# Patient Record
Sex: Male | Born: 1955
Health system: Southern US, Community
[De-identification: ages and names within clinical notes are randomized; demographics above are authoritative.]

## PROBLEM LIST (undated history)

## (undated) DIAGNOSIS — G4730 Sleep apnea, unspecified: Secondary | ICD-10-CM

## (undated) DIAGNOSIS — Z9989 Dependence on other enabling machines and devices: Secondary | ICD-10-CM

## (undated) DIAGNOSIS — I700 Atherosclerosis of aorta: Secondary | ICD-10-CM

## (undated) DIAGNOSIS — I4891 Unspecified atrial fibrillation: Secondary | ICD-10-CM

## (undated) DIAGNOSIS — I499 Cardiac arrhythmia, unspecified: Secondary | ICD-10-CM

## (undated) DIAGNOSIS — K219 Gastro-esophageal reflux disease without esophagitis: Secondary | ICD-10-CM

## (undated) DIAGNOSIS — N529 Male erectile dysfunction, unspecified: Secondary | ICD-10-CM

## (undated) DIAGNOSIS — I10 Essential (primary) hypertension: Secondary | ICD-10-CM

## (undated) DIAGNOSIS — M1990 Unspecified osteoarthritis, unspecified site: Secondary | ICD-10-CM

## (undated) DIAGNOSIS — E785 Hyperlipidemia, unspecified: Secondary | ICD-10-CM

## (undated) DIAGNOSIS — T7840XA Allergy, unspecified, initial encounter: Secondary | ICD-10-CM

## (undated) DIAGNOSIS — R011 Cardiac murmur, unspecified: Secondary | ICD-10-CM

## (undated) HISTORY — DX: Gastro-esophageal reflux disease without esophagitis: K21.9

## (undated) HISTORY — DX: Dependence on other enabling machines and devices: Z99.89

## (undated) HISTORY — DX: Unspecified atrial fibrillation: I48.91

## (undated) HISTORY — DX: Sleep apnea, unspecified: G47.30

## (undated) HISTORY — PX: VARICOCELE EXCISION: SUR582

## (undated) HISTORY — DX: Cardiac murmur, unspecified: R01.1

## (undated) HISTORY — DX: Hyperlipidemia, unspecified: E78.5

## (undated) HISTORY — DX: Atherosclerosis of aorta: I70.0

## (undated) HISTORY — DX: Male erectile dysfunction, unspecified: N52.9

## (undated) HISTORY — DX: Unspecified osteoarthritis, unspecified site: M19.90

## (undated) HISTORY — DX: Allergy, unspecified, initial encounter: T78.40XA

## (undated) HISTORY — DX: Essential (primary) hypertension: I10

---

## 2005-07-14 ENCOUNTER — Ambulatory Visit: Payer: Self-pay | Admitting: Gastroenterology

## 2005-07-14 LAB — HM COLONOSCOPY

## 2009-06-16 ENCOUNTER — Ambulatory Visit: Payer: Self-pay | Admitting: Family Medicine

## 2009-06-16 ENCOUNTER — Emergency Department: Payer: Self-pay | Admitting: Emergency Medicine

## 2009-06-23 ENCOUNTER — Ambulatory Visit: Payer: Self-pay | Admitting: Internal Medicine

## 2010-12-02 ENCOUNTER — Ambulatory Visit: Payer: Self-pay | Admitting: Gastroenterology

## 2012-04-20 ENCOUNTER — Ambulatory Visit: Payer: Self-pay | Admitting: Orthopedic Surgery

## 2012-08-31 ENCOUNTER — Inpatient Hospital Stay: Payer: Self-pay | Admitting: Internal Medicine

## 2012-08-31 LAB — MAGNESIUM: Magnesium: 1.9 mg/dL

## 2012-08-31 LAB — CBC
HCT: 45.5 % (ref 40.0–52.0)
HGB: 15.4 g/dL (ref 13.0–18.0)
MCH: 30.2 pg (ref 26.0–34.0)
MCHC: 33.8 g/dL (ref 32.0–36.0)
MCV: 89 fL (ref 80–100)
Platelet: 156 10*3/uL (ref 150–440)
RBC: 5.11 10*6/uL (ref 4.40–5.90)
RDW: 13.2 % (ref 11.5–14.5)
WBC: 5.5 10*3/uL (ref 3.8–10.6)

## 2012-08-31 LAB — URINALYSIS, COMPLETE
Bacteria: NONE SEEN
Bilirubin,UR: NEGATIVE
Glucose,UR: NEGATIVE mg/dL (ref 0–75)
Ketone: NEGATIVE
Nitrite: NEGATIVE
Ph: 5 (ref 4.5–8.0)
Protein: NEGATIVE
RBC,UR: 2 /HPF (ref 0–5)
Specific Gravity: 1.009 (ref 1.003–1.030)
Squamous Epithelial: <1
WBC UR: 6 /HPF (ref 0–5)

## 2012-08-31 LAB — COMPREHENSIVE METABOLIC PANEL
Albumin: 3.9 g/dL (ref 3.4–5.0)
Alkaline Phosphatase: 103 U/L (ref 50–136)
Anion Gap: 5 — ABNORMAL LOW (ref 7–16)
BUN: 16 mg/dL (ref 7–18)
Bilirubin,Total: 0.8 mg/dL (ref 0.2–1.0)
Calcium, Total: 8.7 mg/dL (ref 8.5–10.1)
Chloride: 107 mmol/L (ref 98–107)
Co2: 27 mmol/L (ref 21–32)
Creatinine: 1.25 mg/dL (ref 0.60–1.30)
EGFR (African American): >60
EGFR (Non-African Amer.): >60
Glucose: 128 mg/dL — ABNORMAL HIGH (ref 65–99)
Osmolality: 280 (ref 275–301)
Potassium: 3.6 mmol/L (ref 3.5–5.1)
SGOT(AST): 29 U/L (ref 15–37)
SGPT (ALT): 48 U/L (ref 12–78)
Sodium: 139 mmol/L (ref 136–145)
Total Protein: 7.6 g/dL (ref 6.4–8.2)

## 2012-08-31 LAB — PHOSPHORUS: Phosphorus: 2.8 mg/dL (ref 2.5–4.9)

## 2012-08-31 LAB — TROPONIN I
Troponin-I: <0.02 ng/mL
Troponin-I: <0.02 ng/mL

## 2012-08-31 LAB — TSH: Thyroid Stimulating Horm: 1.57 u[IU]/mL

## 2012-08-31 LAB — CK TOTAL AND CKMB (NOT AT ARMC)
CK, Total: 77 U/L (ref 35–232)
CK-MB: <0.5 ng/mL — ABNORMAL LOW (ref 0.5–3.6)

## 2012-08-31 LAB — PROTIME-INR
INR: 1.1
Prothrombin Time: 13.9 secs (ref 11.5–14.7)

## 2012-09-01 LAB — LIPID PANEL
Cholesterol: 142 mg/dL (ref 0–200)
HDL Cholesterol: 29 mg/dL — ABNORMAL LOW (ref 40–60)
Ldl Cholesterol, Calc: 90 mg/dL (ref 0–100)
Triglycerides: 116 mg/dL (ref 0–200)
VLDL Cholesterol, Calc: 23 mg/dL (ref 5–40)

## 2012-09-01 LAB — CBC WITH DIFFERENTIAL/PLATELET
Basophil #: 0.1 10*3/uL (ref 0.0–0.1)
Basophil %: 0.9 %
Eosinophil #: 0.2 10*3/uL (ref 0.0–0.7)
Eosinophil %: 2.9 %
HCT: 44.4 % (ref 40.0–52.0)
HGB: 15 g/dL (ref 13.0–18.0)
Lymphocyte #: 2.1 10*3/uL (ref 1.0–3.6)
Lymphocyte %: 33.1 %
MCH: 30.3 pg (ref 26.0–34.0)
MCHC: 33.8 g/dL (ref 32.0–36.0)
MCV: 90 fL (ref 80–100)
Monocyte #: 0.8 x10 3/mm (ref 0.2–1.0)
Monocyte %: 13.3 %
Neutrophil #: 3.1 10*3/uL (ref 1.4–6.5)
Neutrophil %: 49.8 %
Platelet: 165 10*3/uL (ref 150–440)
RBC: 4.96 10*6/uL (ref 4.40–5.90)
RDW: 13 % (ref 11.5–14.5)
WBC: 6.3 10*3/uL (ref 3.8–10.6)

## 2012-09-01 LAB — BASIC METABOLIC PANEL
Anion Gap: 6 — ABNORMAL LOW (ref 7–16)
BUN: 17 mg/dL (ref 7–18)
Calcium, Total: 8.7 mg/dL (ref 8.5–10.1)
Chloride: 106 mmol/L (ref 98–107)
Co2: 27 mmol/L (ref 21–32)
Creatinine: 1.19 mg/dL (ref 0.60–1.30)
EGFR (African American): >60
EGFR (Non-African Amer.): >60
Glucose: 108 mg/dL — ABNORMAL HIGH (ref 65–99)
Osmolality: 280 (ref 275–301)
Potassium: 3.9 mmol/L (ref 3.5–5.1)
Sodium: 139 mmol/L (ref 136–145)

## 2012-09-01 LAB — MAGNESIUM: Magnesium: 2 mg/dL

## 2012-09-01 LAB — TROPONIN I: Troponin-I: <0.02 ng/mL

## 2013-02-07 DIAGNOSIS — E785 Hyperlipidemia, unspecified: Secondary | ICD-10-CM | POA: Insufficient documentation

## 2013-02-07 DIAGNOSIS — R030 Elevated blood-pressure reading, without diagnosis of hypertension: Secondary | ICD-10-CM | POA: Insufficient documentation

## 2013-02-07 DIAGNOSIS — K219 Gastro-esophageal reflux disease without esophagitis: Secondary | ICD-10-CM | POA: Insufficient documentation

## 2013-02-18 HISTORY — PX: ABLATION: SHX5711

## 2013-03-12 DIAGNOSIS — G4733 Obstructive sleep apnea (adult) (pediatric): Secondary | ICD-10-CM | POA: Insufficient documentation

## 2014-06-14 ENCOUNTER — Ambulatory Visit: Payer: Self-pay | Admitting: Emergency Medicine

## 2014-07-16 LAB — PSA

## 2014-10-10 NOTE — Discharge Summary (Signed)
PATIENT NAME:  Thomas Mercado, Thomas Mercado MR#:  [id number] DATE OF BIRTH:  May 10, 1956  DATE OF ADMISSION:  08/31/2012 DATE OF DISCHARGE:  09/02/2012  PRIMARY CARE PHYSICIAN:  Dr. Golden Pop.   PRIMARY CARDIOLOGIST: Dr. Nehemiah Massed.   DISCHARGE DIAGNOSES: 1.  Atrial fibrillation/flutter with rapid ventricular rate.  2.  Hypertension.   CONSULTANTS: Dr. Ubaldo Glassing of cardiology.   IMAGING STUDIES: Include a chest x-ray showed no acute cardiopulmonary disease.   ADMITTING HISTORY AND PHYSICAL: Please see detailed H and P dictated on 08/31/2012. In brief, this is a 59 year old male patient with past history of atrial fibrillation, bradycardia, who presented to the hospital complaining of lightheadedness and dizziness. He was found to have rapid ventricular rate at 80 with A. fib/A. flutter. He was admitted to the hospitalist service on a Cardizem drip to CCU.    HOSPITAL COURSE:  Atrial fibrillation/flutter. Patient was continued on a Cardizem drip, later transitioned to Cardizem 120 mg oral once a day. Reviewing his outpatient records with Dr. Ubaldo Glassing, it was found that he had episodes of bradycardia on Cardizem of 240, but presently the patient has been started on Cardizem 120 and he has no episodes of bradycardia. On further discussion with Dr. Ubaldo Glassing, the patient is being discharged home on a Holter for 48 hours on flecainide  and Cardizem to follow up with Dr. Nehemiah Massed. On the day of discharge, the patient was ambulated on telemetry, with his heart rate being less than 110 and is being discharged home in fair condition. His CHADS score is zero and he is on aspirin for his anticoagulation.   On the day of discharge, patient's cardiac examination showed irregular heart rate, with blood pressure 132/89, pulse 74 at rest and saturating 98% on room air.   DISCHARGE MEDICATIONS: 1.  Aspirin 81 mg oral once a day.  2.  Flonase 50 mcg two sprays nasal once a day.  3.  Nexium 40 mg oral once a day.  4.  Flecainide 100 mg  oral 2 times a day.  5.  Pravastatin 20 mg oral once a day.  6.  Diltiazem 120 mg oral once a day long-acting.   DISCHARGE INSTRUCTIONS:  Cardiac diet. Activity as tolerated. Follow up with Dr. Nehemiah Massed in 1 to 2 days as outpatient.   TIME SPENT: On day of discharge in discharge activity was 42 minutes.    ____________________________ Leia Alf Karessa Onorato, MD srs:cc D: 09/02/2012 13:59:59 ET T: 09/02/2012 22:33:40 ET JOB#: [id number]  cc: Alveta Heimlich R. Darvin Neighbours, MD, <Dictator> Corey Skains, MD Guadalupe Maple, MD Neita Carp MD ELECTRONICALLY SIGNED 09/06/2012 13:20

## 2014-10-10 NOTE — H&P (Signed)
PATIENT NAME:  Thomas Mercado, Thomas Mercado MR#:  [id number] DATE OF BIRTH:  01-06-1956  DATE OF ADMISSION:  08/31/2012  PRIMARY CARE PHYSICIAN: Dr. Golden Pop.   PRIMARY CARDIOLOGIST: Dr. Serafina Royals.   PRESENTING COMPLAINT: Lightheaded and dizziness   HISTORY OF PRESENTING ILLNESS: This is a 59 year old male with past medical history of atrial fibrillation, sinus allergies, reflux disease, hypercholesterolemia. Has been diagnosed with atrial fibrillation almost 2 years ago when he was feeling weak and dizzy and followed with cardiologist, Dr. Nehemiah Massed, who initially put him on some kind of rate control medication, but then he still had complaint of feeling dizzy a few weeks ago, so he did a Holter monitor and he found his heart is going sometimes brady and sometimes tachycardic and so started on flecainide 3 weeks ago. Yesterday in the night while the patient was walking from one room to the other, he felt extremely lightheaded and fell down. He never lost consciousness. He just tried to get up and started walking again. There was not any complaint of palpitations, chest pain or loss of consciousness, feeling focal weakness, tingling, numbness or tremors with this episode. He decided to go to his primary care physician today because he was feeling lightheaded. He went to the physician's office and they were unable to check his blood pressure, as it was running very low and his heart was fast, so they sent him to Emergency Room. On arrival to ER, he was found having atrial flutter with ventricular response of up to 130 to 140. Gave him IV diltiazem, 2 doses, but still were unable to control his heart rate and so finally started him on diltiazem IV drip, and he is being admitted for further management.   REVIEW OF SYSTEMS:  CONSTITUTIONAL: Negative for fever, fatigue, weakness, pain or weight loss. Positive for lightheadedness. EYES: No blurring, double vision, pain or redness.  EARS, NOSE, THROAT: No tinnitus,  ear pain, hearing loss or discharge.  RESPIRATORY: No cough, wheezing, hemoptysis.  CARDIOVASCULAR: No chest pain, orthopnea, edema or palpitations, but had episode of  presyncopal event and lightheadedness.  GASTROINTESTINAL: No nausea, vomiting, diarrhea, abdominal pain or constipation.  GENITOURINARY: No dysuria, hematuria, or increased frequency of the urination.  ENDOCRINE: No heat or cold intolerance.  SKIN: No rashes.  MUSCULOSKELETAL: No pain or swelling in the joints.  NEUROLOGICAL: No numbness, weakness, tremors, but had lightheadedness and episode of fall once.  PSYCHIATRIC: No anxiety, insomnia or depression.   PAST MEDICAL HISTORY: Atrial fibrillation, hyperlipidemia, reflux disease and allergic sinusitis.   PAST SURGICAL HISTORY: None.   SOCIAL HISTORY: Works as a Freight forwarder in [employer]. He denies smoking. Drinking alcohol occasionally. No illegal drug use.   FAMILY HISTORY: Positive for breast cancer in mother. High blood pressure, rheumatoid arthritis and Alzheimer's in father. Brother has emphysema.   HOME MEDICATIONS: Aspirin 81 mg once a day, Flonase 50 mcg 2 sprays once a day, Nexium 40 mg once a day, flecainide 100 mg twice daily, pravastatin 20 mg once a day.  ALLERGIES: PENICILLIN.  PHYSICAL EXAMINATION:  VITAL SIGNS: Temperature 97.3, pulse rate 150 to 130, blood pressure 115/90 and pulse oximetry 96 on room air.  GENERAL: Fully alert, oriented to time, place and person, in no acute distress. He is cooperative with history taking and physical examination.  HEENT: Head and neck atraumatic. Conjunctivae pink. Oral mucosa moist.  NECK: Supple. No JVD.  CARDIOVASCULAR: S1, S2 present. Irregular and tachycardia. No murmur appreciated due to tachycardia.  RESPIRATORY: Bilateral clear  and equal air entry.  ABDOMEN: Soft, nontender. Bowel sounds present. No organomegaly.  SKIN: No rashes.  MUSCULOSKELETAL: Joints: No swelling or tenderness.  EXTREMITIES: Legs: No  edema.  NEUROLOGICAL: Power 5/5 all 4 limbs. Follows commands. No tremor.  PSYCHIATRIC: Does not appear in any acute psychiatric illness at this time.   LABORATORY, DIAGNOSTIC AND RADIOLOGICAL DATA: Glucose 128, BUN 16, creatinine 1.25, sodium 139, potassium 3.6, chloride 107, CO2 of 27, phosphorus 2.8, magnesium 1.9, calcium 8.7. CK 77, CK-MB less than 0.05, troponin less than 0.02. Thyroid-stimulating hormone 1.57. WBC 5.5, hemoglobin 15.4, platelet count 156, MCV 89. INR 1.1. Chest x-ray: No acute cardiopulmonary abnormality.   ASSESSMENT AND PLAN: A 59 year old male with known history of atrial fibrillation and hyperlipidemia, on flecainide, came after having lightheadedness and fall once and went to primary medical doctor's office, found having hypotension and tachycardia with atrial fibrillation and sent to Emergency Room.  1.  Atrial fibrillation with ventricular response: Emergency Room physician tried giving diltiazem IV push x 2 but no response. He was still tachycardic to 120 to 130 in atrial fibrillation, so started on diltiazem drip now. Will admit him in Critical Care Unit with diltiazem drip and monitor for his cardiac rhythm. He was on flecainide by Dr. Nehemiah Massed, but will wait for cardiology to come and decide what medicine they want to start him on now once the heart rate comes under control. His CHADS2 score is low, so he is not a candidate for anticoagulation at this time. Troponin level is negative. Will follow troponin x 3. His cardiac workup with echocardiogram and stress test as per the patient was done with Dr. Nehemiah Massed in the clinic, so I will not order any further workup at this time, wait for the cardiology input and what they want to do.  2.  Hyperlipidemia: Will resume pravastatin.  3.  Gastroesophageal reflux disease: Will continue him on pantoprazole tablet.  4.  Sinus allergic reactions: Will continue him on Flonase nasal spray. 5.  Deep vein thrombosis prophylaxis with  heparin subcutaneous. Gastrointestinal prophylaxis with pantoprazole oral.   CODE STATUS: Full code.   TOTAL TIME SPENT IN CRITICAL CARE: 50 minutes.   ____________________________ Ceasar Lund Anselm Jungling, MD [id number] D: 08/31/2012 16:55:50 ET T: 08/31/2012 19:08:27 ET JOB#: [id number]  cc: Ceasar Lund. Anselm Jungling, MD, <Dictator> Guadalupe Maple, MD Corey Skains, MD Vaughan Basta MD ELECTRONICALLY SIGNED 09/17/2012 9:34

## 2014-10-10 NOTE — Consult Note (Signed)
   General Aspect 59 yo male with history of afib who presented to his pcp with complaints of sinusitis and was noted to be in afib/flutter with rvr. He had been treated with cardizem cd 240 mg daily as an outpatient with appartent conversion to sinus rhythm but also had episodes of bradycardia. He was taken off of the cardizem and placed on flecainide at 100 mg twice daily. He was noted to have atrial flutter on monitor. He was placed on a cardizem drip after several bouses. He has ruled out for an mi and is hemodynamically stable. This am he is intermitantly in aflutter with 2:1 conduction, afib with controlled vr  and sinus rhythm at rate of 60. He is asymptomatic and hemodynamically stable. He is on asa. His CHADSS score is 0.   Physical Exam:  GEN well developed, well nourished, no acute distress   HEENT PERRL, hearing intact to voice   NECK supple   RESP normal resp effort  clear BS   CARD Irregular rate and rhythm  No murmur   ABD denies tenderness  no liver/spleen enlargement   LYMPH negative neck, negative axillae   EXTR negative cyanosis/clubbing, negative edema   SKIN normal to palpation   NEURO cranial nerves intact, motor/sensory function intact   PSYCH A+O to time, place, person   Review of Systems:  Subjective/Chief Complaint sinus congestion   General: No Complaints   Skin: No Complaints   ENT: No Complaints   Eyes: No Complaints   Neck: No Complaints   Respiratory: No Complaints   Cardiovascular: Palpitations   Gastrointestinal: No Complaints   Genitourinary: No Complaints   Vascular: No Complaints   Musculoskeletal: No Complaints   Neurologic: No Complaints   Hematologic: No Complaints   Endocrine: No Complaints   Psychiatric: No Complaints   Review of Systems: All other systems were reviewed and found to be negative   Medications/Allergies Reviewed Medications/Allergies reviewed   EKG:  Interpretation aflutter/afib/nsr     Penicillin: Unknown   Impression 59 yo male with history of afib admitted with aflutter with rvr. Has been on a cardizem drip and has converted to afib with variable response and intermittant nsr. Is on flecainide 100 bid and rate is controlled with cardizem. Had bradycardia with cardizem 240 mg daiily in the past. Will wean off of iv cardizem to cardizem xt 120 mg daily. Continue with asa. Ambualte today and if stable consider discharge on flecainide 100 bid; cardizem cd 120 mg daily; cardizem 30 mg po prn breakthrough and asa. Follow up with dr Nehemiah Massed next week as scheduled.   Plan 1. Wean off of cardizem drip 2. Cardizem cd 120 mg daily 3. Cardizem 30 mg prn breakthrough 4. Continue with asa 81 mg daily 5. Ambulate today and if stable discharge with 48 hour holter monitor 6. Follow up with dr Nehemiah Massed on tuesday of next week as scheduled   Electronic Signatures: Teodoro Spray (MD)  (Signed 15-Mar-14 09:23)  Authored: General Aspect/Present Illness, History and Physical Exam, Review of System, EKG , Allergies, Impression/Plan   Last Updated: 15-Mar-14 09:23 by Teodoro Spray (MD)

## 2015-01-29 ENCOUNTER — Other Ambulatory Visit: Payer: Self-pay | Admitting: Orthopedic Surgery

## 2015-01-29 DIAGNOSIS — M25562 Pain in left knee: Secondary | ICD-10-CM

## 2015-02-03 ENCOUNTER — Ambulatory Visit
Admission: RE | Admit: 2015-02-03 | Discharge: 2015-02-03 | Disposition: A | Discharge status: Home / Self Care | Payer: Managed Care, Other (non HMO) | Source: Ambulatory Visit | Attending: Orthopedic Surgery | Admitting: Orthopedic Surgery

## 2015-02-03 DIAGNOSIS — M1712 Unilateral primary osteoarthritis, left knee: Secondary | ICD-10-CM | POA: Insufficient documentation

## 2015-02-03 DIAGNOSIS — X58XXXA Exposure to other specified factors, initial encounter: Secondary | ICD-10-CM | POA: Insufficient documentation

## 2015-02-03 DIAGNOSIS — S83242A Other tear of medial meniscus, current injury, left knee, initial encounter: Secondary | ICD-10-CM | POA: Diagnosis not present

## 2015-02-03 DIAGNOSIS — M25562 Pain in left knee: Secondary | ICD-10-CM

## 2015-02-23 ENCOUNTER — Other Ambulatory Visit: Payer: Self-pay | Admitting: Family Medicine

## 2015-02-23 NOTE — Telephone Encounter (Signed)
Needs seen further refills 

## 2015-03-09 NOTE — Telephone Encounter (Signed)
Pt scheduled for med fu on 03/11/15 @ 10:30. Thanks.

## 2015-03-10 DIAGNOSIS — J309 Allergic rhinitis, unspecified: Secondary | ICD-10-CM | POA: Insufficient documentation

## 2015-03-10 DIAGNOSIS — N529 Male erectile dysfunction, unspecified: Secondary | ICD-10-CM | POA: Insufficient documentation

## 2015-03-10 DIAGNOSIS — I4891 Unspecified atrial fibrillation: Secondary | ICD-10-CM | POA: Insufficient documentation

## 2015-03-10 DIAGNOSIS — K760 Fatty (change of) liver, not elsewhere classified: Secondary | ICD-10-CM

## 2015-03-10 DIAGNOSIS — R011 Cardiac murmur, unspecified: Secondary | ICD-10-CM | POA: Insufficient documentation

## 2015-03-10 DIAGNOSIS — I10 Essential (primary) hypertension: Secondary | ICD-10-CM

## 2015-03-10 DIAGNOSIS — E291 Testicular hypofunction: Secondary | ICD-10-CM | POA: Insufficient documentation

## 2015-03-11 ENCOUNTER — Ambulatory Visit (INDEPENDENT_AMBULATORY_CARE_PROVIDER_SITE_OTHER): Payer: Managed Care, Other (non HMO) | Admitting: Unknown Physician Specialty

## 2015-03-11 ENCOUNTER — Encounter: Payer: Self-pay | Admitting: Unknown Physician Specialty

## 2015-03-11 VITALS — BP 136/94 | HR 79 | Temp 98.3°F | Ht 75.1 in | Wt 274.4 lb

## 2015-03-11 DIAGNOSIS — K219 Gastro-esophageal reflux disease without esophagitis: Secondary | ICD-10-CM | POA: Diagnosis not present

## 2015-03-11 DIAGNOSIS — Z23 Encounter for immunization: Secondary | ICD-10-CM

## 2015-03-11 DIAGNOSIS — K760 Fatty (change of) liver, not elsewhere classified: Secondary | ICD-10-CM

## 2015-03-11 DIAGNOSIS — J309 Allergic rhinitis, unspecified: Secondary | ICD-10-CM | POA: Diagnosis not present

## 2015-03-11 DIAGNOSIS — E785 Hyperlipidemia, unspecified: Secondary | ICD-10-CM | POA: Diagnosis not present

## 2015-03-11 MED ORDER — FLUTICASONE PROPIONATE 50 MCG/ACT NA SUSP: 2.0000 | Freq: Every day | NASAL | Status: DC

## 2015-03-11 MED ORDER — PRAVASTATIN SODIUM 20 MG PO TABS: 20.0000 mg | ORAL_TABLET | Freq: Every day | ORAL | Status: DC

## 2015-03-11 MED ORDER — ESOMEPRAZOLE MAGNESIUM 40 MG PO CPDR: 40.0000 mg | DELAYED_RELEASE_CAPSULE | Freq: Every day | ORAL | Status: DC

## 2015-03-11 NOTE — Assessment & Plan Note (Signed)
Check CMP.  ?

## 2015-03-11 NOTE — Assessment & Plan Note (Signed)
Check lipid panel  

## 2015-03-11 NOTE — Progress Notes (Signed)
BP 136/94 mmHg  Pulse 79  Temp(Src) 98.3 F (36.8 C)  Ht 6' 3.1" (1.908 m)  Wt 274 lb 6.4 oz (124.467 kg)  BMI 34.19 kg/m2  SpO2 94%   Subjective:    Patient ID: Thomas Mercado, male    DOB: 1956-05-11, 59 y.o.   MRN: [id number]  HPI: Thomas Mercado is a 59 y.o. male  Chief Complaint  Patient presents with  . Hyperlipidemia  . Hypertension   Hyperlipidemia This is a chronic problem. The problem is controlled. Exacerbating diseases include obesity. He has no history of diabetes or hypothyroidism. elevated liver enzymes. There are no known factors aggravating his hyperlipidemia. Pertinent negatives include no focal sensory loss, focal weakness, leg pain or myalgias. Current antihyperlipidemic treatment includes statins. There are no compliance problems.    Allergies Takes Flonase only in heavy allergy season.  Fluticasone daily mainly for nasal congestion.    GERD Takes Nexium 40 mg with good control of symptoms. States he gets breakthrough symptoms.   Pt is making diet changes with a friend.  He is trying to cut back on sweet tea.    Relevant past medical, surgical, family and social history reviewed and updated as indicated. Interim medical history since our last visit reviewed. Allergies and medications reviewed and updated.  Review of Systems  Musculoskeletal: Negative for myalgias.  Neurological: Negative for focal weakness.    Per HPI unless specifically indicated above     Objective:    BP 136/94 mmHg  Pulse 79  Temp(Src) 98.3 F (36.8 C)  Ht 6' 3.1" (1.908 m)  Wt 274 lb 6.4 oz (124.467 kg)  BMI 34.19 kg/m2  SpO2 94%  Wt Readings from Last 3 Encounters:  03/11/15 274 lb 6.4 oz (124.467 kg)  07/16/14 273 lb (123.832 kg)    Physical Exam  Constitutional: He is oriented to person, place, and time. He appears well-developed and well-nourished. No distress.  HENT:  Head: Normocephalic and atraumatic.  Eyes: Conjunctivae and lids are normal. Right eye exhibits  no discharge. Left eye exhibits no discharge. No scleral icterus.  Cardiovascular: Normal rate, regular rhythm and normal heart sounds.   Pulmonary/Chest: Effort normal and breath sounds normal. No respiratory distress.  Abdominal: Normal appearance and bowel sounds are normal. He exhibits no distension. There is no splenomegaly or hepatomegaly. There is no tenderness.  Musculoskeletal: Normal range of motion.  Neurological: He is alert and oriented to person, place, and time.  Skin: Skin is intact. No rash noted. No pallor.  Psychiatric: He has a normal mood and affect. His behavior is normal. Judgment and thought content normal.   Noted elevated AST/ALT     Assessment & Plan:   Problem List Items Addressed This Visit      Unprioritized   Acid reflux    Stable, continue present medications. Discuss weaning off in the future      Relevant Medications   esomeprazole (NEXIUM) 40 MG capsule   HLD (hyperlipidemia)    Check lipid panel      Relevant Medications   pravastatin (PRAVACHOL) 20 MG tablet   Other Relevant Orders   Lipid Panel w/o Chol/HDL Ratio   Allergic rhinitis    Stable, continue present medications.       Non-alcoholic fatty liver disease    Check CMP      Relevant Orders   Comprehensive metabolic panel    Other Visit Diagnoses    Immunization due    -  Primary  Relevant Orders    Flu Vaccine QUAD 36+ mos PF IM (Fluarix & Fluzone Quad PF) (Completed)        Follow up plan: Return in about 6 months (around 09/08/2015) for physical.

## 2015-03-11 NOTE — Assessment & Plan Note (Signed)
Stable, continue present medications.   

## 2015-03-11 NOTE — Assessment & Plan Note (Signed)
Stable, continue present medications. Discuss weaning off in the future

## 2015-03-12 LAB — COMPREHENSIVE METABOLIC PANEL
ALT: 134 IU/L — ABNORMAL HIGH (ref 0–44)
AST: 86 IU/L — ABNORMAL HIGH (ref 0–40)
Albumin/Globulin Ratio: 2 (ref 1.1–2.5)
Albumin: 4.7 g/dL (ref 3.6–4.8)
Alkaline Phosphatase: 100 IU/L (ref 39–117)
BUN/Creatinine Ratio: 21 (ref 10–22)
BUN: 20 mg/dL (ref 8–27)
Bilirubin Total: 0.7 mg/dL (ref 0.0–1.2)
CO2: 26 mmol/L (ref 18–29)
Calcium: 9.7 mg/dL (ref 8.6–10.2)
Chloride: 100 mmol/L (ref 97–108)
Creatinine, Ser: 0.95 mg/dL (ref 0.76–1.27)
GFR calc Af Amer: 100 mL/min/{1.73_m2} (ref >59)
GFR calc non Af Amer: 87 mL/min/{1.73_m2} (ref >59)
Globulin, Total: 2.4 g/dL (ref 1.5–4.5)
Glucose: 88 mg/dL (ref 65–99)
Potassium: 4.8 mmol/L (ref 3.5–5.2)
Sodium: 139 mmol/L (ref 134–144)
Total Protein: 7.1 g/dL (ref 6.0–8.5)

## 2015-03-12 LAB — LIPID PANEL W/O CHOL/HDL RATIO
Cholesterol, Total: 161 mg/dL (ref 100–199)
HDL: 38 mg/dL — ABNORMAL LOW (ref >39)
LDL Calculated: 100 mg/dL — ABNORMAL HIGH (ref 0–99)
Triglycerides: 116 mg/dL (ref 0–149)
VLDL Cholesterol Cal: 23 mg/dL (ref 5–40)

## 2015-03-31 ENCOUNTER — Encounter
Admission: RE | Admit: 2015-03-31 | Discharge: 2015-03-31 | Disposition: A | Discharge status: Home / Self Care | Payer: Managed Care, Other (non HMO) | Source: Ambulatory Visit | Attending: Orthopedic Surgery | Admitting: Orthopedic Surgery

## 2015-03-31 DIAGNOSIS — Z01818 Encounter for other preprocedural examination: Secondary | ICD-10-CM | POA: Insufficient documentation

## 2015-03-31 DIAGNOSIS — S83242A Other tear of medial meniscus, current injury, left knee, initial encounter: Secondary | ICD-10-CM | POA: Insufficient documentation

## 2015-03-31 LAB — APTT: aPTT: 28 seconds (ref 24–36)

## 2015-03-31 LAB — BASIC METABOLIC PANEL
Anion gap: 8 (ref 5–15)
BUN: 18 mg/dL (ref 6–20)
CO2: 27 mmol/L (ref 22–32)
Calcium: 9.8 mg/dL (ref 8.9–10.3)
Chloride: 105 mmol/L (ref 101–111)
Creatinine, Ser: 1 mg/dL (ref 0.61–1.24)
GFR calc Af Amer: >60 mL/min (ref >60)
GFR calc non Af Amer: >60 mL/min (ref >60)
Glucose, Bld: 89 mg/dL (ref 65–99)
Potassium: 4.3 mmol/L (ref 3.5–5.1)
Sodium: 140 mmol/L (ref 135–145)

## 2015-03-31 LAB — URINALYSIS COMPLETE WITH MICROSCOPIC (ARMC ONLY)
Bacteria, UA: NONE SEEN
Bilirubin Urine: NEGATIVE
Glucose, UA: NEGATIVE mg/dL
Hgb urine dipstick: NEGATIVE
Ketones, ur: NEGATIVE mg/dL
Leukocytes, UA: NEGATIVE
Nitrite: NEGATIVE
Protein, ur: NEGATIVE mg/dL
Specific Gravity, Urine: 1.01 (ref 1.005–1.030)
pH: 6 (ref 5.0–8.0)

## 2015-03-31 LAB — PROTIME-INR
INR: 1.07
Prothrombin Time: 14.1 seconds (ref 11.4–15.0)

## 2015-03-31 LAB — CBC
HCT: 44.7 % (ref 40.0–52.0)
Hemoglobin: 15.1 g/dL (ref 13.0–18.0)
MCH: 30 pg (ref 26.0–34.0)
MCHC: 33.8 g/dL (ref 32.0–36.0)
MCV: 88.7 fL (ref 80.0–100.0)
Platelets: 196 10*3/uL (ref 150–440)
RBC: 5.04 MIL/uL (ref 4.40–5.90)
RDW: 13.2 % (ref 11.5–14.5)
WBC: 5.6 10*3/uL (ref 3.8–10.6)

## 2015-03-31 NOTE — Patient Instructions (Signed)
  Your procedure is scheduled on: Thursday April 09, 2015. Report to Same Day Surgery. To find out your arrival time please call [phone] between 1PM - 3PM on Wednesday 19, 2016.  Remember: Instructions that are not followed completely may result in serious medical risk, up to and including death, or upon the discretion of your surgeon and anesthesiologist your surgery may need to be rescheduled.    _x___ 1. Do not eat food or drink liquids after midnight. No gum chewing or hard candies.     ____ 2. No Alcohol for 24 hours before or after surgery.   ____ 3. Bring all medications with you on the day of surgery if instructed.    __x__ 4. Notify your doctor if there is any change in your medical condition     (cold, fever, infections).     Do not wear jewelry, make-up, hairpins, clips or nail polish.  Do not wear lotions, powders, or perfumes. You may wear deodorant.  Do not shave 48 hours prior to surgery. Men may shave face and neck.  Do not bring valuables to the hospital.    [facility] is not responsible for any belongings or valuables.               Contacts, dentures or bridgework may not be worn into surgery.  Leave your suitcase in the car. After surgery it may be brought to your room.  For patients admitted to the hospital, discharge time is determined by your treatment team.   Patients discharged the day of surgery will not be allowed to drive home.    Please read over the following fact sheets that you were given:   [facility] Preparing for Surgery  _x___ Take these medicines the morning of surgery with A SIP OF WATER:    1. esomeprazole ([place])    ____ Fleet Enema (as directed)   _x_ Use CHG Soap as directed  ____ Use inhalers on the day of surgery  ____ Stop metformin 2 days prior to surgery    ____ Take 1/2 of usual insulin dose the night before surgery and none on the morning of surgery.   __x__ Check with Dr. Mack Guise if and when to stop daily  Aspirin.  __x__ Stop Anti-inflammatories 1 week prior to surgery.   ____ Stop supplements until after surgery.    __x__ Bring C-Pap to the hospital.

## 2015-03-31 NOTE — Pre-Procedure Instructions (Signed)
Dr. Harden Mo office called, yes he does want the Met B run that was drawn today.

## 2015-03-31 NOTE — Pre-Procedure Instructions (Addendum)
Pt recently had Met C done at his primary care doctor's office 03/11/15, results WNL.  Contacted Dr. Harden Mo office to see if Met B needed to be drawn today, spoke with Otis Peak, she will check with Dr. Mack Guise via text, he is out of office until 3pm. She will call back when she hears from Dr. Mack Guise.  We will draw and hold Met B.  Pt reports he has an appointment to see Dr. Mack Guise this week, instructed pt to ask Dr. Mack Guise if and when he needs to stop his Aspirin prior to surgery.  Pt also reported he has received a cardiac clearance from Dr. Ubaldo Glassing.

## 2015-04-06 ENCOUNTER — Encounter: Payer: Self-pay | Admitting: Family Medicine

## 2015-04-06 ENCOUNTER — Ambulatory Visit (INDEPENDENT_AMBULATORY_CARE_PROVIDER_SITE_OTHER): Payer: Managed Care, Other (non HMO) | Admitting: Family Medicine

## 2015-04-06 VITALS — BP 139/86 | HR 79 | Temp 97.7°F | Ht 73.8 in | Wt 261.0 lb

## 2015-04-06 DIAGNOSIS — M25562 Pain in left knee: Secondary | ICD-10-CM | POA: Insufficient documentation

## 2015-04-06 NOTE — Progress Notes (Signed)
BP 139/86 mmHg  Pulse 79  Temp(Src) 97.7 F (36.5 C)  Ht 6' 1.8" (1.875 m)  Wt 261 lb (118.389 kg)  BMI 33.68 kg/m2  SpO2 97%   Subjective:    Patient ID: Thomas Mercado, male    DOB: [birth year], 59 y.o.   MRN: [id number]  HPI: Thomas Mercado is a 59 y.o. male  Chief Complaint  Patient presents with  . surgical clearance   Patient doing well taking aspirin 81 mg  Pravachol 20 mg Nexium 40 Allegra and Flonase. Patient with no complaints except for knee which is going be operated on this week. For partial meniscectomy Patient's had cardiac clearance with EKG and stress test which was reported as normal.   Relevant past medical, surgical, family and social history reviewed and updated as indicated. Interim medical history since our last visit reviewed. Allergies and medications reviewed and updated.  Review of Systems  Constitutional: Negative.   HENT: Negative.   Eyes: Negative.   Respiratory: Negative.   Cardiovascular: Negative.   Gastrointestinal: Negative.   Endocrine: Negative.   Genitourinary: Negative.   Musculoskeletal: Negative.   Skin: Negative.   Allergic/Immunologic: Negative.   Neurological: Negative.   Hematological: Negative.   Psychiatric/Behavioral: Negative.     Per HPI unless specifically indicated above     Objective:    BP 139/86 mmHg  Pulse 79  Temp(Src) 97.7 F (36.5 C)  Ht 6' 1.8" (1.875 m)  Wt 261 lb (118.389 kg)  BMI 33.68 kg/m2  SpO2 97%  Wt Readings from Last 3 Encounters:  04/06/15 261 lb (118.389 kg)  03/31/15 266 lb (120.657 kg)  03/11/15 274 lb 6.4 oz (124.467 kg)    Physical Exam  Constitutional: He is oriented to person, place, and time. He appears well-developed and well-nourished. No distress.  HENT:  Head: Normocephalic and atraumatic.  Right Ear: Hearing and external ear normal.  Left Ear: Hearing and external ear normal.  Nose: Nose normal.  Mouth/Throat: Oropharynx is clear and moist.  Eyes: Conjunctivae and  lids are normal. Pupils are equal, round, and reactive to light. Right eye exhibits no discharge. Left eye exhibits no discharge. No scleral icterus.  Neck: Normal range of motion. Neck supple. No tracheal deviation present. No thyromegaly present.  Cardiovascular: Normal rate, regular rhythm and normal heart sounds.   Pulmonary/Chest: Effort normal and breath sounds normal. No respiratory distress. He has no rales. He exhibits no tenderness.  Abdominal: He exhibits no distension and no mass. There is no tenderness.  Musculoskeletal: Normal range of motion. He exhibits no edema or tenderness.  Lymphadenopathy:    He has no cervical adenopathy.  Neurological: He is alert and oriented to person, place, and time.  Skin: Skin is warm, dry and intact. No rash noted.  Psychiatric: He has a normal mood and affect. His speech is normal and behavior is normal. Judgment and thought content normal. Cognition and memory are normal.    Results for orders placed or performed during the hospital encounter of 03/31/15  CBC  Result Value Ref Range   WBC 5.6 3.8 - 10.6 K/uL   RBC 5.04 4.40 - 5.90 MIL/uL   Hemoglobin 15.1 13.0 - 18.0 g/dL   HCT 44.7 40.0 - 52.0 %   MCV 88.7 80.0 - 100.0 fL   MCH 30.0 26.0 - 34.0 pg   MCHC 33.8 32.0 - 36.0 g/dL   RDW 13.2 11.5 - 14.5 %   Platelets 196 150 - 440 K/uL  Protime-INR  Result Value Ref Range   Prothrombin Time 14.1 11.4 - 15.0 seconds   INR 1.07   APTT  Result Value Ref Range   aPTT 28 24 - 36 seconds  Urinalysis complete, with microscopic ([facility] only)  Result Value Ref Range   Color, Urine YELLOW (A) YELLOW   APPearance CLEAR (A) CLEAR   Glucose, UA NEGATIVE NEGATIVE mg/dL   Bilirubin Urine NEGATIVE NEGATIVE   Ketones, ur NEGATIVE NEGATIVE mg/dL   Specific Gravity, Urine 1.010 1.005 - 1.030   Hgb urine dipstick NEGATIVE NEGATIVE   pH 6.0 5.0 - 8.0   Protein, ur NEGATIVE NEGATIVE mg/dL   Nitrite NEGATIVE NEGATIVE   Leukocytes, UA NEGATIVE NEGATIVE    RBC / HPF 0-5 0 - 5 RBC/hpf   WBC, UA 0-5 0 - 5 WBC/hpf   Bacteria, UA NONE SEEN NONE SEEN   Squamous Epithelial / LPF 0-5 (A) NONE SEEN   Mucous PRESENT   Basic metabolic panel  Result Value Ref Range   Sodium 140 135 - 145 mmol/L   Potassium 4.3 3.5 - 5.1 mmol/L   Chloride 105 101 - 111 mmol/L   CO2 27 22 - 32 mmol/L   Glucose, Bld 89 65 - 99 mg/dL   BUN 18 6 - 20 mg/dL   Creatinine, Ser 1.00 0.61 - 1.24 mg/dL   Calcium 9.8 8.9 - 10.3 mg/dL   GFR calc non Af Amer >60 >60 mL/min   GFR calc Af Amer >60 >60 mL/min   Anion gap 8 5 - 15      Assessment & Plan:   Problem List Items Addressed This Visit      Other   Knee pain, left - Primary    Patient cleared for surgery for left meniscal removal. Patient will stop aspirin today. He will restart several days after surgery.           Follow up plan: Return if symptoms worsen or fail to improve, for Regularly scheduled visit.

## 2015-04-06 NOTE — Assessment & Plan Note (Signed)
Patient cleared for surgery for left meniscal removal. Patient will stop aspirin today. He will restart several days after surgery.

## 2015-04-09 ENCOUNTER — Ambulatory Visit: Payer: Managed Care, Other (non HMO) | Admitting: Certified Registered Nurse Anesthetist

## 2015-04-09 ENCOUNTER — Encounter: Payer: Self-pay | Admitting: *Deleted

## 2015-04-09 ENCOUNTER — Encounter
Admission: RE | Disposition: A | Discharge status: Home / Self Care | Payer: Self-pay | Source: Ambulatory Visit | Attending: Orthopedic Surgery

## 2015-04-09 ENCOUNTER — Ambulatory Visit
Admission: RE | Admit: 2015-04-09 | Discharge: 2015-04-09 | Disposition: A | Discharge status: Home / Self Care | Payer: Managed Care, Other (non HMO) | Source: Ambulatory Visit | Attending: Orthopedic Surgery | Admitting: Orthopedic Surgery

## 2015-04-09 DIAGNOSIS — M2342 Loose body in knee, left knee: Secondary | ICD-10-CM | POA: Diagnosis not present

## 2015-04-09 DIAGNOSIS — Z880 Allergy status to penicillin: Secondary | ICD-10-CM | POA: Diagnosis not present

## 2015-04-09 DIAGNOSIS — R011 Cardiac murmur, unspecified: Secondary | ICD-10-CM | POA: Diagnosis not present

## 2015-04-09 DIAGNOSIS — I4891 Unspecified atrial fibrillation: Secondary | ICD-10-CM | POA: Insufficient documentation

## 2015-04-09 DIAGNOSIS — X58XXXA Exposure to other specified factors, initial encounter: Secondary | ICD-10-CM | POA: Insufficient documentation

## 2015-04-09 DIAGNOSIS — E7800 Pure hypercholesterolemia, unspecified: Secondary | ICD-10-CM | POA: Insufficient documentation

## 2015-04-09 DIAGNOSIS — Z8261 Family history of arthritis: Secondary | ICD-10-CM | POA: Insufficient documentation

## 2015-04-09 DIAGNOSIS — Z7982 Long term (current) use of aspirin: Secondary | ICD-10-CM | POA: Insufficient documentation

## 2015-04-09 DIAGNOSIS — G4730 Sleep apnea, unspecified: Secondary | ICD-10-CM | POA: Diagnosis not present

## 2015-04-09 DIAGNOSIS — S83242A Other tear of medial meniscus, current injury, left knee, initial encounter: Secondary | ICD-10-CM | POA: Insufficient documentation

## 2015-04-09 DIAGNOSIS — Y929 Unspecified place or not applicable: Secondary | ICD-10-CM | POA: Diagnosis not present

## 2015-04-09 DIAGNOSIS — Y939 Activity, unspecified: Secondary | ICD-10-CM | POA: Diagnosis not present

## 2015-04-09 DIAGNOSIS — M659 Synovitis and tenosynovitis, unspecified: Secondary | ICD-10-CM | POA: Insufficient documentation

## 2015-04-09 DIAGNOSIS — Z8249 Family history of ischemic heart disease and other diseases of the circulatory system: Secondary | ICD-10-CM | POA: Insufficient documentation

## 2015-04-09 DIAGNOSIS — Z79899 Other long term (current) drug therapy: Secondary | ICD-10-CM | POA: Insufficient documentation

## 2015-04-09 DIAGNOSIS — K219 Gastro-esophageal reflux disease without esophagitis: Secondary | ICD-10-CM | POA: Insufficient documentation

## 2015-04-09 HISTORY — PX: KNEE ARTHROSCOPY: SHX127

## 2015-04-09 SURGERY — ARTHROSCOPY, KNEE
Anesthesia: General | Site: Knee | Laterality: Left | Wound class: Clean

## 2015-04-09 MED ORDER — LIDOCAINE HCL (CARDIAC) 20 MG/ML IV SOLN
INTRAVENOUS | Status: DC | PRN
  Administered 2015-04-09: 20 mg via INTRAVENOUS

## 2015-04-09 MED ORDER — CLINDAMYCIN PHOSPHATE 600 MG/50ML IV SOLN
INTRAVENOUS | Status: AC
  Administered 2015-04-09: 600 mg via INTRAVENOUS
  Administered 2015-04-09: 300 mg via INTRAVENOUS
  Filled 2015-04-09: qty 50

## 2015-04-09 MED ORDER — ONDANSETRON HCL 4 MG/2ML IJ SOLN
INTRAMUSCULAR | Status: DC | PRN
  Administered 2015-04-09: 4 mg via INTRAVENOUS

## 2015-04-09 MED ORDER — ASPIRIN EC 325 MG PO TBEC: 325.0000 mg | DELAYED_RELEASE_TABLET | Freq: Every day | ORAL | Status: DC

## 2015-04-09 MED ORDER — CLINDAMYCIN PHOSPHATE 600 MG/50ML IV SOLN
INTRAVENOUS | Status: AC
  Filled 2015-04-09: qty 50

## 2015-04-09 MED ORDER — LIDOCAINE HCL (PF) 1 % IJ SOLN
INTRAMUSCULAR | Status: AC
  Filled 2015-04-09: qty 30

## 2015-04-09 MED ORDER — FENTANYL CITRATE (PF) 100 MCG/2ML IJ SOLN
INTRAMUSCULAR | Status: DC | PRN
  Administered 2015-04-09 (×6): 25 ug via INTRAVENOUS
  Administered 2015-04-09: 50 ug via INTRAVENOUS

## 2015-04-09 MED ORDER — BUPIVACAINE-EPINEPHRINE 0.25% -1:200000 IJ SOLN
INTRAMUSCULAR | Status: DC | PRN
  Administered 2015-04-09: 30 mL

## 2015-04-09 MED ORDER — PROPOFOL 10 MG/ML IV BOLUS
INTRAVENOUS | Status: DC | PRN
  Administered 2015-04-09: 200 mg via INTRAVENOUS

## 2015-04-09 MED ORDER — LACTATED RINGERS IV SOLN
INTRAVENOUS | Status: DC | PRN
  Administered 2015-04-09 (×2): via INTRAVENOUS

## 2015-04-09 MED ORDER — HYDROCODONE-ACETAMINOPHEN 5-325 MG PO TABS: 1.0000 | ORAL_TABLET | Freq: Four times a day (QID) | ORAL | Status: DC | PRN

## 2015-04-09 MED ORDER — MIDAZOLAM HCL 2 MG/2ML IJ SOLN
INTRAMUSCULAR | Status: DC | PRN
  Administered 2015-04-09: 2 mg via INTRAVENOUS

## 2015-04-09 MED ORDER — PROMETHAZINE HCL 12.5 MG PO TABS: 12.5000 mg | ORAL_TABLET | ORAL | Status: DC | PRN

## 2015-04-09 MED ORDER — BUPIVACAINE-EPINEPHRINE (PF) 0.25% -1:200000 IJ SOLN
INTRAMUSCULAR | Status: AC
  Filled 2015-04-09: qty 30

## 2015-04-09 MED ORDER — FENTANYL CITRATE (PF) 100 MCG/2ML IJ SOLN
25.0000 ug | INTRAMUSCULAR | Status: DC | PRN
  Administered 2015-04-09: 25 ug via INTRAVENOUS
  Administered 2015-04-09: 50 ug via INTRAVENOUS
  Administered 2015-04-09: 25 ug via INTRAVENOUS

## 2015-04-09 MED ORDER — LIDOCAINE HCL 1 % IJ SOLN
INTRAMUSCULAR | Status: DC | PRN
  Administered 2015-04-09: 9 mL

## 2015-04-09 MED ORDER — PROMETHAZINE HCL 25 MG/ML IJ SOLN: 6.2500 mg | INTRAMUSCULAR | Status: DC | PRN

## 2015-04-09 MED ORDER — KETOROLAC TROMETHAMINE 30 MG/ML IJ SOLN
INTRAMUSCULAR | Status: DC | PRN
  Administered 2015-04-09: 30 mg via INTRAVENOUS

## 2015-04-09 MED ORDER — FENTANYL CITRATE (PF) 100 MCG/2ML IJ SOLN
INTRAMUSCULAR | Status: AC
  Administered 2015-04-09: 25 ug via INTRAVENOUS
  Filled 2015-04-09: qty 2

## 2015-04-09 SURGICAL SUPPLY — 33 items
5.5MM RESECTOR ×2 IMPLANT
BUR RADIUS 3.5 (BURR) IMPLANT
BUR RADIUS 4.0X18.5 (BURR) ×2 IMPLANT
COOLER POLAR GLACIER W/PUMP (MISCELLANEOUS) ×2 IMPLANT
DRAPE IMP U-DRAPE 54X76 (DRAPES) ×2 IMPLANT
DURAPREP 26ML APPLICATOR (WOUND CARE) ×4 IMPLANT
GAUZE PETRO XEROFOAM 1X8 (MISCELLANEOUS) ×2 IMPLANT
GAUZE SPONGE 4X4 12PLY STRL (GAUZE/BANDAGES/DRESSINGS) ×2 IMPLANT
GLOVE BIOGEL PI IND STRL 9 (GLOVE) ×1 IMPLANT
GLOVE BIOGEL PI INDICATOR 9 (GLOVE) ×1
GLOVE SURG 9.0 ORTHO LTXF (GLOVE) ×2 IMPLANT
GOWN STRL REUS W/ TWL LRG LVL3 (GOWN DISPOSABLE) ×1 IMPLANT
GOWN STRL REUS W/TWL 2XL LVL3 (GOWN DISPOSABLE) ×2 IMPLANT
GOWN STRL REUS W/TWL LRG LVL3 (GOWN DISPOSABLE) ×1
IV LACTATED RINGER IRRG 3000ML (IV SOLUTION) ×7
IV LR IRRIG 3000ML ARTHROMATIC (IV SOLUTION) ×7 IMPLANT
KIT RM TURNOVER STRD PROC AR (KITS) ×2 IMPLANT
MANIFOLD NEPTUNE II (INSTRUMENTS) ×2 IMPLANT
PACK ARTHROSCOPY KNEE (MISCELLANEOUS) ×2 IMPLANT
PAD ABD DERMACEA PRESS 5X9 (GAUZE/BANDAGES/DRESSINGS) ×4 IMPLANT
PAD WRAPON POLAR KNEE (MISCELLANEOUS) ×1 IMPLANT
SET TUBE SUCT SHAVER OUTFL 24K (TUBING) ×2 IMPLANT
SET TUBE TIP INTRA-ARTICULAR (MISCELLANEOUS) ×2 IMPLANT
SOL PREP PVP 2OZ (MISCELLANEOUS) ×2
SOLUTION PREP PVP 2OZ (MISCELLANEOUS) ×1 IMPLANT
STRIP CLOSURE SKIN 1/2X4 (GAUZE/BANDAGES/DRESSINGS) ×2 IMPLANT
SUT ETHILON 4-0 (SUTURE) ×1
SUT ETHILON 4-0 FS2 18XMFL BLK (SUTURE) ×1
SUTURE ETHLN 4-0 FS2 18XMF BLK (SUTURE) ×1 IMPLANT
TUBING ARTHRO INFLOW-ONLY STRL (TUBING) ×2 IMPLANT
WAND HAND CNTRL MULTIVAC 50 (MISCELLANEOUS) IMPLANT
WAND HAND CNTRL MULTIVAC 90 (MISCELLANEOUS) ×2 IMPLANT
WRAPON POLAR PAD KNEE (MISCELLANEOUS) ×2

## 2015-04-09 NOTE — Anesthesia Postprocedure Evaluation (Signed)
  Anesthesia Post-op Note  Patient: Thomas Mercado  Procedure(s) Performed: Procedure(s): ,left knee arthroscopy, limited synovectomy and partial medial menisectomy. (Left)  Anesthesia type:General  Patient location: PACU  Post pain: Pain level controlled  Post assessment: Post-op Vital signs reviewed, Patient's Cardiovascular Status Stable, Respiratory Function Stable, Patent Airway and No signs of Nausea or vomiting  Post vital signs: Reviewed and stable  Last Vitals:  Filed Vitals:   04/09/15 1334  BP: 142/96  Pulse: 74  Temp: 36.3 C  Resp: 14    Level of consciousness: awake, alert  and patient cooperative  Complications: No apparent anesthesia complications

## 2015-04-09 NOTE — Anesthesia Procedure Notes (Signed)
Procedure Name: LMA Insertion Date/Time: 04/09/2015 11:45 AM Performed by: Naomie Dean Pre-anesthesia Checklist: Emergency Drugs available, Suction available, Patient identified, Patient being monitored and Timeout performed Patient Re-evaluated:Patient Re-evaluated prior to inductionOxygen Delivery Method: Circle system utilized Preoxygenation: Pre-oxygenation with 100% oxygen Intubation Type: IV induction LMA: LMA inserted LMA Size: 4.5

## 2015-04-09 NOTE — Transfer of Care (Signed)
Immediate Anesthesia Transfer of Care Note  Patient: Thomas Mercado  Procedure(s) Performed: Procedure(s): ,left knee arthroscopy, limited synovectomy and partial medial menisectomy. (Left)  Patient Location: PACU  Anesthesia Type:General  Level of Consciousness: awake  Airway & Oxygen Therapy: Patient connected to face mask oxygen  Post-op Assessment: Report given to RN  Post vital signs: stable  Last Vitals:  Filed Vitals:   04/09/15 1334  BP: 142/96  Pulse: 74  Temp: 36.3 C  Resp: 14    Complications: No apparent anesthesia complications

## 2015-04-09 NOTE — Anesthesia Preprocedure Evaluation (Signed)
Anesthesia Evaluation  Patient identified by MRN, date of birth, ID band Patient awake    Reviewed: Allergy & Precautions, H&P , NPO status , Patient's Chart, lab work & pertinent test results, reviewed documented beta blocker date and time   History of Anesthesia Complications Negative for: history of anesthetic complications  Airway Mallampati: II  TM Distance: >3 FB Neck ROM: full    Dental no notable dental hx. (+) Teeth Intact   Pulmonary neg shortness of breath, sleep apnea and Continuous Positive Airway Pressure Ventilation , neg COPD, neg recent URI,    Pulmonary exam normal breath sounds clear to auscultation       Cardiovascular Exercise Tolerance: Good hypertension (not currently on meds, borderline), (-) angina(-) CAD, (-) Past MI, (-) Cardiac Stents and (-) CABG negative cardio ROS Normal cardiovascular exam+ dysrhythmias (h/o a fib s/p ablation) Atrial Fibrillation + Valvular Problems/Murmurs  Rhythm:regular Rate:Normal     Neuro/Psych negative neurological ROS  negative psych ROS   GI/Hepatic Neg liver ROS, GERD  Medicated and Controlled,  Endo/Other  negative endocrine ROS  Renal/GU negative Renal ROS  negative genitourinary   Musculoskeletal   Abdominal   Peds  Hematology negative hematology ROS (+)   Anesthesia Other Findings Past Medical History:   Allergy                                                      Hypertension                                                 Heart murmur                                                 ED (erectile dysfunction)                                    Reproductive/Obstetrics negative OB ROS                             Anesthesia Physical Anesthesia Plan  ASA: II  Anesthesia Plan: General   Post-op Pain Management:    Induction:   Airway Management Planned:   Additional Equipment:   Intra-op Plan:   Post-operative Plan:    Informed Consent: I have reviewed the patients History and Physical, chart, labs and discussed the procedure including the risks, benefits and alternatives for the proposed anesthesia with the patient or authorized representative who has indicated his/her understanding and acceptance.   Dental Advisory Given  Plan Discussed with: Anesthesiologist, CRNA and Surgeon  Anesthesia Plan Comments:         Anesthesia Quick Evaluation

## 2015-04-09 NOTE — H&P (Signed)
The patient has been re-examined, and the chart reviewed, and there have been no interval changes to the documented history and physical.    The risks, benefits, and alternatives have been discussed at length, and the patient is willing to proceed.   

## 2015-04-09 NOTE — Op Note (Addendum)
PATIENT:  Thomas Mercado  PRE-OPERATIVE DIAGNOSIS:  LEFT KNEE MEDIAL MENISCUS TEAR  POST-OPERATIVE DIAGNOSIS:  Same  PROCEDURE:  LEFT KNEE ARTHROSCOPY WITH  Partial MEDIAL MENISECTOMY, chondroplasty of the trochlea and limited synovectomy.  SURGEON:  Thornton Park, MD  ANESTHESIA:   General  PREOPERATIVE INDICATIONS:  Thomas Mercado  59 y.o. male with a diagnosis of TEAR OF MEDIAL MENISCUS of the left knee who failed conservative management and elected for surgery with the plan for partial medial meniscectomy.    The risks benefits and alternatives were discussed with the patient preoperatively including the risks of infection, bleeding, nerve injury, knee stiffness, persistent pain, osteoarthritis and the need for further surgery. Medical  risks include DVT and pulmonary embolism, myocardial infarction, stroke, pneumonia, respiratory failure and death. The patient understood these risks and wished to proceed.   OPERATIVE FINDINGS: Tear involving the body of the medial meniscus, left knee. Advanced degenerative changes involving the femoral trochlea extending into the nonweightbearing portion of the medial femoral condyle.  OPERATIVE PROCEDURE: Patient was met in the preoperative area with his family at the bedside. The operative extremity was signed with the word yes and my initials according the hospital's correct site of surgery protocol.  The patient was brought to the operating room where he was placed supine on the operative table. General anesthesia was administered. The patient was prepped and draped in a sterile fashion.  A timeout was performed to verify the patient's name, date of birth, medical record number, correct site of surgery correct procedure to be performed. It was also used to verify the patient had had received antibiotics that all appropriate instruments, and radiographic studies were available in the room. Once all in attendance were in agreement, the case  began.  Proposed arthroscopy incisions were drawn out with a surgical marker. These were pre-injected with 1% lidocaine plain. An 11 blade was used to establish an inferior lateral and inferomedial portals. The inferomedial portal was created using a 18-gauge spinal needle under direct visualization.  A full diagnostic examination of the knee was performed including the suprapatellar pouch, patellofemoral joint, medial lateral compartments as well as the medial lateral gutters, the intercondylar notch in the posterior knee.  Findings on arthroscopy included tear of the body of the medial meniscus. Diffuse loss of articular cartilage within the femoral trochlea extending into the nonweightbearing surface of the medial femoral condyle and diffuse synovitis..  Patient had the meniscal tear treated with a 4-0 resector shaver blade and straight duckbill basket. The meniscus was debrided until a stable rim was achieved. A chondroplasty of the medial femoral condyle, trochlea and undersurface of patella was also performed using a 4-0 resector shaver blade. A partial synovectomy was also performed using a 4-0 resector shaver blade and 90 ArthroCare wand.   The patient's MRI had demonstrated a loose body in the posterior knee near the PCL. I looked for the loose body during the patient's arthroscopy today. The medial tibial spine was gently burred to allow placement of the arthroscope and probe into the posterior knee.  The loose body cannot be done under direct visualization or with palpation with the probe. It was likely encapsulated by soft tissue and was not free-floating in the joint. The patient also did not have pain in the posterior knee clinically. Therefore the decision was made to leave the loose bony and avoid extensive dissection which may have caused a neurovascular injury. The knee joint was then copiously lavaged.  All arthroscopic incisions removed. The  two arthroscopy portals were closed with  4-0 nylon. Steri-Strips were applied along with a dry sterile and compressive dressing. The patient was brought to the PACU in stable condition. I scrubbed and present the entire case and all sharp and instrument counts were correct at the conclusion the case. I spoke to the patient's family postoperatively to let them know the case it done without complication and the patient was stable in the recovery room.  Timoteo Gaul, MD

## 2015-07-23 ENCOUNTER — Ambulatory Visit (INDEPENDENT_AMBULATORY_CARE_PROVIDER_SITE_OTHER): Payer: Managed Care, Other (non HMO) | Admitting: Family Medicine

## 2015-07-23 ENCOUNTER — Encounter: Payer: Self-pay | Admitting: Family Medicine

## 2015-07-23 VITALS — BP 138/84 | HR 79 | Temp 98.0°F | Ht 73.8 in | Wt 259.0 lb

## 2015-07-23 DIAGNOSIS — E785 Hyperlipidemia, unspecified: Secondary | ICD-10-CM | POA: Diagnosis not present

## 2015-07-23 DIAGNOSIS — Z1211 Encounter for screening for malignant neoplasm of colon: Secondary | ICD-10-CM

## 2015-07-23 DIAGNOSIS — G4733 Obstructive sleep apnea (adult) (pediatric): Secondary | ICD-10-CM

## 2015-07-23 DIAGNOSIS — Z0000 Encounter for general adult medical examination without abnormal findings: Secondary | ICD-10-CM

## 2015-07-23 DIAGNOSIS — K760 Fatty (change of) liver, not elsewhere classified: Secondary | ICD-10-CM | POA: Diagnosis not present

## 2015-07-23 DIAGNOSIS — R030 Elevated blood-pressure reading, without diagnosis of hypertension: Secondary | ICD-10-CM

## 2015-07-23 DIAGNOSIS — K219 Gastro-esophageal reflux disease without esophagitis: Secondary | ICD-10-CM

## 2015-07-23 DIAGNOSIS — Z113 Encounter for screening for infections with a predominantly sexual mode of transmission: Secondary | ICD-10-CM

## 2015-07-23 DIAGNOSIS — J309 Allergic rhinitis, unspecified: Secondary | ICD-10-CM

## 2015-07-23 DIAGNOSIS — E291 Testicular hypofunction: Secondary | ICD-10-CM

## 2015-07-23 LAB — MICROSCOPIC EXAMINATION

## 2015-07-23 LAB — URINALYSIS, ROUTINE W REFLEX MICROSCOPIC
Bilirubin, UA: NEGATIVE
Glucose, UA: NEGATIVE
Ketones, UA: NEGATIVE
Nitrite, UA: NEGATIVE
Protein, UA: NEGATIVE
RBC, UA: NEGATIVE
Specific Gravity, UA: 1.01 (ref 1.005–1.030)
Urobilinogen, Ur: 0.2 mg/dL (ref 0.2–1.0)
pH, UA: 5.5 (ref 5.0–7.5)

## 2015-07-23 MED ORDER — FLUTICASONE PROPIONATE 50 MCG/ACT NA SUSP: 2.0000 | Freq: Every day | NASAL | Status: DC

## 2015-07-23 MED ORDER — FEXOFENADINE HCL 180 MG PO TABS: 180.0000 mg | ORAL_TABLET | Freq: Every day | ORAL | Status: DC

## 2015-07-23 MED ORDER — PRAVASTATIN SODIUM 20 MG PO TABS: 20.0000 mg | ORAL_TABLET | Freq: Every day | ORAL | Status: DC

## 2015-07-23 MED ORDER — ESOMEPRAZOLE MAGNESIUM 40 MG PO CPDR: 40.0000 mg | DELAYED_RELEASE_CAPSULE | Freq: Every day | ORAL | Status: DC

## 2015-07-23 NOTE — Assessment & Plan Note (Signed)
Discuss diet and exercise wt loss and follow blood pressure at work if continued borderline will consider adding medications.

## 2015-07-23 NOTE — Assessment & Plan Note (Signed)
The current medical regimen is effective;  continue present plan and medications.  

## 2015-07-23 NOTE — Assessment & Plan Note (Signed)
Reviewed notes from gastroenterology for elevated liver enzymes will continue to observe and continue Pravachol as per their recommendations

## 2015-07-23 NOTE — Assessment & Plan Note (Signed)
Discussed patient with low testosterone last in 2012 never did full replacement therapy as developed atrial fib shortly after now interested in rechecking for problems with ED. Will start with testosterone then proceed accordingly

## 2015-07-23 NOTE — Progress Notes (Addendum)
BP 138/84 mmHg  Pulse 79  Temp(Src) 98 F (36.7 C)  Ht 6' 1.8" (1.875 m)  Wt 259 lb (117.482 kg)  BMI 33.42 kg/m2  SpO2 97%   Subjective:    Patient ID: Thomas Mercado, male    DOB: July 28, 1955, 60 y.o.   MRN: [id number]  HPI: Thomas Mercado is a 60 y.o. male  Chief Complaint  Patient presents with  . Annual Exam   issue is some left trapezius discomfort over the last couple weeks no known specific trauma has done some extra lifting at home but bothers him more at work when he is at his desk. No numbness tingling or other symptoms associated. pt taking allergy medicines Allegra and Flonase with good control Nexium for reflux which is stable Pravachol for cholesterol no side effects from medications and takes medicines faithfully   Relevant past medical, surgical, family and social history reviewed and updated as indicated. Interim medical history since our last visit reviewed. Allergies and medications reviewed and updated.  Review of Systems  Constitutional: Negative.   HENT: Negative.   Eyes: Negative.   Respiratory: Negative.   Cardiovascular: Negative.   Gastrointestinal: Negative.   Endocrine: Negative.   Genitourinary: Negative.   Musculoskeletal: Negative.   Skin: Negative.   Allergic/Immunologic: Negative.   Neurological: Negative.   Hematological: Negative.   Psychiatric/Behavioral: Negative.     Per HPI unless specifically indicated above     Objective:    BP 138/84 mmHg  Pulse 79  Temp(Src) 98 F (36.7 C)  Ht 6' 1.8" (1.875 m)  Wt 259 lb (117.482 kg)  BMI 33.42 kg/m2  SpO2 97%  Wt Readings from Last 3 Encounters:  07/23/15 259 lb (117.482 kg)  04/09/15 261 lb (118.389 kg)  04/06/15 261 lb (118.389 kg)    Physical Exam  Constitutional: He is oriented to person, place, and time. He appears well-developed and well-nourished.  HENT:  Head: Normocephalic and atraumatic.  Right Ear: External ear normal.  Left Ear: External ear normal.  Eyes:  Conjunctivae and EOM are normal. Pupils are equal, round, and reactive to light.  Neck: Normal range of motion. Neck supple.  Cardiovascular: Normal rate, regular rhythm, normal heart sounds and intact distal pulses.   Pulmonary/Chest: Effort normal and breath sounds normal.  Abdominal: Soft. Bowel sounds are normal. There is no splenomegaly or hepatomegaly.  Genitourinary: Rectum normal, prostate normal and penis normal.  Musculoskeletal: Normal range of motion.  Neurological: He is alert and oriented to person, place, and time. He has normal reflexes.  Skin: No rash noted. No erythema.  Psychiatric: He has a normal mood and affect. His behavior is normal. Judgment and thought content normal.    Results for orders placed or performed during the hospital encounter of 03/31/15  CBC  Result Value Ref Range   WBC 5.6 3.8 - 10.6 K/uL   RBC 5.04 4.40 - 5.90 MIL/uL   Hemoglobin 15.1 13.0 - 18.0 g/dL   HCT 44.7 40.0 - 52.0 %   MCV 88.7 80.0 - 100.0 fL   MCH 30.0 26.0 - 34.0 pg   MCHC 33.8 32.0 - 36.0 g/dL   RDW 13.2 11.5 - 14.5 %   Platelets 196 150 - 440 K/uL  Protime-INR  Result Value Ref Range   Prothrombin Time 14.1 11.4 - 15.0 seconds   INR 1.07   APTT  Result Value Ref Range   aPTT 28 24 - 36 seconds  Urinalysis complete, with microscopic ([facility] only)  Result  Value Ref Range   Color, Urine YELLOW (A) YELLOW   APPearance CLEAR (A) CLEAR   Glucose, UA NEGATIVE NEGATIVE mg/dL   Bilirubin Urine NEGATIVE NEGATIVE   Ketones, ur NEGATIVE NEGATIVE mg/dL   Specific Gravity, Urine 1.010 1.005 - 1.030   Hgb urine dipstick NEGATIVE NEGATIVE   pH 6.0 5.0 - 8.0   Protein, ur NEGATIVE NEGATIVE mg/dL   Nitrite NEGATIVE NEGATIVE   Leukocytes, UA NEGATIVE NEGATIVE   RBC / HPF 0-5 0 - 5 RBC/hpf   WBC, UA 0-5 0 - 5 WBC/hpf   Bacteria, UA NONE SEEN NONE SEEN   Squamous Epithelial / LPF 0-5 (A) NONE SEEN   Mucous PRESENT   Basic metabolic panel  Result Value Ref Range   Sodium 140 135 -  145 mmol/L   Potassium 4.3 3.5 - 5.1 mmol/L   Chloride 105 101 - 111 mmol/L   CO2 27 22 - 32 mmol/L   Glucose, Bld 89 65 - 99 mg/dL   BUN 18 6 - 20 mg/dL   Creatinine, Ser 1.00 0.61 - 1.24 mg/dL   Calcium 9.8 8.9 - 10.3 mg/dL   GFR calc non Af Amer >60 >60 mL/min   GFR calc Af Amer >60 >60 mL/min   Anion gap 8 5 - 15      Assessment & Plan:   Problem List Items Addressed This Visit      Respiratory   Obstructive apnea   Allergic rhinitis    The current medical regimen is effective;  continue present plan and medications.       Relevant Medications   fluticasone (FLONASE) 50 MCG/ACT nasal spray     Digestive   Non-alcoholic fatty liver disease    Reviewed notes from gastroenterology for elevated liver enzymes will continue to observe and continue Pravachol as per their recommendations      Acid reflux    The current medical regimen is effective;  continue present plan and medications.       Relevant Medications   esomeprazole (NEXIUM) 40 MG capsule     Endocrine   Hypogonadism in male    Discussed patient with low testosterone last in 2012 never did full replacement therapy as developed atrial fib shortly after now interested in rechecking for problems with ED. Will start with testosterone then proceed accordingly      Relevant Orders   Testosterone     Other   HLD (hyperlipidemia)    The current medical regimen is effective;  continue present plan and medications.       Relevant Medications   aspirin EC 81 MG tablet   pravastatin (PRAVACHOL) 20 MG tablet   Borderline blood pressure    Discuss diet and exercise wt loss and follow blood pressure at work if continued borderline will consider adding medications.        Other Visit Diagnoses    Colon cancer screening    -  Primary    Relevant Orders    Ambulatory referral to Gastroenterology    Routine screening for STI (sexually transmitted infection)        Relevant Orders    HIV antibody     Hepatitis C Antibody    Routine general medical examination at a health care facility        Relevant Orders    CBC with Differential/Platelet    Comprehensive metabolic panel    Lipid Panel w/o Chol/HDL Ratio    PSA    TSH  Urinalysis, Routine w reflex microscopic (not at [facility])        Follow up plan: Return in about 6 months (around 01/20/2016), or if symptoms worsen or fail to improve, for med check, lipids, alt, ast.

## 2015-07-23 NOTE — Addendum Note (Signed)
Addended byGolden Pop on: 07/23/2015 08:39 AM   Modules accepted: Miquel Dunn

## 2015-07-24 LAB — COMPREHENSIVE METABOLIC PANEL
ALT: 46 IU/L — ABNORMAL HIGH (ref 0–44)
AST: 28 IU/L (ref 0–40)
Albumin/Globulin Ratio: 2 (ref 1.1–2.5)
Albumin: 4.5 g/dL (ref 3.6–4.8)
Alkaline Phosphatase: 91 IU/L (ref 39–117)
BUN/Creatinine Ratio: 26 — ABNORMAL HIGH (ref 10–22)
BUN: 24 mg/dL (ref 8–27)
Bilirubin Total: 0.7 mg/dL (ref 0.0–1.2)
CO2: 23 mmol/L (ref 18–29)
Calcium: 9.9 mg/dL (ref 8.6–10.2)
Chloride: 102 mmol/L (ref 96–106)
Creatinine, Ser: 0.92 mg/dL (ref 0.76–1.27)
GFR calc Af Amer: 104 mL/min/{1.73_m2} (ref >59)
GFR calc non Af Amer: 90 mL/min/{1.73_m2} (ref >59)
Globulin, Total: 2.3 g/dL (ref 1.5–4.5)
Glucose: 102 mg/dL — ABNORMAL HIGH (ref 65–99)
Potassium: 4.8 mmol/L (ref 3.5–5.2)
Sodium: 141 mmol/L (ref 134–144)
Total Protein: 6.8 g/dL (ref 6.0–8.5)

## 2015-07-24 LAB — CBC WITH DIFFERENTIAL/PLATELET
Basophils Absolute: 0.1 10*3/uL (ref 0.0–0.2)
Basos: 1 %
EOS (ABSOLUTE): 0.2 10*3/uL (ref 0.0–0.4)
Eos: 3 %
Hematocrit: 44.6 % (ref 37.5–51.0)
Hemoglobin: 14.9 g/dL (ref 12.6–17.7)
Immature Grans (Abs): 0 10*3/uL (ref 0.0–0.1)
Immature Granulocytes: 0 %
Lymphocytes Absolute: 2.4 10*3/uL (ref 0.7–3.1)
Lymphs: 37 %
MCH: 29.6 pg (ref 26.6–33.0)
MCHC: 33.4 g/dL (ref 31.5–35.7)
MCV: 89 fL (ref 79–97)
Monocytes Absolute: 0.6 10*3/uL (ref 0.1–0.9)
Monocytes: 10 %
Neutrophils Absolute: 3.1 10*3/uL (ref 1.4–7.0)
Neutrophils: 49 %
Platelets: 189 10*3/uL (ref 150–379)
RBC: 5.03 x10E6/uL (ref 4.14–5.80)
RDW: 13.9 % (ref 12.3–15.4)
WBC: 6.3 10*3/uL (ref 3.4–10.8)

## 2015-07-24 LAB — LIPID PANEL W/O CHOL/HDL RATIO
Cholesterol, Total: 150 mg/dL (ref 100–199)
HDL: 44 mg/dL (ref >39)
LDL Calculated: 91 mg/dL (ref 0–99)
Triglycerides: 76 mg/dL (ref 0–149)
VLDL Cholesterol Cal: 15 mg/dL (ref 5–40)

## 2015-07-24 LAB — TESTOSTERONE: Testosterone: 465 ng/dL (ref 348–1197)

## 2015-07-24 LAB — PSA: Prostate Specific Ag, Serum: 1.4 ng/mL (ref 0.0–4.0)

## 2015-07-24 LAB — HEPATITIS C ANTIBODY: Hep C Virus Ab: <0.1 s/co ratio (ref 0.0–0.9)

## 2015-07-24 LAB — HIV ANTIBODY (ROUTINE TESTING W REFLEX): HIV Screen 4th Generation wRfx: NONREACTIVE

## 2015-07-24 LAB — TSH: TSH: 2.04 u[IU]/mL (ref 0.450–4.500)

## 2015-07-25 ENCOUNTER — Encounter: Payer: Self-pay | Admitting: Family Medicine

## 2015-09-02 ENCOUNTER — Other Ambulatory Visit: Payer: Self-pay | Admitting: Gastroenterology

## 2015-09-02 DIAGNOSIS — K760 Fatty (change of) liver, not elsewhere classified: Secondary | ICD-10-CM

## 2015-09-07 ENCOUNTER — Ambulatory Visit
Admission: RE | Admit: 2015-09-07 | Discharge: 2015-09-07 | Disposition: A | Discharge status: Home / Self Care | Payer: Managed Care, Other (non HMO) | Source: Ambulatory Visit | Attending: Gastroenterology | Admitting: Gastroenterology

## 2015-09-07 DIAGNOSIS — K760 Fatty (change of) liver, not elsewhere classified: Secondary | ICD-10-CM | POA: Insufficient documentation

## 2015-10-22 ENCOUNTER — Encounter: Payer: Self-pay | Admitting: *Deleted

## 2015-10-23 ENCOUNTER — Ambulatory Visit: Payer: Managed Care, Other (non HMO) | Admitting: Anesthesiology

## 2015-10-23 ENCOUNTER — Ambulatory Visit
Admission: RE | Admit: 2015-10-23 | Discharge: 2015-10-23 | Disposition: A | Discharge status: Home / Self Care | Payer: Managed Care, Other (non HMO) | Source: Ambulatory Visit | Attending: Gastroenterology | Admitting: Gastroenterology

## 2015-10-23 ENCOUNTER — Encounter: Payer: Self-pay | Admitting: *Deleted

## 2015-10-23 ENCOUNTER — Encounter
Admission: RE | Disposition: A | Discharge status: Home / Self Care | Payer: Self-pay | Source: Ambulatory Visit | Attending: Gastroenterology

## 2015-10-23 DIAGNOSIS — Z79899 Other long term (current) drug therapy: Secondary | ICD-10-CM | POA: Insufficient documentation

## 2015-10-23 DIAGNOSIS — Z1211 Encounter for screening for malignant neoplasm of colon: Secondary | ICD-10-CM | POA: Insufficient documentation

## 2015-10-23 DIAGNOSIS — Z7982 Long term (current) use of aspirin: Secondary | ICD-10-CM | POA: Insufficient documentation

## 2015-10-23 DIAGNOSIS — E785 Hyperlipidemia, unspecified: Secondary | ICD-10-CM | POA: Insufficient documentation

## 2015-10-23 DIAGNOSIS — K219 Gastro-esophageal reflux disease without esophagitis: Secondary | ICD-10-CM | POA: Insufficient documentation

## 2015-10-23 DIAGNOSIS — D124 Benign neoplasm of descending colon: Secondary | ICD-10-CM | POA: Diagnosis not present

## 2015-10-23 DIAGNOSIS — Z7951 Long term (current) use of inhaled steroids: Secondary | ICD-10-CM | POA: Diagnosis not present

## 2015-10-23 DIAGNOSIS — G4730 Sleep apnea, unspecified: Secondary | ICD-10-CM | POA: Insufficient documentation

## 2015-10-23 DIAGNOSIS — I10 Essential (primary) hypertension: Secondary | ICD-10-CM | POA: Diagnosis not present

## 2015-10-23 DIAGNOSIS — K621 Rectal polyp: Secondary | ICD-10-CM | POA: Diagnosis not present

## 2015-10-23 HISTORY — PX: COLONOSCOPY WITH PROPOFOL: SHX5780

## 2015-10-23 HISTORY — DX: Cardiac arrhythmia, unspecified: I49.9

## 2015-10-23 LAB — CBC
HCT: 43.4 % (ref 40.0–52.0)
Hemoglobin: 14.7 g/dL (ref 13.0–18.0)
MCH: 29.8 pg (ref 26.0–34.0)
MCHC: 33.8 g/dL (ref 32.0–36.0)
MCV: 88.1 fL (ref 80.0–100.0)
Platelets: 162 10*3/uL (ref 150–440)
RBC: 4.92 MIL/uL (ref 4.40–5.90)
RDW: 13.6 % (ref 11.5–14.5)
WBC: 4.9 10*3/uL (ref 3.8–10.6)

## 2015-10-23 LAB — PROTIME-INR
INR: 1.13
Prothrombin Time: 14.7 seconds (ref 11.4–15.0)

## 2015-10-23 SURGERY — COLONOSCOPY WITH PROPOFOL
Anesthesia: General

## 2015-10-23 MED ORDER — SODIUM CHLORIDE 0.9 % IV SOLN
INTRAVENOUS | Status: DC
  Administered 2015-10-23: 1000 mL via INTRAVENOUS

## 2015-10-23 MED ORDER — LIDOCAINE HCL (PF) 1 % IJ SOLN
INTRAMUSCULAR | Status: AC
  Administered 2015-10-23: 0.03 mL via INTRADERMAL
  Filled 2015-10-23: qty 2

## 2015-10-23 MED ORDER — PROPOFOL 10 MG/ML IV BOLUS
INTRAVENOUS | Status: DC | PRN
  Administered 2015-10-23: 20 mg via INTRAVENOUS
  Administered 2015-10-23: 30 mg via INTRAVENOUS

## 2015-10-23 MED ORDER — MIDAZOLAM HCL 2 MG/2ML IJ SOLN
INTRAMUSCULAR | Status: DC | PRN
  Administered 2015-10-23: 1 mg via INTRAVENOUS

## 2015-10-23 MED ORDER — LIDOCAINE HCL (PF) 1 % IJ SOLN
2.0000 mL | Freq: Once | INTRAMUSCULAR | Status: AC
  Administered 2015-10-23: 0.03 mL via INTRADERMAL

## 2015-10-23 MED ORDER — PROPOFOL 500 MG/50ML IV EMUL
INTRAVENOUS | Status: DC | PRN
  Administered 2015-10-23: 140 ug/kg/min via INTRAVENOUS

## 2015-10-23 MED ORDER — SODIUM CHLORIDE 0.9 % IV SOLN: INTRAVENOUS | Status: DC

## 2015-10-23 NOTE — Anesthesia Preprocedure Evaluation (Signed)
Anesthesia Evaluation  Patient identified by MRN, date of birth, ID band Patient awake    Reviewed: Allergy & Precautions, H&P , NPO status , Patient's Chart, lab work & pertinent test results  History of Anesthesia Complications Negative for: history of anesthetic complications  Airway Mallampati: III  TM Distance: <3 FB Neck ROM: limited    Dental  (+) Poor Dentition, Chipped   Pulmonary neg shortness of breath, sleep apnea ,    Pulmonary exam normal breath sounds clear to auscultation       Cardiovascular Exercise Tolerance: Good hypertension, (-) angina(-) Past MI and (-) DOE Normal cardiovascular exam+ dysrhythmias + Valvular Problems/Murmurs  Rhythm:regular Rate:Normal     Neuro/Psych negative neurological ROS  negative psych ROS   GI/Hepatic Neg liver ROS, GERD  Controlled,  Endo/Other  negative endocrine ROS  Renal/GU negative Renal ROS  negative genitourinary   Musculoskeletal   Abdominal   Peds  Hematology negative hematology ROS (+)   Anesthesia Other Findings Past Medical History:   Allergy                                                      Heart murmur                                                 ED (erectile dysfunction)                                    GERD (gastroesophageal reflux disease)                       Dysrhythmia                                                  Hypertension                                                 Sleep apnea                                                  Hyperlipidemia                                              Past Surgical History:   ABLATION                                         Sept 2014      Comment:heart   KNEE ARTHROSCOPY  Left 04/09/2015     Comment:Procedure: ,left knee arthroscopy, limited               synovectomy and partial medial menisectomy.;                Surgeon: Thornton Park, MD;  Location:  [facility]               ORS;  Service: Orthopedics;  Laterality: Left;   VARICOCELE EXCISION                                          BMI    Body Mass Index   32.14 kg/m 2      Reproductive/Obstetrics negative OB ROS                             Anesthesia Physical Anesthesia Plan  ASA: III  Anesthesia Plan: General   Post-op Pain Management:    Induction:   Airway Management Planned:   Additional Equipment:   Intra-op Plan:   Post-operative Plan:   Informed Consent: I have reviewed the patients History and Physical, chart, labs and discussed the procedure including the risks, benefits and alternatives for the proposed anesthesia with the patient or authorized representative who has indicated his/her understanding and acceptance.   Dental Advisory Given  Plan Discussed with: Anesthesiologist, CRNA and Surgeon  Anesthesia Plan Comments:         Anesthesia Quick Evaluation

## 2015-10-23 NOTE — Anesthesia Postprocedure Evaluation (Signed)
Anesthesia Post Note  Patient: Thomas Mercado  Procedure(s) Performed: Procedure(s) (LRB): COLONOSCOPY WITH PROPOFOL (N/A)  Patient location during evaluation: Endoscopy Anesthesia Type: General Level of consciousness: awake and alert Pain management: pain level controlled Vital Signs Assessment: post-procedure vital signs reviewed and stable Respiratory status: spontaneous breathing, nonlabored ventilation, respiratory function stable and patient connected to nasal cannula oxygen Cardiovascular status: blood pressure returned to baseline and stable Postop Assessment: no signs of nausea or vomiting Anesthetic complications: no    Last Vitals:  Filed Vitals:   10/23/15 1134 10/23/15 1144  BP: 124/93 125/89  Pulse: 73 70  Temp:    Resp: 20 19    Last Pain:  Filed Vitals:   10/23/15 1145  PainSc: [place]

## 2015-10-23 NOTE — Transfer of Care (Signed)
Immediate Anesthesia Transfer of Care Note  Patient: Thomas Mercado  Procedure(s) Performed: Procedure(s): COLONOSCOPY WITH PROPOFOL (N/A)  Patient Location: PACU  Anesthesia Type:General  Level of Consciousness: sedated  Airway & Oxygen Therapy: Patient Spontanous Breathing and Patient connected to nasal cannula oxygen  Post-op Assessment: Report given to RN and Post -op Vital signs reviewed and stable  Post vital signs: Reviewed and stable  Last Vitals:  Filed Vitals:   10/23/15 0805 10/23/15 1113  BP: 124/90 107/78  Pulse: 80 71  Temp: 36.3 C 35.8 C  Resp: 14 15    Last Pain: There were no vitals filed for this visit.       Complications: No apparent anesthesia complications

## 2015-10-23 NOTE — H&P (Signed)
Outpatient short stay form Pre-procedure 10/23/2015 9:26 AM Lollie Sails MD  Primary Physician: Dr. Kelli Churn  Reason for visit:  Screening Colonoscopy  History of present illness:  This patient is a 60 year old male presenting today for colonoscopy. He tolerated his prep well. He takes 81 mg aspirin evidence has held that for several days. He takes no other aspirin products or blood thinning agents.    Current facility-administered medications:  .  0.9 %  sodium chloride infusion, , Intravenous, Continuous, Lollie Sails, MD .  0.9 %  sodium chloride infusion, , Intravenous, Continuous, Lollie Sails, MD  Prescriptions prior to admission  Medication Sig Dispense Refill Last Dose  . esomeprazole (NEXIUM) 40 MG capsule Take 1 capsule (40 mg total) by mouth daily at 12 noon. 90 capsule 4 10/22/2015 at Unknown time  . fexofenadine (ALLEGRA) 180 MG tablet Take 1 tablet (180 mg total) by mouth daily. 90 tablet 4 10/22/2015 at Unknown time  . fluticasone (FLONASE) 50 MCG/ACT nasal spray Place 2 sprays into both nostrils daily. 48 g 4 10/22/2015 at Unknown time  . pravastatin (PRAVACHOL) 20 MG tablet Take 1 tablet (20 mg total) by mouth daily. 90 tablet 4 10/21/2015 at Unknown time  . aspirin EC 81 MG tablet Take by mouth.   10/21/2015  . fexofenadine (ALLEGRA) 60 MG tablet Take 60 mg by mouth daily.   Taking     Allergies  Allergen Reactions  . Penicillin G Benzathine Rash    As a child     Past Medical History  Diagnosis Date  . Allergy   . Heart murmur   . ED (erectile dysfunction)   . GERD (gastroesophageal reflux disease)   . Dysrhythmia   . Hypertension   . Sleep apnea   . Hyperlipidemia     Review of systems:      Physical Exam    Heart and lungs: Regular rate and rhythm without rub or gallop, lungs are bilaterally clear.    HEENT: Normocephalic atraumatic eyes are anicteric    Other:     Pertinant exam for procedure: Soft nontender nondistended bowel  sounds positive normoactive.    Planned proceedures: Colonoscopy and indicated procedures. I have discussed the risks benefits and complications of procedures to include not limited to bleeding, infection, perforation and the risk of sedation and the patient wishes to proceed.    Lollie Sails, MD Gastroenterology 10/23/2015  9:26 AM

## 2015-10-23 NOTE — Op Note (Signed)
[facility] Gastroenterology Patient Name: Thomas Mercado Procedure Date: 10/23/2015 10:46 AM MRN: [id number] Account #: [id number] Date of Birth: Jul 09, 1955 Admit Type: Outpatient Age: 60 Room: [facility] ENDO ROOM 4 Gender: Male Note Status: Finalized Procedure:            Colonoscopy Indications:          Screening for colorectal malignant neoplasm Providers:            Lollie Sails, MD Referring MD:         Guadalupe Maple, MD (Referring MD) Medicines:            Monitored Anesthesia Care Complications:        No immediate complications. Procedure:            Pre-Anesthesia Assessment:                       - ASA Grade Assessment: III - A patient with severe                        systemic disease.                       After obtaining informed consent, the colonoscope was                        passed under direct vision. Throughout the procedure,                        the patient's blood pressure, pulse, and oxygen                        saturations were monitored continuously. The                        Colonoscope was introduced through the anus and                        advanced to the the cecum, identified by appendiceal                        orifice and ileocecal valve. The colonoscopy was                        performed without difficulty. The patient tolerated the                        procedure well. The quality of the bowel preparation                        was good. Findings:      A 2 mm polyp was found in the descending colon. The polyp was sessile.       The polyp was removed with a cold biopsy forceps. Resection and       retrieval were complete.      A 2 mm polyp was found in the distal rectum. The polyp was sessile. The       polyp was removed with a cold biopsy forceps. Resection and retrieval       were complete.      The exam was otherwise without abnormality.      The retroflexed view of  the distal rectum and anal verge was normal  and       showed no anal or rectal abnormalities.      The digital rectal exam was normal. Impression:           - One 2 mm polyp in the descending colon, removed with                        a cold biopsy forceps. Resected and retrieved.                       - One 2 mm polyp in the rectum, removed with a cold                        biopsy forceps. Resected and retrieved.                       - The examination was otherwise normal.                       - The distal rectum and anal verge are normal on                        retroflexion view. Recommendation:       - Discharge patient to home. Procedure Code(s):    --- Professional ---                       [phone], Colonoscopy, flexible; with biopsy, single or                        multiple Diagnosis Code(s):    --- Professional ---                       Z12.11, Encounter for screening for malignant neoplasm                        of colon                       D12.4, Benign neoplasm of descending colon                       K62.1, Rectal polyp CPT copyright 2016 American Medical Association. All rights reserved. The codes documented in this report are preliminary and upon coder review may  be revised to meet current compliance requirements. Lollie Sails, MD 10/23/2015 11:12:06 AM This report has been signed electronically. Number of Addenda: 0 Note Initiated On: 10/23/2015 10:46 AM Scope Withdrawal Time: 0 hours 9 minutes 11 seconds  Total Procedure Duration: 0 hours 16 minutes 46 seconds       [facility]

## 2015-10-23 NOTE — Anesthesia Procedure Notes (Signed)
Date/Time: 10/23/2015 10:46 AM Performed by: Johnna Acosta Pre-anesthesia Checklist: Patient identified, Emergency Drugs available, Suction available, Patient being monitored and Timeout performed Patient Re-evaluated:Patient Re-evaluated prior to inductionOxygen Delivery Method: Nasal cannula

## 2015-10-25 ENCOUNTER — Encounter: Payer: Self-pay | Admitting: Gastroenterology

## 2015-10-26 LAB — SURGICAL PATHOLOGY

## 2015-12-17 ENCOUNTER — Emergency Department
Admission: EM | Admit: 2015-12-17 | Discharge: 2015-12-17 | Disposition: A | Discharge status: Home / Self Care | Payer: Managed Care, Other (non HMO) | Attending: Emergency Medicine | Admitting: Emergency Medicine

## 2015-12-17 ENCOUNTER — Ambulatory Visit (INDEPENDENT_AMBULATORY_CARE_PROVIDER_SITE_OTHER)
Admission: EM | Admit: 2015-12-17 | Discharge: 2015-12-17 | Disposition: A | Discharge status: Other Acute Inpatient Hospital | Payer: Managed Care, Other (non HMO) | Source: Home / Self Care | Attending: Family Medicine | Admitting: Family Medicine

## 2015-12-17 DIAGNOSIS — E785 Hyperlipidemia, unspecified: Secondary | ICD-10-CM

## 2015-12-17 DIAGNOSIS — Z7951 Long term (current) use of inhaled steroids: Secondary | ICD-10-CM | POA: Insufficient documentation

## 2015-12-17 DIAGNOSIS — Z7982 Long term (current) use of aspirin: Secondary | ICD-10-CM | POA: Insufficient documentation

## 2015-12-17 DIAGNOSIS — G4730 Sleep apnea, unspecified: Secondary | ICD-10-CM

## 2015-12-17 DIAGNOSIS — Z79899 Other long term (current) drug therapy: Secondary | ICD-10-CM

## 2015-12-17 DIAGNOSIS — R197 Diarrhea, unspecified: Secondary | ICD-10-CM

## 2015-12-17 DIAGNOSIS — Z8249 Family history of ischemic heart disease and other diseases of the circulatory system: Secondary | ICD-10-CM

## 2015-12-17 DIAGNOSIS — I4891 Unspecified atrial fibrillation: Secondary | ICD-10-CM

## 2015-12-17 DIAGNOSIS — R1032 Left lower quadrant pain: Secondary | ICD-10-CM | POA: Diagnosis not present

## 2015-12-17 DIAGNOSIS — I10 Essential (primary) hypertension: Secondary | ICD-10-CM | POA: Insufficient documentation

## 2015-12-17 DIAGNOSIS — Z803 Family history of malignant neoplasm of breast: Secondary | ICD-10-CM

## 2015-12-17 DIAGNOSIS — Z880 Allergy status to penicillin: Secondary | ICD-10-CM | POA: Insufficient documentation

## 2015-12-17 DIAGNOSIS — K219 Gastro-esophageal reflux disease without esophagitis: Secondary | ICD-10-CM

## 2015-12-17 DIAGNOSIS — Z8679 Personal history of other diseases of the circulatory system: Secondary | ICD-10-CM

## 2015-12-17 LAB — URINALYSIS COMPLETE WITH MICROSCOPIC (ARMC ONLY)
Bacteria, UA: NONE SEEN
Bilirubin Urine: NEGATIVE
Glucose, UA: NEGATIVE mg/dL
Ketones, ur: NEGATIVE mg/dL
Nitrite: NEGATIVE
Protein, ur: 30 mg/dL — AB
Specific Gravity, Urine: 1.023 (ref 1.005–1.030)
Squamous Epithelial / LPF: NONE SEEN
pH: 6 (ref 5.0–8.0)

## 2015-12-17 LAB — COMPREHENSIVE METABOLIC PANEL
ALT: 47 U/L (ref 17–63)
AST: 38 U/L (ref 15–41)
Albumin: 5 g/dL (ref 3.5–5.0)
Alkaline Phosphatase: 87 U/L (ref 38–126)
Anion gap: 9 (ref 5–15)
BUN: 15 mg/dL (ref 6–20)
CO2: 24 mmol/L (ref 22–32)
Calcium: 9.3 mg/dL (ref 8.9–10.3)
Chloride: 101 mmol/L (ref 101–111)
Creatinine, Ser: 1.21 mg/dL (ref 0.61–1.24)
GFR calc Af Amer: >60 mL/min (ref >60)
GFR calc non Af Amer: >60 mL/min (ref >60)
Glucose, Bld: 117 mg/dL — ABNORMAL HIGH (ref 65–99)
Potassium: 4.1 mmol/L (ref 3.5–5.1)
Sodium: 134 mmol/L — ABNORMAL LOW (ref 135–145)
Total Bilirubin: 1.3 mg/dL — ABNORMAL HIGH (ref 0.3–1.2)
Total Protein: 7.7 g/dL (ref 6.5–8.1)

## 2015-12-17 LAB — CBC
HCT: 44.5 % (ref 40.0–52.0)
Hemoglobin: 15.4 g/dL (ref 13.0–18.0)
MCH: 30.5 pg (ref 26.0–34.0)
MCHC: 34.5 g/dL (ref 32.0–36.0)
MCV: 88.4 fL (ref 80.0–100.0)
Platelets: 152 10*3/uL (ref 150–440)
RBC: 5.04 MIL/uL (ref 4.40–5.90)
RDW: 13.8 % (ref 11.5–14.5)
WBC: 8.7 10*3/uL (ref 3.8–10.6)

## 2015-12-17 LAB — TROPONIN I: Troponin I: <0.03 ng/mL (ref <0.03)

## 2015-12-17 LAB — LIPASE, BLOOD: Lipase: 28 U/L (ref 11–51)

## 2015-12-17 MED ORDER — ONDANSETRON HCL 4 MG PO TABS: 4.0000 mg | ORAL_TABLET | Freq: Three times a day (TID) | ORAL | Status: DC | PRN

## 2015-12-17 MED ORDER — IBUPROFEN 800 MG PO TABS
800.0000 mg | ORAL_TABLET | Freq: Once | ORAL | Status: AC
  Administered 2015-12-17: 800 mg via ORAL

## 2015-12-17 MED ORDER — SUCRALFATE 1 G PO TABS: 1.0000 g | ORAL_TABLET | Freq: Four times a day (QID) | ORAL | Status: DC

## 2015-12-17 MED ORDER — ACETAMINOPHEN 325 MG PO TABS
ORAL_TABLET | ORAL | Status: AC
  Filled 2015-12-17: qty 2

## 2015-12-17 MED ORDER — ACETAMINOPHEN 325 MG PO TABS
650.0000 mg | ORAL_TABLET | Freq: Once | ORAL | Status: AC
  Administered 2015-12-17: 650 mg via ORAL

## 2015-12-17 NOTE — ED Notes (Signed)
Pt in with co chills and body aches since yest. Has had diarrhea no vomiting or dysuria. Pt denies any pain at this time, was sent here from [facility] urgent care.

## 2015-12-17 NOTE — ED Provider Notes (Signed)
[facility] Emergency Department Provider Note  ____________________________________________  Time seen: ~2030  I have reviewed the triage vital signs and the nursing notes.   HISTORY  Chief Complaint Diarrhea   History limited by: Not Limited   HPI Thomas Mercado is a 60 y.o. male who presents to the emergency department today from urgent care because of diarrhea. The patient developed diarrhea last night. Patient denies any associated nausea or vomiting. Patient denies abdominal pain patient denies bloody diarrhea. Patient states started feeling bad yesterday afternoon after eating [employer]. Urgent care today there was some concern for left-sided abdominal tenderness. Was sent here for further evaluation.    Past Medical History  Diagnosis Date  . Allergy   . Heart murmur   . ED (erectile dysfunction)   . GERD (gastroesophageal reflux disease)   . Dysrhythmia   . Hypertension   . Sleep apnea   . Hyperlipidemia   . Atrial fibrillation [facility])     [facility]    Patient Active Problem List   Diagnosis Date Noted  . Knee pain, left 04/06/2015  . ED (erectile dysfunction) 03/10/2015  . Allergic rhinitis 03/10/2015  . Hypogonadism in male 03/10/2015  . Non-alcoholic fatty liver disease 03/10/2015  . Obstructive apnea 03/12/2013  . Acid reflux 02/07/2013  . Borderline blood pressure 02/07/2013  . HLD (hyperlipidemia) 02/07/2013    Past Surgical History  Procedure Laterality Date  . Ablation  Sept 2014    heart  . Knee arthroscopy Left 04/09/2015    Procedure: ,left knee arthroscopy, limited synovectomy and partial medial menisectomy.;  Surgeon: Thornton Park, MD;  Location: [facility] ORS;  Service: Orthopedics;  Laterality: Left;  . Varicocele excision    . Colonoscopy with propofol N/A 10/23/2015    Procedure: COLONOSCOPY WITH PROPOFOL;  Surgeon: Lollie Sails, MD;  Location: [facility] ENDOSCOPY;  Service: Endoscopy;   Laterality: N/A;    Current Outpatient Rx  Name  Route  Sig  Dispense  Refill  . aspirin EC 81 MG tablet   Oral   Take by mouth.         . esomeprazole (NEXIUM) 40 MG capsule   Oral   Take 1 capsule (40 mg total) by mouth daily at 12 noon.   90 capsule   4   . fexofenadine (ALLEGRA) 180 MG tablet   Oral   Take 1 tablet (180 mg total) by mouth daily.   90 tablet   4   . fexofenadine (ALLEGRA) 60 MG tablet   Oral   Take 60 mg by mouth daily.         . fluticasone (FLONASE) 50 MCG/ACT nasal spray   Each Nare   Place 2 sprays into both nostrils daily.   48 g   4   . pravastatin (PRAVACHOL) 20 MG tablet   Oral   Take 1 tablet (20 mg total) by mouth daily.   90 tablet   4     Allergies Penicillin g benzathine  Family History  Problem Relation Age of Onset  . Cancer Mother     breast  . Alzheimer's disease Father   . Hypertension Father   . Arthritis Father   . Heart disease Brother     Social History Social History  Substance Use Topics  . Smoking status: Never Smoker   . Smokeless tobacco: Never Used  . Alcohol Use: 0.0 - 2.4 oz/week    0-2 Cans of beer, 0-2 Shots of liquor, 0  Standard drinks or equivalent per week     Comment: on occasion    Review of Systems  Constitutional: Negative for fever. Cardiovascular: Negative for chest pain. Respiratory: Negative for shortness of breath. Gastrointestinal: Negative for abdominal pain, vomiting. Positive for diarrhea.  Neurological: Negative for headaches, focal weakness or numbness.  10-point ROS otherwise negative.  ____________________________________________   PHYSICAL EXAM:  VITAL SIGNS: ED Triage Vitals  Enc Vitals Group     BP 12/17/15 1942 119/78 mmHg     Pulse Rate 12/17/15 1942 110     Resp 12/17/15 1942 18     Temp 12/17/15 1942 100.6 F (38.1 C)     Temp Source 12/17/15 1942 Oral     SpO2 12/17/15 1942 94 %     Weight 12/17/15 1942 265 lb (120.203 kg)     Height 12/17/15  1942 6\' 4"  (1.93 m)     Head Cir --      Peak Flow --      Pain Score 12/17/15 1945 4   Constitutional: Alert and oriented. Well appearing and in no distress.  Eyes: Conjunctivae are normal. PERRL. Normal extraocular movements. ENT   Head: Normocephalic and atraumatic.   Nose: No congestion/rhinnorhea.   Mouth/Throat: Mucous membranes are moist.   Neck: No stridor. Hematological/Lymphatic/Immunilogical: No cervical lymphadenopathy. Cardiovascular: Normal rate, regular rhythm.  No murmurs, rubs, or gallops. Respiratory: Normal respiratory effort without tachypnea nor retractions. Breath sounds are clear and equal bilaterally. No wheezes/rales/rhonchi. Gastrointestinal: Soft and nontender. No distention. No tympany. Genitourinary: Deferred Musculoskeletal: Normal range of motion in all extremities. No joint effusions.  No lower extremity tenderness nor edema. Neurologic:  Normal speech and language. No gross focal neurologic deficits are appreciated.  Skin:  Skin is warm, dry and intact. No rash noted. Psychiatric: Mood and affect are normal. Speech and behavior are normal. Patient exhibits appropriate insight and judgment.  ____________________________________________    LABS (pertinent positives/negatives)  Labs Reviewed  COMPREHENSIVE METABOLIC PANEL - Abnormal; Notable for the following:    Sodium 134 (*)    Glucose, Bld 117 (*)    Total Bilirubin 1.3 (*)    All other components within normal limits  URINALYSIS COMPLETEWITH MICROSCOPIC ([facility] ONLY) - Abnormal; Notable for the following:    Color, Urine YELLOW (*)    APPearance CLEAR (*)    Hgb urine dipstick 1+ (*)    Protein, ur 30 (*)    Leukocytes, UA TRACE (*)    All other components within normal limits  CBC  TROPONIN I  LIPASE, BLOOD     ____________________________________________   EKG  I, Nance Pear, attending physician, personally viewed and interpreted this EKG  EKG Time: 1956 Rate:  99 Rhythm: normal sinus rhythm Axis: normal Intervals: qtc 413 QRS: narrow ST changes: no st elevation Impression: normal ekg   ____________________________________________    RADIOLOGY  None  ____________________________________________   PROCEDURES  Procedure(s) performed: None  Critical Care performed: No  ____________________________________________   INITIAL IMPRESSION / ASSESSMENT AND PLAN / ED COURSE  Pertinent labs & imaging results that were available during my care of the patient were reviewed by me and considered in my medical decision making (see chart for details).  Patient presented to the emergency department today for diarrhea. On exam patient's abdominal exam is benign, without any tenderness or distention or rigidity. Patient without any leukocytosis on blood work. At this time did discuss pros and cons of CT with patient. At this point feel CT unnecessary given  lack of leukocytosis or tenderness on exam. Did discuss appendicitis return precautions. Patient comfortable with plan.  ____________________________________________   FINAL CLINICAL IMPRESSION(S) / ED DIAGNOSES  Final diagnoses:  Diarrhea, unspecified type     Note: This dictation was prepared with Dragon dictation. Any transcriptional errors that result from this process are unintentional    Nance Pear, MD 12/17/15 2142

## 2015-12-17 NOTE — ED Notes (Addendum)
Call received from Dr. Alveta Heimlich regarding patient. Patient presents today with c/o nausea and diarrhea. PMH significant for A.fib - sees Fath, MD tomorrow for ablation workup; has had ablation in the past. MD unable to CT patient this evening - sending over for contrast CT to r/o SBO and/or diverticulitis due to BS markedly HYPOactive per MD report. MD also concerned about electrolytes; patient will need to have labs here. Patient to arrive via POV from [place].

## 2015-12-17 NOTE — ED Notes (Addendum)
Pt reports "diarrhea all day, had a fever at home, lack of energy, headache and chills...that started last night".

## 2015-12-17 NOTE — ED Provider Notes (Signed)
CSN: [id number]     Arrival date & time 12/17/15  1830 History   First MD Initiated Contact with Patient 12/17/15 1849    Nurses notes were reviewed. Chief Complaint  Patient presents with  . Diarrhea   (Consider location/radiation/quality/duration/timing/severity/associated sxs/prior Treatment) Patient is a 60 y.o. male presenting with diarrhea. The history is provided by the patient and the spouse. No language interpreter was used.  Diarrhea Quality:  Semi-solid Severity:  Severe Onset quality:  Sudden Progression:  Worsening Relieved by:  Nothing Ineffective treatments:  None tried Associated symptoms: abdominal pain   Associated symptoms: no vomiting   Risk factors: suspect food intake   Risk factors: no recent antibiotic use, no sick contacts and no travel to endemic areas    Patient is here because of diarrhea. According to him and his wife he ate at [facility] this afternoon started having diarrheal last night and some abdominal pain and cramping. His wife went to work and he had diarrhea all day long he feels wasted and washed out now. He also has history A. fib he was diagnosed A. fib and was supposed to have of evaluation by the cardiac surgeon tomorrow to reevaluate his A. fib. He's had ablation for anatomic month doing ablation again. He's worn a Holter monitor for about a month. There are thickened he may have food poisoning because he got sick after eating [employer] a few hours later. He's also not eaten since started yesterday. He had at least 7 loose stools today.  History A. fib heart murmur heard sleep apnea and hyperlipidemia and A. fib. He's had ablation before mentioned above and his had a colonoscopy. Father's Alzheimer's disease and hypertension and brother with heart disease mother with breast cancer. He has never smoked. Allergic to penicillin  Past Medical History  Diagnosis Date  . Allergy   . Heart murmur   . ED (erectile dysfunction)   . GERD (gastroesophageal  reflux disease)   . Dysrhythmia   . Hypertension   . Sleep apnea   . Hyperlipidemia   . Atrial fibrillation [facility])     [facility]   Past Surgical History  Procedure Laterality Date  . Ablation  Sept 2014    heart  . Knee arthroscopy Left 04/09/2015    Procedure: ,left knee arthroscopy, limited synovectomy and partial medial menisectomy.;  Surgeon: Thornton Park, MD;  Location: [facility] ORS;  Service: Orthopedics;  Laterality: Left;  . Varicocele excision    . Colonoscopy with propofol N/A 10/23/2015    Procedure: COLONOSCOPY WITH PROPOFOL;  Surgeon: Lollie Sails, MD;  Location: [place] ENDOSCOPY;  Service: Endoscopy;  Laterality: N/A;   Family History  Problem Relation Age of Onset  . Cancer Mother     breast  . Alzheimer's disease Father   . Hypertension Father   . Arthritis Father   . Heart disease Brother    Social History  Substance Use Topics  . Smoking status: Never Smoker   . Smokeless tobacco: Never Used  . Alcohol Use: 0.0 - 2.4 oz/week    0-2 Cans of beer, 0-2 Shots of liquor, 0 Standard drinks or equivalent per week     Comment: on occasion    Review of Systems  Gastrointestinal: Positive for abdominal pain and diarrhea. Negative for vomiting.  All other systems reviewed and are negative.   Allergies  Penicillin g benzathine  Home Medications   Prior to Admission medications   Medication Sig Start Date End Date Taking?  Authorizing Provider  aspirin EC 81 MG tablet Take by mouth.   Yes Historical Provider, MD  esomeprazole (NEXIUM) 40 MG capsule Take 1 capsule (40 mg total) by mouth daily at 12 noon. 07/23/15  Yes Guadalupe Maple, MD  fexofenadine (ALLEGRA) 180 MG tablet Take 1 tablet (180 mg total) by mouth daily. 07/23/15  Yes Guadalupe Maple, MD  fluticasone (FLONASE) 50 MCG/ACT nasal spray Place 2 sprays into both nostrils daily. 07/23/15  Yes Guadalupe Maple, MD  pravastatin (PRAVACHOL) 20 MG tablet Take 1 tablet (20 mg total) by  mouth daily. 07/23/15  Yes Guadalupe Maple, MD  fexofenadine (ALLEGRA) 60 MG tablet Take 60 mg by mouth daily.    Historical Provider, MD   Meds Ordered and Administered this Visit   Medications  ibuprofen (ADVIL,MOTRIN) tablet 800 mg (800 mg Oral Given 12/17/15 1850)    BP 111/65 mmHg  Pulse 106  Temp(Src) 101.1 F (38.4 C) (Oral)  Resp 18  Ht 6\' 4"  (1.93 m)  Wt 265 lb (120.203 kg)  BMI 32.27 kg/m2  SpO2 97% No data found.   Physical Exam  Constitutional: He has a sickly appearance. He appears ill.  HENT:  Head: Normocephalic and atraumatic.  Eyes: Conjunctivae are normal. Pupils are equal, round, and reactive to light.  Neck: Normal range of motion. Neck supple.  Cardiovascular: An irregularly irregular rhythm present. Tachycardia present.   Pulmonary/Chest: Effort normal and breath sounds normal.  Abdominal: Soft. He exhibits distension. Bowel sounds are decreased. There is no hepatosplenomegaly. There is tenderness. There is no CVA tenderness. No hernia. Hernia confirmed negative in the ventral area.    Musculoskeletal: Normal range of motion.  Lymphadenopathy:    He has no cervical adenopathy.  Neurological: He is alert.  Skin: Skin is warm, dry and intact.  Psychiatric: He has a normal mood and affect.  Vitals reviewed.   ED Course  Procedures (including critical care time)  Labs Review Labs Reviewed - No data to display  Imaging Review No results found.   Visual Acuity Review  Right Eye Distance:   Left Eye Distance:   Bilateral Distance:    Right Eye Near:   Left Eye Near:    Bilateral Near:         MDM   1. Diarrhea, unspecified type   2. Left lower quadrant pain   3. History of atrial fibrillation    Due to patient's diarrhea left lower quadrant pain and poor bowel sounds and discomfort toward the patient may have diverticulitis even a small bowel obstruction versus food poisoning. Another thing that concerns me is that he looks washed out  and may need to have fluids and lastly because of his history of A. fib he may need electrolyte replacement to keep his A. fib from becoming exacerbated. Complaint his wife that even if we had a CT scan the lateness of the evening after 7 would preclude [place] from doing a 3-4 hours CT scan and at this time there is no CT tech here so that point is moot.    This family they will take him to [facility] ED discussed with charge nurse Gaspar Bidding and he is aware of their pending arrival.  ED ECG REPORT I, Rayland Hamed H, the attending physician, personally viewed and interpreted this ECG.   Date: 12/17/2015  EKG Time:18:55:13  Rate:101  Rhythm: there are no previous tracings available for comparison, sinus tachycardia, PAC's noted  Axis: 0  Intervals:none  ST&T Change:  none    Note: This dictation was prepared with Dragon dictation along with smaller phrase technology. Any transcriptional errors that result from this process are unintentional.    Frederich Cha, MD 12/17/15 2026

## 2015-12-17 NOTE — Discharge Instructions (Signed)
Please seek medical attention for any high fevers, chest pain, shortness of breath, change in behavior, persistent vomiting, bloody stool or any other new or concerning symptoms.   Diarrhea Diarrhea is watery poop (stool). It can make you feel weak, tired, thirsty, or give you a dry mouth (signs of dehydration). Watery poop is a sign of another problem, most often an infection. It often lasts 2-3 days. It can last longer if it is a sign of something serious. Take care of yourself as told by your doctor. HOME CARE   Drink 1 cup (8 ounces) of fluid each time you have watery poop.  Do not drink the following fluids:  Those that contain simple sugars (fructose, glucose, galactose, lactose, sucrose, maltose).  Sports drinks.  Fruit juices.  Whole milk products.  Sodas.  Drinks with caffeine (coffee, tea, soda) or alcohol.  Oral rehydration solution may be used if the doctor says it is okay. You may make your own solution. Follow this recipe:   - teaspoon table salt.   teaspoon baking soda.   teaspoon salt substitute containing potassium chloride.  1 tablespoons sugar.  1 liter (34 ounces) of water.  Avoid the following foods:  High fiber foods, such as raw fruits and vegetables.  Nuts, seeds, and whole grain breads and cereals.   Those that are sweetened with sugar alcohols (xylitol, sorbitol, mannitol).  Try eating the following foods:  Starchy foods, such as rice, toast, pasta, low-sugar cereal, oatmeal, baked potatoes, crackers, and bagels.  Bananas.  Applesauce.  Eat probiotic-rich foods, such as yogurt and milk products that are fermented.  Wash your hands well after each time you have watery poop.  Only take medicine as told by your doctor.  Take a warm bath to help lessen burning or pain from having watery poop. GET HELP RIGHT AWAY IF:   You cannot drink fluids without throwing up (vomiting).  You keep throwing up.  You have blood in your poop, or  your poop looks black and tarry.  You do not pee (urinate) in 6-8 hours, or there is only a small amount of very dark pee.  You have belly (abdominal) pain that gets worse or stays in the same spot (localizes).  You are weak, dizzy, confused, or light-headed.  You have a very bad headache.  Your watery poop gets worse or does not get better.  You have a fever or lasting symptoms for more than 2-3 days.  You have a fever and your symptoms suddenly get worse. MAKE SURE YOU:   Understand these instructions.  Will watch your condition.  Will get help right away if you are not doing well or get worse.   This information is not intended to replace advice given to you by your health care provider. Make sure you discuss any questions you have with your health care provider.   Document Released: 11/23/2007 Document Revised: 06/27/2014 Document Reviewed: 02/12/2012 Elsevier Interactive Patient Education [employer].

## 2015-12-17 NOTE — Discharge Instructions (Signed)
Abdominal Pain, Adult Many things can cause belly (abdominal) pain. Most times, the belly pain is not dangerous. Many cases of belly pain can be watched and treated at home. HOME CARE   Do not take medicines that help you go poop (laxatives) unless told to by your doctor.  Only take medicine as told by your doctor.  Eat or drink as told by your doctor. Your doctor will tell you if you should be on a special diet. GET HELP IF:  You do not know what is causing your belly pain.  You have belly pain while you are sick to your stomach (nauseous) or have runny poop (diarrhea).  You have pain while you pee or poop.  Your belly pain wakes you up at night.  You have belly pain that gets worse or better when you eat.  You have belly pain that gets worse when you eat fatty foods.  You have a fever. GET HELP RIGHT AWAY IF:   The pain does not go away within 2 hours.  You keep throwing up (vomiting).  The pain changes and is only in the right or left part of the belly.  You have bloody or tarry looking poop. MAKE SURE YOU:   Understand these instructions.  Will watch your condition.  Will get help right away if you are not doing well or get worse.   This information is not intended to replace advice given to you by your health care provider. Make sure you discuss any questions you have with your health care provider.   Document Released: 11/23/2007 Document Revised: 06/27/2014 Document Reviewed: 02/13/2013 Elsevier Interactive Patient Education 2016 [place].  Diarrhea Diarrhea is watery poop (stool). It can make you feel weak, tired, thirsty, or give you a dry mouth (signs of dehydration). Watery poop is a sign of another problem, most often an infection. It often lasts 2-3 days. It can last longer if it is a sign of something serious. Take care of yourself as told by your doctor. HOME CARE   Drink 1 cup (8 ounces) of fluid each time you have watery poop.  Do not drink  the following fluids:  Those that contain simple sugars (fructose, glucose, galactose, lactose, sucrose, maltose).  Sports drinks.  Fruit juices.  Whole milk products.  Sodas.  Drinks with caffeine (coffee, tea, soda) or alcohol.  Oral rehydration solution may be used if the doctor says it is okay. You may make your own solution. Follow this recipe:   - teaspoon table salt.   teaspoon baking soda.   teaspoon salt substitute containing potassium chloride.  1 tablespoons sugar.  1 liter (34 ounces) of water.  Avoid the following foods:  High fiber foods, such as raw fruits and vegetables.  Nuts, seeds, and whole grain breads and cereals.   Those that are sweetened with sugar alcohols (xylitol, sorbitol, mannitol).  Try eating the following foods:  Starchy foods, such as rice, toast, pasta, low-sugar cereal, oatmeal, baked potatoes, crackers, and bagels.  Bananas.  Applesauce.  Eat probiotic-rich foods, such as yogurt and milk products that are fermented.  Wash your hands well after each time you have watery poop.  Only take medicine as told by your doctor.  Take a warm bath to help lessen burning or pain from having watery poop. GET HELP RIGHT AWAY IF:   You cannot drink fluids without throwing up (vomiting).  You keep throwing up.  You have blood in your poop, or your poop looks  black and tarry.  You do not pee (urinate) in 6-8 hours, or there is only a small amount of very dark pee.  You have belly (abdominal) pain that gets worse or stays in the same spot (localizes).  You are weak, dizzy, confused, or light-headed.  You have a very bad headache.  Your watery poop gets worse or does not get better.  You have a fever or lasting symptoms for more than 2-3 days.  You have a fever and your symptoms suddenly get worse. MAKE SURE YOU:   Understand these instructions.  Will watch your condition.  Will get help right away if you are not doing well  or get worse.   This information is not intended to replace advice given to you by your health care provider. Make sure you discuss any questions you have with your health care provider.   Document Released: 11/23/2007 Document Revised: 06/27/2014 Document Reviewed: 02/12/2012 Elsevier Interactive Patient Education [employer].

## 2015-12-17 NOTE — ED Notes (Addendum)
Patient complains of diarrhea and nausea since last night. Patient states that this happened after eating a chicken sandwich from [employer]. Patient states that he was up all night with diarrhea. Patient states that he has had body aches and chills. Patient states that he has been camping recently and is not aware of being bit by a tick. Patient states that he has atrial fib and feels like he could be back in it now. Patient states that he is supposed to see Dr. Ubaldo Glassing tomorrow.

## 2015-12-19 HISTORY — PX: ABLATION: SHX5711

## 2016-01-26 ENCOUNTER — Ambulatory Visit (INDEPENDENT_AMBULATORY_CARE_PROVIDER_SITE_OTHER): Payer: Managed Care, Other (non HMO) | Admitting: Family Medicine

## 2016-01-26 ENCOUNTER — Encounter: Payer: Self-pay | Admitting: Family Medicine

## 2016-01-26 VITALS — BP 128/91 | HR 81 | Temp 97.9°F | Ht 74.5 in | Wt 262.0 lb

## 2016-01-26 DIAGNOSIS — K1120 Sialoadenitis, unspecified: Secondary | ICD-10-CM | POA: Diagnosis not present

## 2016-01-26 DIAGNOSIS — Z23 Encounter for immunization: Secondary | ICD-10-CM

## 2016-01-26 DIAGNOSIS — E785 Hyperlipidemia, unspecified: Secondary | ICD-10-CM | POA: Diagnosis not present

## 2016-01-26 LAB — LP+ALT+AST PICCOLO, WAIVED
ALT (SGPT) Piccolo, Waived: 42 U/L (ref 10–47)
AST (SGOT) Piccolo, Waived: 33 U/L (ref 11–38)
Chol/HDL Ratio Piccolo,Waive: 3.5 mg/dL
Cholesterol Piccolo, Waived: 155 mg/dL (ref <200)
HDL Chol Piccolo, Waived: 45 mg/dL — ABNORMAL LOW (ref >59)
LDL Chol Calc Piccolo Waived: 79 mg/dL (ref <100)
Triglycerides Piccolo,Waived: 156 mg/dL — ABNORMAL HIGH (ref <150)
VLDL Chol Calc Piccolo,Waive: 31 mg/dL — ABNORMAL HIGH (ref <30)

## 2016-01-26 MED ORDER — AZITHROMYCIN 250 MG PO TABS: ORAL_TABLET | ORAL | 0 refills | Status: DC

## 2016-01-26 NOTE — Assessment & Plan Note (Signed)
The current medical regimen is effective;  continue present plan and medications.  

## 2016-01-26 NOTE — Patient Instructions (Signed)
Shingles Vaccine: What You Need to Know WHAT IS SHINGLES?  Shingles is a painful skin rash, often with blisters. It is also called Herpes Zoster or just Zoster.  A shingles rash usually appears on one side of the face or body and lasts from 2 to 4 weeks. Its main symptom is pain, which can be quite severe. Other symptoms of shingles can include fever, headache, chills, and upset stomach. Very rarely, a shingles infection can lead to pneumonia, hearing problems, blindness, brain inflammation (encephalitis), or death.  For about 1 person in 5, severe pain can continue even after the rash clears up. This is called post-herpetic neuralgia.  Shingles is caused by the Varicella Zoster virus. This is the same virus that causes chickenpox. Only someone who has had a case of chickenpox or rarely, has gotten chickenpox vaccine, can get shingles. The virus stays in your body. It can reappear many years later to cause a case of shingles.  You cannot catch shingles from another person with shingles. However, a person who has never had chickenpox (or chickenpox vaccine) could get chickenpox from someone with shingles. This is not very common.  Shingles is far more common in people 15 and older than in younger people. It is also more common in people whose immune systems are weakened because of a disease such as cancer or drugs such as steroids or chemotherapy.  At least 1 million people get shingles per year in the [place]. SHINGLES VACCINE  A vaccine for shingles was licensed in 8657. In clinical trials, the vaccine reduced the risk of shingles by 50%. It can also reduce the pain in people who still get shingles after being vaccinated.  A single dose of shingles vaccine is recommended for adults 56 years of age and older. SOME PEOPLE SHOULD NOT GET SHINGLES VACCINE OR SHOULD WAIT A person should not get shingles vaccine if he or she:  Has ever had a life-threatening allergic reaction to gelatin, the  antibiotic neomycin, or any other component of shingles vaccine. Tell your caregiver if you have any severe allergies.  Has a weakened immune system because of current:  AIDS or another disease that affects the immune system.  Treatment with drugs that affect the immune system, such as prolonged use of high-dose steroids.  Cancer treatment, such as radiation or chemotherapy.  Cancer affecting the bone marrow or lymphatic system, such as leukemia or lymphoma.  Is pregnant, or might be pregnant. Women should not become pregnant until at least 4 weeks after getting shingles vaccine. Someone with a minor illness, such as a cold, may be vaccinated. Anyone with a moderate or severe acute illness should usually wait until he or she recovers before getting the vaccine. This includes anyone with a temperature of 101.3 F (38 C) or higher. WHAT ARE THE RISKS FROM SHINGLES VACCINE?  A vaccine, like any medicine, could possibly cause serious problems, such as severe allergic reactions. However, the risk of a vaccine causing serious harm, or death, is extremely small.  No serious problems have been identified with shingles vaccine. Mild Problems  Redness, soreness, swelling, or itching at the site of the injection (about 1 person in 3).  Headache (about 1 person in 5). Like all vaccines, shingles vaccine is being closely monitored for unusual or severe problems. WHAT IF THERE IS A MODERATE OR SEVERE REACTION? What should I look for? Any unusual condition, such as a severe allergic reaction or a high fever. If a severe allergic reaction  problems.  WHAT IF THERE IS A MODERATE OR SEVERE REACTION?  What should I look for?  Any unusual condition, such as a severe allergic reaction or a high fever. If a severe allergic reaction occurred, it would be within a few minutes to an hour after the shot. Signs of a serious allergic reaction can include difficulty breathing, weakness, hoarseness or wheezing, a fast heartbeat, hives, dizziness, paleness, or swelling of the throat.  What should I do?  · Call your caregiver, or get the person to a caregiver right away.  · Tell the caregiver what  happened, the date and time it happened, and when the vaccination was given.  · Ask the caregiver to report the reaction by filing a Vaccine Adverse Event Reporting System (VAERS) form. Or, you can file this report through the VAERS web site at www.vaers.hhs.gov or by calling 1-800-822-7967.  VAERS does not provide medical advice.  HOW CAN I LEARN MORE?  · Ask your caregiver. He or she can give you the vaccine package insert or suggest other sources of information.  · Contact the Centers for Disease Control and Prevention (CDC):    Call 1-800-232-4636 (1-800-CDC-INFO).    Visit the CDC website at www.cdc.gov/vaccines  CDC Shingles Vaccine VIS (03/25/08)     This information is not intended to replace advice given to you by your health care provider. Make sure you discuss any questions you have with your health care provider.     Document Released: 04/03/2006 Document Revised: 10/21/2014 Document Reviewed: 09/26/2012  Elsevier Interactive Patient Education ©2016 Elsevier Inc.

## 2016-01-26 NOTE — Progress Notes (Signed)
BP (!) 128/91 (BP Location: Left Arm, Patient Position: Sitting, Cuff Size: Normal)   Pulse 81   Temp 97.9 F (36.6 C)   Ht 6' 2.5" (1.892 m)   Wt 262 lb (118.8 kg)   SpO2 96%   BMI 33.19 kg/m    Subjective:    Patient ID: Thomas Mercado, male    DOB: 29-Jan-1956, 60 y.o.   MRN: [id number]  HPI: Thomas Mercado is a 60 y.o. male  Chief Complaint  Patient presents with  . Hyperlipidemia  . enlarged gland   Cholesterol doing well no complaints from Pravachol 20 mg taking medicines faithfully without problems Taking Allegra without problems Had episode of atrial fibrillation this winter with ambulation is taking Xarelto will be able to stop that medicine next month hopefully. With follow-up cardiology Has not had any  bleeding bruising complications  About 3 days ago right parotid gland started swelling not associated with food or eating has been to the dentist and no obvious dental causes Dentist was unable to find a parotid stone. Has not felt sick has tried sucking on lemon seemed to help relieve some symptoms didn't increase pain or cough cause increased tightness or size of parotid  Gland. Has not noticed any bad taste in his mouth has had some dry mouth uses his CPAP faithfully with chinstrap.  Relevant past medical, surgical, family and social history reviewed and updated as indicated. Interim medical history since our last visit reviewed. Allergies and medications reviewed and updated.  Review of Systems  Constitutional: Negative.   Respiratory: Negative.   Cardiovascular: Negative.     Per HPI unless specifically indicated above     Objective:    BP (!) 128/91 (BP Location: Left Arm, Patient Position: Sitting, Cuff Size: Normal)   Pulse 81   Temp 97.9 F (36.6 C)   Ht 6' 2.5" (1.892 m)   Wt 262 lb (118.8 kg)   SpO2 96%   BMI 33.19 kg/m   Wt Readings from Last 3 Encounters:  01/26/16 262 lb (118.8 kg)  12/17/15 265 lb (120.2 kg)  12/17/15 265 lb (120.2 kg)      Physical Exam  Constitutional: He is oriented to person, place, and time. He appears well-developed and well-nourished. No distress.  HENT:  Head: Normocephalic and atraumatic.  Right Ear: Hearing normal.  Left Ear: Hearing normal.  Nose: Nose normal.  Right parotid gland swollen nontender Observing Stensen's duct during palpation of parotid gland with no drainage identified  Eyes: Conjunctivae and lids are normal. Right eye exhibits no discharge. Left eye exhibits no discharge. No scleral icterus.  Pulmonary/Chest: Effort normal. No respiratory distress.  Musculoskeletal: Normal range of motion.  Neurological: He is alert and oriented to person, place, and time.  Skin: Skin is intact. No rash noted.  Psychiatric: He has a normal mood and affect. His speech is normal and behavior is normal. Judgment and thought content normal. Cognition and memory are normal.    Results for orders placed or performed during the hospital encounter of 12/17/15  CBC  Result Value Ref Range   WBC 8.7 3.8 - 10.6 K/uL   RBC 5.04 4.40 - 5.90 MIL/uL   Hemoglobin 15.4 13.0 - 18.0 g/dL   HCT 44.5 40.0 - 52.0 %   MCV 88.4 80.0 - 100.0 fL   MCH 30.5 26.0 - 34.0 pg   MCHC 34.5 32.0 - 36.0 g/dL   RDW 13.8 11.5 - 14.5 %   Platelets 152 150 - 440  K/uL  Comprehensive metabolic panel  Result Value Ref Range   Sodium 134 (L) 135 - 145 mmol/L   Potassium 4.1 3.5 - 5.1 mmol/L   Chloride 101 101 - 111 mmol/L   CO2 24 22 - 32 mmol/L   Glucose, Bld 117 (H) 65 - 99 mg/dL   BUN 15 6 - 20 mg/dL   Creatinine, Ser 1.21 0.61 - 1.24 mg/dL   Calcium 9.3 8.9 - 10.3 mg/dL   Total Protein 7.7 6.5 - 8.1 g/dL   Albumin 5.0 3.5 - 5.0 g/dL   AST 38 15 - 41 U/L   ALT 47 17 - 63 U/L   Alkaline Phosphatase 87 38 - 126 U/L   Total Bilirubin 1.3 (H) 0.3 - 1.2 mg/dL   GFR calc non Af Amer >60 >60 mL/min   GFR calc Af Amer >60 >60 mL/min   Anion gap 9 5 - 15  Urinalysis complete, with microscopic ([facility] only)  Result Value Ref  Range   Color, Urine YELLOW (A) YELLOW   APPearance CLEAR (A) CLEAR   Glucose, UA NEGATIVE NEGATIVE mg/dL   Bilirubin Urine NEGATIVE NEGATIVE   Ketones, ur NEGATIVE NEGATIVE mg/dL   Specific Gravity, Urine 1.023 1.005 - 1.030   Hgb urine dipstick 1+ (A) NEGATIVE   pH 6.0 5.0 - 8.0   Protein, ur 30 (A) NEGATIVE mg/dL   Nitrite NEGATIVE NEGATIVE   Leukocytes, UA TRACE (A) NEGATIVE   RBC / HPF 0-5 0 - 5 RBC/hpf   WBC, UA 0-5 0 - 5 WBC/hpf   Bacteria, UA NONE SEEN NONE SEEN   Squamous Epithelial / LPF NONE SEEN NONE SEEN   Mucous PRESENT   Troponin I  Result Value Ref Range   Troponin I <0.03 <0.03 ng/mL  Lipase, blood  Result Value Ref Range   Lipase 28 11 - 51 U/L      Assessment & Plan:   Problem List Items Addressed This Visit      Digestive   Infection of parotid gland    Discuss parotitis will start z-pak  use lemon drops and appointment with ENT.      Relevant Medications   azithromycin (ZITHROMAX) 250 MG tablet   Other Relevant Orders   Ambulatory referral to ENT     Other   HLD (hyperlipidemia) - Primary    The current medical regimen is effective;  continue present plan and medications.       Relevant Medications   rivaroxaban (XARELTO) 20 MG TABS tablet   Other Relevant Orders   LP+ALT+AST Piccolo, Waived    Other Visit Diagnoses    Need for zoster vaccination       Relevant Orders   Varicella-zoster vaccine subcutaneous (Completed)       Follow up plan: Return in about 6 months (around 07/28/2016) for Physical Exam.

## 2016-01-26 NOTE — Assessment & Plan Note (Addendum)
Discuss parotitis will start z-pak  use lemon drops and appointment with ENT.

## 2016-07-25 ENCOUNTER — Encounter: Payer: Managed Care, Other (non HMO) | Admitting: Family Medicine

## 2016-08-04 ENCOUNTER — Encounter: Payer: Self-pay | Admitting: Family Medicine

## 2016-08-04 ENCOUNTER — Ambulatory Visit (INDEPENDENT_AMBULATORY_CARE_PROVIDER_SITE_OTHER): Payer: Commercial Managed Care - PPO | Admitting: Family Medicine

## 2016-08-04 VITALS — BP 127/88 | HR 103 | Temp 99.3°F | Wt 271.0 lb

## 2016-08-04 DIAGNOSIS — J101 Influenza due to other identified influenza virus with other respiratory manifestations: Secondary | ICD-10-CM | POA: Diagnosis not present

## 2016-08-04 LAB — VERITOR FLU A/B WAIVED
Influenza A: NEGATIVE
Influenza B: POSITIVE — AB

## 2016-08-04 MED ORDER — OSELTAMIVIR PHOSPHATE 75 MG PO CAPS: 75.0000 mg | ORAL_CAPSULE | Freq: Two times a day (BID) | ORAL | 0 refills | Status: DC

## 2016-08-04 NOTE — Progress Notes (Signed)
BP 127/88   Pulse (!) 103   Temp 99.3 F (37.4 C)   Wt 271 lb (122.9 kg)   SpO2 95%   BMI 34.33 kg/m    Subjective:    Patient ID: Thomas Mercado, male    DOB: 09/03/1955, 61 y.o.   MRN: [id number]  HPI: Thomas Mercado is a 61 y.o. male  Chief Complaint  Patient presents with  . URI    x 5 days, did teledoc and was given Tessalon perles and a nasal spray. productive cough, head congestion, felt feverish, body aches, scratchy throat. No ear ache.    Patient presents with 5 day history of low grade fever, body aches, sore throat, productive cough, and congestion. Tried the teledoc and was given flonase and tessalon perles with minimal relief. Has been taking ibuprofen pretty regularly, last dose this morning. Denies CP, SOB, wheezing. Several sick contacts.   Past Medical History:  Diagnosis Date  . Allergy   . Atrial fibrillation [facility]  . Dysrhythmia   . ED (erectile dysfunction)   . GERD (gastroesophageal reflux disease)   . Heart murmur   . Hyperlipidemia   . Hypertension   . Sleep apnea    Social History   Social History  . Marital status: Married    Spouse name: N/A  . Number of children: N/A  . Years of education: N/A   Occupational History  . Not on file.   Social History Main Topics  . Smoking status: Never Smoker  . Smokeless tobacco: Never Used  . Alcohol use 0.0 - 2.4 oz/week     Comment: on occasion  . Drug use: No  . Sexual activity: Yes   Other Topics Concern  . Not on file   Social History Narrative  . No narrative on file    Relevant past medical, surgical, family and social history reviewed and updated as indicated. Interim medical history since our last visit reviewed. Allergies and medications reviewed and updated.  Review of Systems  Constitutional: Positive for fatigue and fever.  HENT: Positive for congestion and sore throat.   Eyes: Negative.   Respiratory: Positive for cough.     Cardiovascular: Negative.   Gastrointestinal: Negative.   Musculoskeletal: Positive for myalgias.  Neurological: Negative.   Psychiatric/Behavioral: Negative.     Per HPI unless specifically indicated above     Objective:    BP 127/88   Pulse (!) 103   Temp 99.3 F (37.4 C)   Wt 271 lb (122.9 kg)   SpO2 95%   BMI 34.33 kg/m   Wt Readings from Last 3 Encounters:  08/04/16 271 lb (122.9 kg)  01/26/16 262 lb (118.8 kg)  12/17/15 265 lb (120.2 kg)    Physical Exam  Constitutional: He is oriented to person, place, and time. He appears well-developed and well-nourished.  HENT:  Head: Atraumatic.  Right TM injected Oropharynx erythematous  Eyes: Conjunctivae are normal. Pupils are equal, round, and reactive to light. No scleral icterus.  Neck: Normal range of motion. Neck supple.  Cardiovascular: Normal rate and normal heart sounds.   Musculoskeletal: Normal range of motion.  Neurological: He is alert and oriented to person, place, and time.  Skin: Skin is warm and dry.  Psychiatric: He has a normal mood and affect. His behavior is normal.  Nursing note and vitals reviewed.     Assessment & Plan:   Problem List Items Addressed This Visit  None    Visit Diagnoses    Influenza B    -  Primary   Discussed tamiflu utility out of window, pt wishes to go ahead and take it. sudafed, flonase, tessalon perles prn. Supportive care discussed.    Relevant Medications   oseltamivir (TAMIFLU) 75 MG capsule   Other Relevant Orders   Influenza A & B (STAT)       Follow up plan: Return if symptoms worsen or fail to improve.

## 2016-08-04 NOTE — Patient Instructions (Signed)
Follow up as needed

## 2016-08-08 ENCOUNTER — Encounter: Payer: Self-pay | Admitting: Family Medicine

## 2016-08-08 NOTE — Telephone Encounter (Signed)
Please see mychart message and advise.

## 2016-08-18 ENCOUNTER — Other Ambulatory Visit: Payer: Self-pay | Admitting: Family Medicine

## 2016-08-18 DIAGNOSIS — E785 Hyperlipidemia, unspecified: Secondary | ICD-10-CM

## 2016-08-18 DIAGNOSIS — K219 Gastro-esophageal reflux disease without esophagitis: Secondary | ICD-10-CM

## 2016-08-18 NOTE — Telephone Encounter (Signed)
Last OV: 08/04/16 Next OV: 09/21/16  Lab Results  Component Value Date   CHOL 155 01/26/2016   HDL 44 07/23/2015   LDLCALC 91 07/23/2015   TRIG 156 (H) 01/26/2016   Lab Results  Component Value Date   CREATININE 1.21 12/17/2015   BUN 15 12/17/2015   NA 134 (L) 12/17/2015   K 4.1 12/17/2015   CL 101 12/17/2015   CO2 24 12/17/2015

## 2016-09-21 ENCOUNTER — Encounter: Payer: Self-pay | Admitting: Family Medicine

## 2016-09-21 ENCOUNTER — Ambulatory Visit (INDEPENDENT_AMBULATORY_CARE_PROVIDER_SITE_OTHER): Payer: Commercial Managed Care - PPO | Admitting: Family Medicine

## 2016-09-21 VITALS — BP 138/96 | HR 87 | Ht 74.8 in | Wt 274.0 lb

## 2016-09-21 DIAGNOSIS — Z125 Encounter for screening for malignant neoplasm of prostate: Secondary | ICD-10-CM | POA: Diagnosis not present

## 2016-09-21 DIAGNOSIS — K219 Gastro-esophageal reflux disease without esophagitis: Secondary | ICD-10-CM

## 2016-09-21 DIAGNOSIS — Z13220 Encounter for screening for lipoid disorders: Secondary | ICD-10-CM | POA: Diagnosis not present

## 2016-09-21 DIAGNOSIS — E785 Hyperlipidemia, unspecified: Secondary | ICD-10-CM

## 2016-09-21 DIAGNOSIS — Z1329 Encounter for screening for other suspected endocrine disorder: Secondary | ICD-10-CM | POA: Diagnosis not present

## 2016-09-21 DIAGNOSIS — Z0000 Encounter for general adult medical examination without abnormal findings: Secondary | ICD-10-CM | POA: Diagnosis not present

## 2016-09-21 DIAGNOSIS — R030 Elevated blood-pressure reading, without diagnosis of hypertension: Secondary | ICD-10-CM

## 2016-09-21 DIAGNOSIS — G4733 Obstructive sleep apnea (adult) (pediatric): Secondary | ICD-10-CM | POA: Diagnosis not present

## 2016-09-21 DIAGNOSIS — E291 Testicular hypofunction: Secondary | ICD-10-CM

## 2016-09-21 LAB — URINALYSIS, ROUTINE W REFLEX MICROSCOPIC
Bilirubin, UA: NEGATIVE
Glucose, UA: NEGATIVE
Ketones, UA: NEGATIVE
Nitrite, UA: NEGATIVE
Protein, UA: NEGATIVE
Specific Gravity, UA: 1.02 (ref 1.005–1.030)
Urobilinogen, Ur: 1 mg/dL (ref 0.2–1.0)
pH, UA: 6.5 (ref 5.0–7.5)

## 2016-09-21 LAB — MICROSCOPIC EXAMINATION
Bacteria, UA: NONE SEEN
RBC, UA: NONE SEEN /hpf (ref 0–?)
WBC, UA: NONE SEEN /hpf (ref 0–?)

## 2016-09-21 MED ORDER — FLUTICASONE PROPIONATE 50 MCG/ACT NA SUSP: 2.0000 | Freq: Every day | NASAL | 4 refills | Status: DC

## 2016-09-21 MED ORDER — PRAVASTATIN SODIUM 20 MG PO TABS: 20.0000 mg | ORAL_TABLET | Freq: Every day | ORAL | 4 refills | Status: DC

## 2016-09-21 MED ORDER — ESOMEPRAZOLE MAGNESIUM 40 MG PO CPDR: 40.0000 mg | DELAYED_RELEASE_CAPSULE | Freq: Every day | ORAL | 4 refills | Status: DC

## 2016-09-21 MED ORDER — FEXOFENADINE HCL 180 MG PO TABS
180.0000 mg | ORAL_TABLET | Freq: Every day | ORAL | 4 refills | Status: DC
Start: 2016-09-21 — End: 2017-11-28

## 2016-09-21 NOTE — Assessment & Plan Note (Signed)
The current medical regimen is effective;  continue present plan and medications.  

## 2016-09-21 NOTE — Progress Notes (Signed)
BP (!) 138/96   Pulse 87   Ht 6' 2.8" (1.9 m)   Wt 274 lb (124.3 kg)   SpO2 98%   BMI 34.43 kg/m    Subjective:    Patient ID: Thomas Mercado, male    DOB: 01-06-1956, 61 y.o.   MRN: [id number]  HPI: Thomas Mercado is a 61 y.o. male  Chief Complaint  Patient presents with  . Annual Exam  . Pain    Joints, worsening.    Patient with arthritis complaints primarily hips and knees. Worse in the morning sometimes with weather change. Hasn't really tried much medication. Discuss that Pravachol is mostly myalgias but may be contributing. On Allegra Flonase doing okay taking Nexium for reflux and doing okay. Also having some issues with aspirin and Pravachol in the evening getting stuck in his throat. Discuss can switch those morning with other medications and watching down with coffee.  Relevant past medical, surgical, family and social history reviewed and updated as indicated. Interim medical history since our last visit reviewed. Allergies and medications reviewed and updated.  Review of Systems  Constitutional: Negative.   HENT: Negative.   Eyes: Negative.   Respiratory: Negative.   Cardiovascular: Negative.   Gastrointestinal: Negative.   Endocrine: Negative.   Genitourinary: Negative.   Musculoskeletal: Negative.   Skin: Negative.   Allergic/Immunologic: Negative.   Neurological: Negative.   Hematological: Negative.   Psychiatric/Behavioral: Negative.     Per HPI unless specifically indicated above     Objective:    BP (!) 138/96   Pulse 87   Ht 6' 2.8" (1.9 m)   Wt 274 lb (124.3 kg)   SpO2 98%   BMI 34.43 kg/m   Wt Readings from Last 3 Encounters:  09/21/16 274 lb (124.3 kg)  08/04/16 271 lb (122.9 kg)  01/26/16 262 lb (118.8 kg)    Physical Exam  Constitutional: He is oriented to person, place, and time. He appears well-developed and well-nourished.  HENT:  Head: Normocephalic and atraumatic.  Right Ear: External ear normal.  Left Ear: External ear  normal.  Eyes: Conjunctivae and EOM are normal. Pupils are equal, round, and reactive to light.  Neck: Normal range of motion. Neck supple.  Cardiovascular: Normal rate, regular rhythm, normal heart sounds and intact distal pulses.   Pulmonary/Chest: Effort normal and breath sounds normal.  Abdominal: Soft. Bowel sounds are normal. There is no splenomegaly or hepatomegaly.  Genitourinary: Rectum normal, prostate normal and penis normal.  Musculoskeletal: Normal range of motion.  Neurological: He is alert and oriented to person, place, and time. He has normal reflexes.  Skin: No rash noted. No erythema.  Psychiatric: He has a normal mood and affect. His behavior is normal. Judgment and thought content normal.        Assessment & Plan:   Problem List Items Addressed This Visit      Respiratory   Obstructive apnea    The current medical regimen is effective;  continue present plan and medications.         Digestive   Acid reflux    The current medical regimen is effective;  continue present plan and medications.       Relevant Medications   esomeprazole (NEXIUM) 40 MG capsule     Endocrine   Hypogonadism in male   Relevant Orders   PSA     Other   Borderline blood pressure   Relevant Orders   CBC with Differential/Platelet   Comprehensive metabolic panel  Urinalysis, Routine w reflex microscopic   HLD (hyperlipidemia)    Discussed cholesterol medicine and myalgias may be contributing to some of arthralgias patient will try stopping pravastatin for 2 weeks observing aches and pains and then restarting and observing symptoms will notify me if Pravachol may be contributing to arthralgias.      Relevant Medications   pravastatin (PRAVACHOL) 20 MG tablet   Other Relevant Orders   CBC with Differential/Platelet   Comprehensive metabolic panel   Lipid panel   Urinalysis, Routine w reflex microscopic    Other Visit Diagnoses    Annual physical exam    -  Primary    Relevant Orders   CBC with Differential/Platelet   Comprehensive metabolic panel   Lipid panel   TSH   PSA   Urinalysis, Routine w reflex microscopic   Screening cholesterol level       Relevant Orders   Lipid panel   Thyroid disorder screen       Relevant Orders   TSH   Prostate cancer screening       Relevant Orders   PSA     Discuss elevated blood pressure with lifestyle accommodation diet exercise nutrition checking blood pressure and if staying elevated will need office visit and medication.  Follow up plan: Return in about 6 months (around 03/23/2017) for BMP,  Lipids, ALT, AST.

## 2016-09-21 NOTE — Assessment & Plan Note (Signed)
Discussed cholesterol medicine and myalgias may be contributing to some of arthralgias patient will try stopping pravastatin for 2 weeks observing aches and pains and then restarting and observing symptoms will notify me if Pravachol may be contributing to arthralgias.

## 2016-09-22 ENCOUNTER — Telehealth: Payer: Self-pay | Admitting: Family Medicine

## 2016-09-22 DIAGNOSIS — R748 Abnormal levels of other serum enzymes: Secondary | ICD-10-CM

## 2016-09-22 LAB — CBC WITH DIFFERENTIAL/PLATELET
Basophils Absolute: 0.1 10*3/uL (ref 0.0–0.2)
Basos: 1 %
EOS (ABSOLUTE): 0.2 10*3/uL (ref 0.0–0.4)
Eos: 3 %
Hematocrit: 42.4 % (ref 37.5–51.0)
Hemoglobin: 14.7 g/dL (ref 13.0–17.7)
Immature Grans (Abs): 0 10*3/uL (ref 0.0–0.1)
Immature Granulocytes: 0 %
Lymphocytes Absolute: 1.6 10*3/uL (ref 0.7–3.1)
Lymphs: 33 %
MCH: 29.6 pg (ref 26.6–33.0)
MCHC: 34.7 g/dL (ref 31.5–35.7)
MCV: 86 fL (ref 79–97)
Monocytes Absolute: 0.4 10*3/uL (ref 0.1–0.9)
Monocytes: 8 %
Neutrophils Absolute: 2.6 10*3/uL (ref 1.4–7.0)
Neutrophils: 55 %
Platelets: 201 10*3/uL (ref 150–379)
RBC: 4.96 x10E6/uL (ref 4.14–5.80)
RDW: 14.3 % (ref 12.3–15.4)
WBC: 4.9 10*3/uL (ref 3.4–10.8)

## 2016-09-22 LAB — COMPREHENSIVE METABOLIC PANEL
ALT: 90 IU/L — ABNORMAL HIGH (ref 0–44)
AST: 60 IU/L — ABNORMAL HIGH (ref 0–40)
Albumin/Globulin Ratio: 2.1 (ref 1.2–2.2)
Albumin: 4.8 g/dL (ref 3.6–4.8)
Alkaline Phosphatase: 97 IU/L (ref 39–117)
BUN/Creatinine Ratio: 15 (ref 10–24)
BUN: 14 mg/dL (ref 8–27)
Bilirubin Total: 0.6 mg/dL (ref 0.0–1.2)
CO2: 22 mmol/L (ref 18–29)
Calcium: 9.6 mg/dL (ref 8.6–10.2)
Chloride: 100 mmol/L (ref 96–106)
Creatinine, Ser: 0.92 mg/dL (ref 0.76–1.27)
GFR calc Af Amer: 103 mL/min/{1.73_m2} (ref >59)
GFR calc non Af Amer: 89 mL/min/{1.73_m2} (ref >59)
Globulin, Total: 2.3 g/dL (ref 1.5–4.5)
Glucose: 89 mg/dL (ref 65–99)
Potassium: 4.4 mmol/L (ref 3.5–5.2)
Sodium: 141 mmol/L (ref 134–144)
Total Protein: 7.1 g/dL (ref 6.0–8.5)

## 2016-09-22 LAB — PSA: Prostate Specific Ag, Serum: 1.2 ng/mL (ref 0.0–4.0)

## 2016-09-22 LAB — TSH: TSH: 1.96 u[IU]/mL (ref 0.450–4.500)

## 2016-09-22 LAB — LIPID PANEL
Chol/HDL Ratio: 4 ratio (ref 0.0–5.0)
Cholesterol, Total: 169 mg/dL (ref 100–199)
HDL: 42 mg/dL (ref >39)
LDL Calculated: 106 mg/dL — ABNORMAL HIGH (ref 0–99)
Triglycerides: 103 mg/dL (ref 0–149)
VLDL Cholesterol Cal: 21 mg/dL (ref 5–40)

## 2016-09-22 NOTE — Telephone Encounter (Signed)
Phone call Discussed with patient elevated liver enzymes patient already in a trial for stopping pravastatin for 2 weeks then restarting. Patient did drink extra alcohol over the holiday weekend. Elevated liver function may be alcohol related. Patient will not drink alcohol if he stays on Pravachol after trial off then back on will recheck liver function in 1 month after being on Pravachol.

## 2016-10-27 ENCOUNTER — Other Ambulatory Visit: Payer: Commercial Managed Care - PPO

## 2016-10-27 DIAGNOSIS — R748 Abnormal levels of other serum enzymes: Secondary | ICD-10-CM

## 2016-10-28 LAB — COMPREHENSIVE METABOLIC PANEL
ALT: 71 IU/L — ABNORMAL HIGH (ref 0–44)
AST: 51 IU/L — ABNORMAL HIGH (ref 0–40)
Albumin/Globulin Ratio: 2 (ref 1.2–2.2)
Albumin: 4.6 g/dL (ref 3.6–4.8)
Alkaline Phosphatase: 95 IU/L (ref 39–117)
BUN/Creatinine Ratio: 16 (ref 10–24)
BUN: 14 mg/dL (ref 8–27)
Bilirubin Total: 0.5 mg/dL (ref 0.0–1.2)
CO2: 24 mmol/L (ref 18–29)
Calcium: 9.5 mg/dL (ref 8.6–10.2)
Chloride: 106 mmol/L (ref 96–106)
Creatinine, Ser: 0.89 mg/dL (ref 0.76–1.27)
GFR calc Af Amer: 106 mL/min/{1.73_m2} (ref >59)
GFR calc non Af Amer: 92 mL/min/{1.73_m2} (ref >59)
Globulin, Total: 2.3 g/dL (ref 1.5–4.5)
Glucose: 97 mg/dL (ref 65–99)
Potassium: 4.3 mmol/L (ref 3.5–5.2)
Sodium: 142 mmol/L (ref 134–144)
Total Protein: 6.9 g/dL (ref 6.0–8.5)

## 2016-11-03 ENCOUNTER — Encounter: Payer: Self-pay | Admitting: Family Medicine

## 2016-11-03 NOTE — Telephone Encounter (Signed)
Please advise.   CMP     Component Value Date/Time   NA 142 10/27/2016 0802   NA 139 09/01/2012 0717   K 4.3 10/27/2016 0802   K 3.9 09/01/2012 0717   CL 106 10/27/2016 0802   CL 106 09/01/2012 0717   CO2 24 10/27/2016 0802   CO2 27 09/01/2012 0717   GLUCOSE 97 10/27/2016 0802   GLUCOSE 117 (H) 12/17/2015 1952   GLUCOSE 108 (H) 09/01/2012 0717   BUN 14 10/27/2016 0802   BUN 17 09/01/2012 0717   CREATININE 0.89 10/27/2016 0802   CREATININE 1.19 09/01/2012 0717   CALCIUM 9.5 10/27/2016 0802   CALCIUM 8.7 09/01/2012 0717   PROT 6.9 10/27/2016 0802   PROT 7.6 08/31/2012 1422   ALBUMIN 4.6 10/27/2016 0802   ALBUMIN 3.9 08/31/2012 1422   AST 51 (H) 10/27/2016 0802   AST 33 01/26/2016 0841   AST 29 08/31/2012 1422   ALT 71 (H) 10/27/2016 0802   ALT 42 01/26/2016 0841   ALT 48 08/31/2012 1422   ALKPHOS 95 10/27/2016 0802   ALKPHOS 103 08/31/2012 1422   BILITOT 0.5 10/27/2016 0802   BILITOT 0.8 08/31/2012 1422   GFRNONAA 92 10/27/2016 0802   GFRNONAA >60 09/01/2012 0717   GFRAA 106 10/27/2016 0802   GFRAA >60 09/01/2012 1610

## 2016-11-07 ENCOUNTER — Encounter: Payer: Self-pay | Admitting: Family Medicine

## 2017-03-28 ENCOUNTER — Encounter: Payer: Self-pay | Admitting: Family Medicine

## 2017-03-28 ENCOUNTER — Ambulatory Visit (INDEPENDENT_AMBULATORY_CARE_PROVIDER_SITE_OTHER): Payer: Commercial Managed Care - PPO | Admitting: Family Medicine

## 2017-03-28 VITALS — BP 138/95 | HR 78 | Wt 276.0 lb

## 2017-03-28 DIAGNOSIS — I10 Essential (primary) hypertension: Secondary | ICD-10-CM | POA: Diagnosis not present

## 2017-03-28 DIAGNOSIS — E785 Hyperlipidemia, unspecified: Secondary | ICD-10-CM | POA: Diagnosis not present

## 2017-03-28 LAB — LP+ALT+AST PICCOLO, WAIVED
ALT (SGPT) Piccolo, Waived: 81 U/L — ABNORMAL HIGH (ref 10–47)
AST (SGOT) Piccolo, Waived: 59 U/L — ABNORMAL HIGH (ref 11–38)
Chol/HDL Ratio Piccolo,Waive: 3.7 mg/dL
Cholesterol Piccolo, Waived: 154 mg/dL (ref <200)
HDL Chol Piccolo, Waived: 42 mg/dL — ABNORMAL LOW (ref >59)
LDL Chol Calc Piccolo Waived: 93 mg/dL (ref <100)
Triglycerides Piccolo,Waived: 98 mg/dL (ref <150)
VLDL Chol Calc Piccolo,Waive: 20 mg/dL (ref <30)

## 2017-03-28 MED ORDER — CETIRIZINE HCL 10 MG PO TABS: 10.0000 mg | ORAL_TABLET | Freq: Every day | ORAL | 4 refills | Status: DC

## 2017-03-28 MED ORDER — BENAZEPRIL HCL 40 MG PO TABS: 40.0000 mg | ORAL_TABLET | Freq: Every day | ORAL | 3 refills | Status: DC

## 2017-03-28 NOTE — Assessment & Plan Note (Addendum)
Reviewed with normal EKG will start Benzapril 40 mg patient education given on medication and use will recheck in 1 month with BMP. We'll observe headaches to see if improved with treating blood pressure

## 2017-03-28 NOTE — Progress Notes (Signed)
BP (!) 138/95   Pulse 78   Wt 276 lb (125.2 kg)   SpO2 97%   BMI 34.68 kg/m    Subjective:    Patient ID: Thomas Mercado, male    DOB: 05-24-1956, 61 y.o.   MRN: [id number]  HPI: Thomas Mercado is a 61 y.o. male  Chief Complaint  Patient presents with  . Hypertension  . Hyperlipidemia  . Headache  . Jaw Pain    x several months.   Patient with multiple concerns First concern is some headache with all pain has been working with the dentist and is helped a little bit but still having headache. More bitemporal may be associated with stress or noted his blood pressures elevated.  On chart review patient's had elevated blood pressure readings in the past and diagnosis of hypertension in the past.  Patient taking other medications without problems needing refill on Zyrtec.  Reviewed elevated liver enzymes and taking pravastatin patient's liver enzymes still elevated today.  Relevant past medical, surgical, family and social history reviewed and updated as indicated. Interim medical history since our last visit reviewed. Allergies and medications reviewed and updated.  Review of Systems  Constitutional: Negative.   Respiratory: Negative.   Cardiovascular: Negative.     Per HPI unless specifically indicated above     Objective:    BP (!) 138/95   Pulse 78   Wt 276 lb (125.2 kg)   SpO2 97%   BMI 34.68 kg/m   Wt Readings from Last 3 Encounters:  03/28/17 276 lb (125.2 kg)  09/21/16 274 lb (124.3 kg)  08/04/16 271 lb (122.9 kg)    Physical Exam  Constitutional: He is oriented to person, place, and time. He appears well-developed and well-nourished.  HENT:  Head: Normocephalic and atraumatic.  Eyes: Conjunctivae and EOM are normal.  Neck: Normal range of motion.  Cardiovascular: Normal rate, regular rhythm and normal heart sounds.   Pulmonary/Chest: Effort normal and breath sounds normal.  Musculoskeletal: Normal range of motion.  Neurological: He is alert and  oriented to person, place, and time.  Skin: No erythema.  Psychiatric: He has a normal mood and affect. His behavior is normal. Judgment and thought content normal.  eKG normal sinus rhythm with no acute change  Results for orders placed or performed in visit on 03/28/17  LP+ALT+AST Piccolo, [employer]  Result Value Ref Range   ALT (SGPT) Piccolo, Waived 81 (H) 10 - 47 U/L   AST (SGOT) Piccolo, Waived 59 (H) 11 - 38 U/L   Cholesterol Piccolo, Waived 154 <200 mg/dL   HDL Chol Piccolo, Waived 42 (L) >59 mg/dL   Triglycerides Piccolo,Waived 98 <150 mg/dL   Chol/HDL Ratio Piccolo,Waive 3.7 mg/dL   LDL Chol Calc Piccolo Waived 93 <100 mg/dL   VLDL Chol Calc Piccolo,Waive 20 <30 mg/dL      Assessment & Plan:   Problem List Items Addressed This Visit      Cardiovascular and Mediastinum   Essential hypertension    Reviewed with normal EKG will start Benzapril 40 mg patient education given on medication and use will recheck in 1 month with BMP. We'll observe headaches to see if improved with treating blood pressure      Relevant Medications   benazepril (LOTENSIN) 40 MG tablet   Other Relevant Orders   EKG 12-Lead (Completed)     Other   HLD (hyperlipidemia) - Primary    High cholesterol with elevated liver enzymes will stop Pravachol and observe recheck lipid  panel and ALT, AST 1 month.      Relevant Medications   benazepril (LOTENSIN) 40 MG tablet   Other Relevant Orders   Basic metabolic panel   LP+ALT+AST Piccolo, Waived (Completed)       Follow up plan: Return in about 4 weeks (around 04/25/2017) for BMP,  Lipids, ALT, AST.

## 2017-03-28 NOTE — Assessment & Plan Note (Signed)
High cholesterol with elevated liver enzymes will stop Pravachol and observe recheck lipid panel and ALT, AST 1 month.

## 2017-03-29 ENCOUNTER — Encounter: Payer: Self-pay | Admitting: Family Medicine

## 2017-03-29 LAB — BASIC METABOLIC PANEL
BUN/Creatinine Ratio: 14 (ref 10–24)
BUN: 13 mg/dL (ref 8–27)
CO2: 22 mmol/L (ref 20–29)
Calcium: 9.4 mg/dL (ref 8.6–10.2)
Chloride: 104 mmol/L (ref 96–106)
Creatinine, Ser: 0.94 mg/dL (ref 0.76–1.27)
GFR calc Af Amer: 100 mL/min/{1.73_m2} (ref >59)
GFR calc non Af Amer: 87 mL/min/{1.73_m2} (ref >59)
Glucose: 93 mg/dL (ref 65–99)
Potassium: 4.3 mmol/L (ref 3.5–5.2)
Sodium: 139 mmol/L (ref 134–144)

## 2017-04-21 ENCOUNTER — Telehealth: Payer: Self-pay

## 2017-04-21 NOTE — Telephone Encounter (Signed)
Patient called for recent X ray results. Please advise.

## 2017-04-23 NOTE — Telephone Encounter (Signed)
Call pt Is this the correct patient? Did someone else order the x-rays? I don't see anything for x-rays for this patient.Marland Kitchen

## 2017-04-24 NOTE — Telephone Encounter (Signed)
Pt has f/u on Monday, will discuss then

## 2017-04-24 NOTE — Telephone Encounter (Signed)
Left message on machine for pt to return call to the office.  

## 2017-05-01 ENCOUNTER — Encounter: Payer: Self-pay | Admitting: Family Medicine

## 2017-05-01 ENCOUNTER — Ambulatory Visit: Payer: Commercial Managed Care - PPO | Admitting: Family Medicine

## 2017-05-01 VITALS — BP 107/72 | HR 73 | Temp 98.9°F | Ht 76.0 in | Wt 278.2 lb

## 2017-05-01 DIAGNOSIS — I10 Essential (primary) hypertension: Secondary | ICD-10-CM

## 2017-05-01 DIAGNOSIS — E785 Hyperlipidemia, unspecified: Secondary | ICD-10-CM

## 2017-05-01 DIAGNOSIS — K760 Fatty (change of) liver, not elsewhere classified: Secondary | ICD-10-CM

## 2017-05-01 LAB — LP+ALT+AST PICCOLO, WAIVED
ALT (SGPT) Piccolo, Waived: 78 U/L — ABNORMAL HIGH (ref 10–47)
AST (SGOT) Piccolo, Waived: 50 U/L — ABNORMAL HIGH (ref 11–38)
Chol/HDL Ratio Piccolo,Waive: 4.7 mg/dL
Cholesterol Piccolo, Waived: 194 mg/dL (ref <200)
HDL Chol Piccolo, Waived: 42 mg/dL — ABNORMAL LOW (ref >59)
LDL Chol Calc Piccolo Waived: 128 mg/dL — ABNORMAL HIGH (ref <100)
Triglycerides Piccolo,Waived: 123 mg/dL (ref <150)
VLDL Chol Calc Piccolo,Waive: 25 mg/dL (ref <30)

## 2017-05-01 MED ORDER — BENAZEPRIL HCL 40 MG PO TABS: 40.0000 mg | ORAL_TABLET | Freq: Every day | ORAL | 3 refills | Status: DC

## 2017-05-01 NOTE — Progress Notes (Signed)
BP 107/72 (BP Location: Left Arm, Patient Position: Sitting, Cuff Size: Large)   Pulse 73   Temp 98.9 F (37.2 C) (Temporal)   Ht 6\' 4"  (1.93 m)   Wt 278 lb 3.2 oz (126.2 kg)   SpO2 97%   BMI 33.86 kg/m    Subjective:    Patient ID: Thomas Mercado, male    DOB: May 28, 1956, 61 y.o.   MRN: [id number]  HPI: Thomas Mercado is a 61 y.o. male  Chief Complaint  Patient presents with  . Follow-up  . Hyperlipidemia  . Hypertension  patient follow-up elevated liver enzymes and some myalgias from pravastatin. Stopping pravastatin helped his myalgias. Liver enzymes have remained elevated On review patient's had this problem before and diagnosis of  Fatty liver disease. Patient does not drink any significant alcohol. Doesn't take Tylenol or other liver toxic agents.  Blood pressure doing ell no compls from Benzapril.  Relevant past medical, surgical, family and social history reviewed and updated as indicated. Interim medical history since our last visit reviewed. Allergies and medications reviewed and updated.  Review of Systems  Constitutional: Negative.   Respiratory: Negative.   Cardiovascular: Negative.     Per HPI unless specifically indicated above     Objective:    BP 107/72 (BP Location: Left Arm, Patient Position: Sitting, Cuff Size: Large)   Pulse 73   Temp 98.9 F (37.2 C) (Temporal)   Ht 6\' 4"  (1.93 m)   Wt 278 lb 3.2 oz (126.2 kg)   SpO2 97%   BMI 33.86 kg/m   Wt Readings from Last 3 Encounters:  05/01/17 278 lb 3.2 oz (126.2 kg)  03/28/17 276 lb (125.2 kg)  09/21/16 274 lb (124.3 kg)    Physical Exam  Constitutional: He is oriented to person, place, and time. He appears well-developed and well-nourished.  HENT:  Head: Normocephalic and atraumatic.  Eyes: Conjunctivae and EOM are normal.  Neck: Normal range of motion.  Cardiovascular: Normal rate, regular rhythm and normal heart sounds.  Pulmonary/Chest: Effort normal and breath sounds normal.    Musculoskeletal: Normal range of motion.  Neurological: He is alert and oriented to person, place, and time.  Skin: No erythema.  Psychiatric: He has a normal mood and affect. His behavior is normal. Judgment and thought content normal.    Results for orders placed or performed in visit on 98/92/11  Basic metabolic panel  Result Value Ref Range   Glucose 93 65 - 99 mg/dL   BUN 13 8 - 27 mg/dL   Creatinine, Ser 0.94 0.76 - 1.27 mg/dL   GFR calc non Af Amer 87 >59 mL/min/1.73   GFR calc Af Amer 100 >59 mL/min/1.73   BUN/Creatinine Ratio 14 10 - 24   Sodium 139 134 - 144 mmol/L   Potassium 4.3 3.5 - 5.2 mmol/L   Chloride 104 96 - 106 mmol/L   CO2 22 20 - 29 mmol/L   Calcium 9.4 8.6 - 10.2 mg/dL  LP+ALT+AST Piccolo, Waived  Result Value Ref Range   ALT (SGPT) Piccolo, Waived 81 (H) 10 - 47 U/L   AST (SGOT) Piccolo, Waived 59 (H) 11 - 38 U/L   Cholesterol Piccolo, Waived 154 <200 mg/dL   HDL Chol Piccolo, Waived 42 (L) >59 mg/dL   Triglycerides Piccolo,Waived 98 <150 mg/dL   Chol/HDL Ratio Piccolo,Waive 3.7 mg/dL   LDL Chol Calc Piccolo Waived 93 <100 mg/dL   VLDL Chol Calc Piccolo,Waive 20 <30 mg/dL      Assessment &  Plan:   Problem List Items Addressed This Visit      Cardiovascular and Mediastinum   Essential hypertension - Primary    The current medical regimen is effective;  continue present plan and medications.       Relevant Medications   benazepril (LOTENSIN) 40 MG tablet   Other Relevant Orders   Basic metabolic panel     Digestive   Non-alcoholic fatty liver disease    Because of ongoing issues with elevated liver enzymes will refer to GI to further evaluate and make recommendations. Discuss weight loss and importance.      Relevant Orders   Ambulatory referral to Gastroenterology     Other   HLD (hyperlipidemia)   Relevant Medications   benazepril (LOTENSIN) 40 MG tablet   Other Relevant Orders   LP+ALT+AST Piccolo, Waived       Follow up  plan: Return in about 6 months (around 10/29/2017) for Physical Exam.

## 2017-05-01 NOTE — Assessment & Plan Note (Signed)
Because of ongoing issues with elevated liver enzymes will refer to GI to further evaluate and make recommendations. Discuss weight loss and importance.

## 2017-05-01 NOTE — Assessment & Plan Note (Signed)
The current medical regimen is effective;  continue present plan and medications.  

## 2017-05-02 ENCOUNTER — Encounter: Payer: Self-pay | Admitting: Family Medicine

## 2017-05-02 LAB — BASIC METABOLIC PANEL
BUN/Creatinine Ratio: 17 (ref 10–24)
BUN: 17 mg/dL (ref 8–27)
CO2: 23 mmol/L (ref 20–29)
Calcium: 9.4 mg/dL (ref 8.6–10.2)
Chloride: 104 mmol/L (ref 96–106)
Creatinine, Ser: 1.03 mg/dL (ref 0.76–1.27)
GFR calc Af Amer: 90 mL/min/{1.73_m2} (ref >59)
GFR calc non Af Amer: 77 mL/min/{1.73_m2} (ref >59)
Glucose: 81 mg/dL (ref 65–99)
Potassium: 4.9 mmol/L (ref 3.5–5.2)
Sodium: 141 mmol/L (ref 134–144)

## 2017-05-29 ENCOUNTER — Ambulatory Visit: Payer: Commercial Managed Care - PPO | Admitting: Podiatry

## 2017-06-05 ENCOUNTER — Ambulatory Visit: Payer: Commercial Managed Care - PPO | Admitting: Gastroenterology

## 2017-06-05 ENCOUNTER — Encounter: Payer: Self-pay | Admitting: Gastroenterology

## 2017-06-05 ENCOUNTER — Ambulatory Visit: Payer: Commercial Managed Care - PPO | Admitting: Podiatry

## 2017-06-05 ENCOUNTER — Encounter: Payer: Self-pay | Admitting: Podiatry

## 2017-06-05 VITALS — BP 125/78 | HR 86 | Resp 16

## 2017-06-05 VITALS — BP 140/81 | HR 88 | Ht 76.0 in | Wt 276.0 lb

## 2017-06-05 DIAGNOSIS — S8390XA Sprain of unspecified site of unspecified knee, initial encounter: Secondary | ICD-10-CM | POA: Insufficient documentation

## 2017-06-05 DIAGNOSIS — R748 Abnormal levels of other serum enzymes: Secondary | ICD-10-CM

## 2017-06-05 DIAGNOSIS — L603 Nail dystrophy: Secondary | ICD-10-CM | POA: Diagnosis not present

## 2017-06-05 NOTE — Progress Notes (Signed)
Gastroenterology Consultation  Referring Provider:     Guadalupe Maple, MD Primary Care Physician:  Guadalupe Maple, MD Primary Gastroenterologist:  Dr. Allen Norris     Reason for Consultation:     Fatty liver        HPI:   Lemont Sitzmann is a 61 y.o. y/o male referred for consultation & management of fatty liver by Dr. Jeananne Rama, Jeannette How, MD.  This patient comes in today with a history of fatty liver and abnormal liver enzymes. The patient states that he will have his liver enzymes checked and they will be elevated then he will be brought back the following month and a liver enzymes will come back to normal. The patient denies any excessive alcohol use or abuse. The patient also denies any other symptoms associated with his abnormal liver enzymes. The patient did have a ultrasound back in March of this year that showed his fatty liver to have decreased compared to a ultrasound in 2012. The patient does report that his weight goes up and down and his weight today is 276 pounds.  Past Medical History:  Diagnosis Date  . Allergy   . Atrial fibrillation [facility]  . Dysrhythmia   . ED (erectile dysfunction)   . GERD (gastroesophageal reflux disease)   . Heart murmur   . Hyperlipidemia   . Hypertension   . Sleep apnea     Past Surgical History:  Procedure Laterality Date  . ABLATION  Sept 2014   heart  . ABLATION  12/2015   heart  . COLONOSCOPY WITH PROPOFOL N/A 10/23/2015   Procedure: COLONOSCOPY WITH PROPOFOL;  Surgeon: Lollie Sails, MD;  [facility] END[facility]COPY;  Service: Endoscopy;  Laterality: N/A;  . KNEE ARTHROSCOPY Left 04/09/2015   Procedure: ,left knee arthroscopy, limited synovectomy and partial medial menisectomy.;  Surgeon: Thornton Park, MD;  Location: [facility] ORS;  Service: Orthopedics;  Laterality: Left;  Marland Kitchen VARICOCELE EXCISION      Prior to Admission medications   Medication Sig Start Date End Date Taking? Authorizing Provider    aspirin EC 81 MG tablet Take by mouth.   Yes [provider]  benazepril (LOTENSIN) 40 MG tablet Take 1 tablet (40 mg total) daily by mouth. 05/01/17  Yes Crissman, Jeannette How, MD  cetirizine (ZYRTEC) 10 MG tablet Take 1 tablet (10 mg total) by mouth daily. 03/28/17  Yes Crissman, Jeannette How, MD  esomeprazole (NEXIUM) 40 MG capsule Take 1 capsule (40 mg total) by mouth daily at 12 noon. 09/21/16  Yes Crissman, Jeannette How, MD  fexofenadine (ALLEGRA) 180 MG tablet Take 1 tablet (180 mg total) by mouth daily. 09/21/16  Yes Crissman, Jeannette How, MD  fluticasone (FLONASE) 50 MCG/ACT nasal spray Place 2 sprays into both nostrils daily. 09/21/16  Yes Guadalupe Maple, MD    Family History  Problem Relation Age of Onset  . Cancer Mother        breast  . Alzheimer's disease Father   . Hypertension Father   . Arthritis Father   . Heart disease Brother      Social History   Tobacco Use  . Smoking status: Never Smoker  . Smokeless tobacco: Never Used  Substance Use Topics  . Alcohol use: Yes    Alcohol/week: 0.0 - 2.4 oz    Comment: on occasion  . Drug use: No    Allergies as of 06/05/2017 - Review Complete 06/05/2017  Allergen Reaction Noted  .  Penicillin g benzathine Rash 03/10/2015    Review of Systems:    All systems reviewed and negative except where noted in HPI.   Physical Exam:  BP 140/81   Pulse 88   Ht 6\' 4"  (1.93 m)   Wt 276 lb (125.2 kg)   BMI 33.60 kg/m  No LMP for male patient. Psych:  Alert and cooperative. Normal mood and affect. General:   Alert,  Well-developed, well-nourished, pleasant and cooperative in NAD Head:  Normocephalic and atraumatic. Eyes:  Sclera clear, no icterus.   Conjunctiva pink. Ears:  Normal auditory acuity. Nose:  No deformity, discharge, or lesions. Mouth:  No deformity or lesions,oropharynx pink & moist. Neck:  Supple; no masses or thyromegaly. Lungs:  Respirations even and unlabored.  Clear throughout to auscultation.   No wheezes, crackles, or  rhonchi. No acute distress. Heart:  Regular rate and rhythm; no murmurs, clicks, rubs, or gallops. Abdomen:  Normal bowel sounds.  No bruits.  Soft, non-tender and non-distended without masses, hepatosplenomegaly or hernias noted.  No guarding or rebound tenderness.  Negative Carnett sign.   Rectal:  Deferred.  Msk:  Symmetrical without gross deformities.  Good, equal movement & strength bilaterally. Pulses:  Normal pulses noted. Extremities:  No clubbing or edema.  No cyanosis. Neurologic:  Alert and oriented x3;  grossly normal neurologically. Skin:  Intact without significant lesions or rashes.  No jaundice. Lymph Nodes:  No significant cervical adenopathy. Psych:  Alert and cooperative. Normal mood and affect.  Imaging Studies: No results found.  Assessment and Plan:   Jamaury Gumz is a 60 y.o. y/o male who comes here with fatty liver and elevated liver enzymes. The patient has been told to try and lose weight. He has been given information on a Mediterranean diet compared to a high fat diet. The patient will also have his labs sent off today for other possible causes of abnormal liver enzymes. He will be notified with the results. If there is any concern from any of his blood work he may need to undergo a liver biopsy in the future. The patient has been explained the plan and agrees with it.  Lucilla Lame, MD. Marval Regal   Note: This dictation was prepared with Dragon dictation along with smaller phrase technology. Any transcriptional errors that result from this process are unintentional.

## 2017-06-05 NOTE — Progress Notes (Signed)
Subjective:  Patient ID: Thomas Mercado, male    DOB: 11-26-1955,  MRN: [id number] HPI Chief Complaint  Patient presents with  . Nail Problem    2nd toenail right - thick, dark nail x 1 year, unable to trim and getting sore, notices 3rd nail right is darkening some now    61 y.o. male presents with the above complaint.     Past Medical History:  Diagnosis Date  . Allergy   . Atrial fibrillation [facility]  . Dysrhythmia   . ED (erectile dysfunction)   . GERD (gastroesophageal reflux disease)   . Heart murmur   . Hyperlipidemia   . Hypertension   . Sleep apnea    Past Surgical History:  Procedure Laterality Date  . ABLATION  Sept 2014   heart  . ABLATION  12/2015   heart  . COLONOSCOPY WITH PROPOFOL N/A 10/23/2015   Procedure: COLONOSCOPY WITH PROPOFOL;  Surgeon: Lollie Sails, MD;  Location: [facility] ENDOSCOPY;  Service: Endoscopy;  Laterality: N/A;  . KNEE ARTHROSCOPY Left 04/09/2015   Procedure: ,left knee arthroscopy, limited synovectomy and partial medial menisectomy.;  Surgeon: Thornton Park, MD;  Location: [facility] ORS;  Service: Orthopedics;  Laterality: Left;  Marland Kitchen VARICOCELE EXCISION      Current Outpatient Medications:  .  aspirin EC 81 MG tablet, Take by mouth., Disp: , Rfl:  .  benazepril (LOTENSIN) 40 MG tablet, Take 1 tablet (40 mg total) daily by mouth., Disp: 90 tablet, Rfl: 3 .  cetirizine (ZYRTEC) 10 MG tablet, Take 1 tablet (10 mg total) by mouth daily., Disp: 90 tablet, Rfl: 4 .  esomeprazole (NEXIUM) 40 MG capsule, Take 1 capsule (40 mg total) by mouth daily at 12 noon., Disp: 90 capsule, Rfl: 4 .  fexofenadine (ALLEGRA) 180 MG tablet, Take 1 tablet (180 mg total) by mouth daily., Disp: 90 tablet, Rfl: 4 .  fluticasone (FLONASE) 50 MCG/ACT nasal spray, Place 2 sprays into both nostrils daily., Disp: 48 g, Rfl: 4  Allergies  Allergen Reactions  . Penicillin G Benzathine Rash    As a child   Review of Systems  HENT:  Positive for sinus pain.   All other systems reviewed and are negative.  Objective:   Vitals:   06/05/17 1116  BP: 125/78  Pulse: 86  Resp: 16    General: Well developed, nourished, in no acute distress, alert and oriented x3   Dermatological: Skin is warm, dry and supple bilateral. Nails x 10 are well maintained; remaining integument appears unremarkable at this time. There are no open sores, no preulcerative lesions, no rash or signs of infection present.  Seconds nail of the right foot demonstrates thick dystrophic discolored darkened nail.  Multiple lines across the nail from starting and stopping growth.  No signs of bacterial infection around the nail there is distal subungual onycholysis  Vascular: Dorsalis Pedis artery and Posterior Tibial artery pedal pulses are 2/4 bilateral with immedate capillary fill time. Pedal hair growth present. No varicosities and no lower extremity edema present bilateral.   Neruologic: Grossly intact via light touch bilateral. Vibratory intact via tuning fork bilateral. Protective threshold with Semmes Wienstein monofilament intact to all pedal sites bilateral. Patellar and Achilles deep tendon reflexes 2+ bilateral. No Babinski or clonus noted bilateral.   Musculoskeletal: No gross boney pedal deformities bilateral. No pain, crepitus, or limitation noted with foot and ankle range of motion bilateral. Muscular strength 5/5 in all groups tested bilateral.  Gait: Unassisted, Nonantalgic.    Radiographs:  None taken  Assessment & Plan:   Assessment: Nail dystrophy second digit right foot cannot rule out onychomycosis.  Plan: Took a sample of skin the nail to be sent for pathologic evaluation will follow up with him in 1 month.     Max T. [place], [place]

## 2017-06-06 LAB — HEPATIC FUNCTION PANEL
ALT: 97 IU/L — ABNORMAL HIGH (ref 0–44)
AST: 52 IU/L — ABNORMAL HIGH (ref 0–40)
Albumin: 4.7 g/dL (ref 3.6–4.8)
Alkaline Phosphatase: 107 IU/L (ref 39–117)
Bilirubin Total: 0.4 mg/dL (ref 0.0–1.2)
Bilirubin, Direct: 0.13 mg/dL (ref 0.00–0.40)
Total Protein: 7 g/dL (ref 6.0–8.5)

## 2017-06-06 LAB — ANTI-SMOOTH MUSCLE ANTIBODY, IGG: Smooth Muscle Ab: 6 Units (ref 0–19)

## 2017-06-06 LAB — CBC WITH DIFFERENTIAL/PLATELET
Basophils Absolute: 0 10*3/uL (ref 0.0–0.2)
Basos: 1 %
EOS (ABSOLUTE): 0.2 10*3/uL (ref 0.0–0.4)
Eos: 4 %
Hematocrit: 42.3 % (ref 37.5–51.0)
Hemoglobin: 14.2 g/dL (ref 13.0–17.7)
Immature Grans (Abs): 0 10*3/uL (ref 0.0–0.1)
Immature Granulocytes: 0 %
Lymphocytes Absolute: 1.7 10*3/uL (ref 0.7–3.1)
Lymphs: 38 %
MCH: 29.7 pg (ref 26.6–33.0)
MCHC: 33.6 g/dL (ref 31.5–35.7)
MCV: 89 fL (ref 79–97)
Monocytes Absolute: 0.3 10*3/uL (ref 0.1–0.9)
Monocytes: 7 %
Neutrophils Absolute: 2.4 10*3/uL (ref 1.4–7.0)
Neutrophils: 50 %
Platelets: 200 10*3/uL (ref 150–379)
RBC: 4.78 x10E6/uL (ref 4.14–5.80)
RDW: 13.8 % (ref 12.3–15.4)
WBC: 4.6 10*3/uL (ref 3.4–10.8)

## 2017-06-06 LAB — IRON AND TIBC
Iron Saturation: 30 % (ref 15–55)
Iron: 102 ug/dL (ref 38–169)
Total Iron Binding Capacity: 339 ug/dL (ref 250–450)
UIBC: 237 ug/dL (ref 111–343)

## 2017-06-06 LAB — MITOCHONDRIAL ANTIBODIES: Mitochondrial Ab: 10.3 Units (ref 0.0–20.0)

## 2017-06-06 LAB — ALPHA-1-ANTITRYPSIN: A-1 Antitrypsin: 140 mg/dL (ref 90–200)

## 2017-06-06 LAB — HEPATITIS A ANTIBODY, TOTAL: Hep A Total Ab: NEGATIVE

## 2017-06-06 LAB — FERRITIN: Ferritin: 207 ng/mL (ref 30–400)

## 2017-06-06 LAB — CERULOPLASMIN: Ceruloplasmin: 23.5 mg/dL (ref 16.0–31.0)

## 2017-06-06 LAB — HEPATITIS B SURFACE ANTIBODY,QUALITATIVE: Hep B Surface Ab, Qual: NONREACTIVE

## 2017-06-06 LAB — HEPATITIS B SURFACE ANTIGEN: Hepatitis B Surface Ag: NEGATIVE

## 2017-06-06 LAB — HEPATITIS C ANTIBODY: Hep C Virus Ab: <0.1 s/co ratio (ref 0.0–0.9)

## 2017-06-15 ENCOUNTER — Telehealth: Payer: Self-pay

## 2017-06-15 NOTE — Telephone Encounter (Signed)
Pt notified of lab results. Recall added for repeat LFT's in March.

## 2017-06-15 NOTE — Telephone Encounter (Signed)
-----   Message from Lucilla Lame, MD sent at 06/08/2017  5:26 PM EST ----- The patient know that all of his labs were negative except for his liver enzymes which are still elevated.  The patient should try to lose weight and decrease the fat in his liver and have his labs checked in 3 months.  If his liver enzymes are still elevated he should undergo a liver biopsy.

## 2017-07-03 ENCOUNTER — Ambulatory Visit: Payer: Commercial Managed Care - PPO | Admitting: Podiatry

## 2017-07-03 ENCOUNTER — Encounter: Payer: Self-pay | Admitting: Podiatry

## 2017-07-03 DIAGNOSIS — L603 Nail dystrophy: Secondary | ICD-10-CM | POA: Diagnosis not present

## 2017-07-03 NOTE — Progress Notes (Signed)
He presents today for follow-up of his nail pathology results.  Objective: Onychomycosis demonstrated by pathology results.  After reviewing previous labs he does not have a history of hepatitis with an elevated liver enzymes.  At this point oral medication will not be utilized.  Not enough specific information regarding his pathology to recommend topical treatment.  Assessment: Onychomycosis.  Plan: We will get him started with laser therapy.  He will follow-up with Janett Billow at [place] office.

## 2017-07-11 ENCOUNTER — Ambulatory Visit (INDEPENDENT_AMBULATORY_CARE_PROVIDER_SITE_OTHER): Payer: Commercial Managed Care - PPO | Admitting: Podiatry

## 2017-07-11 DIAGNOSIS — B351 Tinea unguium: Secondary | ICD-10-CM

## 2017-07-11 DIAGNOSIS — L603 Nail dystrophy: Secondary | ICD-10-CM

## 2017-07-11 NOTE — Progress Notes (Signed)
Pt presents with mycotic infection of nails 1-3 Rt foot   All other systems are negative  Laser therapy administered to affected nails and tolerated well. All safety precautions were in place. RE-appointed in 4 weeks for 2nd treatment

## 2017-07-29 ENCOUNTER — Ambulatory Visit (INDEPENDENT_AMBULATORY_CARE_PROVIDER_SITE_OTHER): Payer: Commercial Managed Care - PPO

## 2017-07-29 ENCOUNTER — Ambulatory Visit
Admission: EM | Admit: 2017-07-29 | Discharge: 2017-07-29 | Disposition: A | Discharge status: Home / Self Care | Payer: Commercial Managed Care - PPO | Attending: Family Medicine | Admitting: Family Medicine

## 2017-07-29 ENCOUNTER — Other Ambulatory Visit: Payer: Self-pay

## 2017-07-29 DIAGNOSIS — J069 Acute upper respiratory infection, unspecified: Secondary | ICD-10-CM | POA: Diagnosis not present

## 2017-07-29 DIAGNOSIS — R9389 Abnormal findings on diagnostic imaging of other specified body structures: Secondary | ICD-10-CM

## 2017-07-29 DIAGNOSIS — R05 Cough: Secondary | ICD-10-CM | POA: Diagnosis not present

## 2017-07-29 DIAGNOSIS — R062 Wheezing: Secondary | ICD-10-CM

## 2017-07-29 DIAGNOSIS — R918 Other nonspecific abnormal finding of lung field: Secondary | ICD-10-CM | POA: Diagnosis not present

## 2017-07-29 MED ORDER — HYDROCOD POLST-CPM POLST ER 10-8 MG/5ML PO SUER: 5.0000 mL | Freq: Every evening | ORAL | 0 refills | Status: DC | PRN

## 2017-07-29 MED ORDER — PREDNISONE 10 MG PO TABS: ORAL_TABLET | ORAL | 0 refills | Status: DC

## 2017-07-29 MED ORDER — BENZONATATE 100 MG PO CAPS: 100.0000 mg | ORAL_CAPSULE | Freq: Three times a day (TID) | ORAL | 0 refills | Status: DC | PRN

## 2017-07-29 MED ORDER — ALBUTEROL SULFATE HFA 108 (90 BASE) MCG/ACT IN AERS: 2.0000 | INHALATION_SPRAY | RESPIRATORY_TRACT | 0 refills | Status: DC | PRN

## 2017-07-29 MED ORDER — DOXYCYCLINE HYCLATE 100 MG PO CAPS: 100.0000 mg | ORAL_CAPSULE | Freq: Two times a day (BID) | ORAL | 0 refills | Status: DC

## 2017-07-29 MED ORDER — IPRATROPIUM-ALBUTEROL 0.5-2.5 (3) MG/3ML IN SOLN
3.0000 mL | Freq: Once | RESPIRATORY_TRACT | Status: AC
  Administered 2017-07-29: 3 mL via RESPIRATORY_TRACT

## 2017-07-29 NOTE — Discharge Instructions (Signed)
Take medication as prescribed. Rest. Drink plenty of fluids.  ° °Follow up with your primary care physician this week as needed. Return to Urgent care for new or worsening concerns.  ° °

## 2017-07-29 NOTE — ED Triage Notes (Signed)
Cough, congestion and runny nose x one week. No fever, no pain except related to coughing. Has tried Mucinex, Sudafed and Flonase with short-term relief.

## 2017-07-29 NOTE — ED Provider Notes (Addendum)
[facility] ____________________________________________  Time seen: Approximately 2:30 PM  I have reviewed the triage vital signs and the nursing notes.   HISTORY  Chief Complaint Cough  HPI Thomas Mercado is a 62 y.o. male presenting for evaluation of 1 week of runny nose, nasal congestion and cough.  Patient reports at mid week he thought he started to improve, but then worsened again particularly regarding the cough.  States that he can feel some congestion in his chest as well as some intermittent wheezing.  States cough is worse at night with associated postnasal drainage.  Denies accompanying fevers or body aches.  Has been taking over-the-counter Mucinex, Sudafed and Flonase with some brief improvement but no resolution.  States cough is often dry nonproductive, occasionally productive.  Denies hemoptysis.  Denies associated chest pain or shortness of breath.  States he does intermittently feel sore from coughing so hard.  States coughing tends to come in coughing fits.  States cough is disrupting sleep.  Denies known direct sick contacts.  Has continued to eat and drink well.  Has continue to remain active.  Denies chest pain, shortness of breath, abdominal pain, extremity pain, extremity swelling or rash. Denies recent sickness. Denies recent antibiotic use. Denies smoking. Denies history of wheezing, COPD, asthma or respiratory issues.  History of A. fib, resolved post ablation.  No recent cardiac changes.  Denies renal insufficiency.   Guadalupe Maple, MD: PCP   Past Medical History:  Diagnosis Date  . Allergy   . Atrial fibrillation [facility])    [facility]  . Dysrhythmia   . ED (erectile dysfunction)   . GERD (gastroesophageal reflux disease)   . Heart murmur   . Hyperlipidemia   . Hypertension   . Sleep apnea     Patient Active Problem List   Diagnosis Date Noted  . Sprain of knee 06/05/2017  . Essential hypertension 03/28/2017  .  Infection of parotid gland 01/26/2016  . Knee pain, left 04/06/2015  . ED (erectile dysfunction) 03/10/2015  . Allergic rhinitis 03/10/2015  . Hypogonadism in male 03/10/2015  . Non-alcoholic fatty liver disease 03/10/2015  . Obstructive apnea 03/12/2013  . Acid reflux 02/07/2013  . Borderline blood pressure 02/07/2013  . HLD (hyperlipidemia) 02/07/2013    Past Surgical History:  Procedure Laterality Date  . ABLATION  Sept 2014   heart  . ABLATION  12/2015   heart  . COLONOSCOPY WITH PROPOFOL N/A 10/23/2015   Procedure: COLONOSCOPY WITH PROPOFOL;  Surgeon: Lollie Sails, MD;  Location: [facility] ENDOSCOPY;  Service: Endoscopy;  Laterality: N/A;  . KNEE ARTHROSCOPY Left 04/09/2015   Procedure: ,left knee arthroscopy, limited synovectomy and partial medial menisectomy.;  Surgeon: Thornton Park, MD;  Location: [facility] ORS;  Service: Orthopedics;  Laterality: Left;  Marland Kitchen VARICOCELE EXCISION       No current facility-administered medications for this encounter.   Current Outpatient Medications:  .  albuterol (PROVENTIL HFA;VENTOLIN HFA) 108 (90 Base) MCG/ACT inhaler, Inhale 2 puffs into the lungs every 4 (four) hours as needed for wheezing., Disp: 1 Inhaler, Rfl: 0 .  aspirin EC 81 MG tablet, Take by mouth., Disp: , Rfl:  .  benazepril (LOTENSIN) 40 MG tablet, Take 1 tablet (40 mg total) daily by mouth., Disp: 90 tablet, Rfl: 3 .  benzonatate (TESSALON PERLES) 100 MG capsule, Take 1 capsule (100 mg total) by mouth 3 (three) times daily as needed for cough., Disp: 15 capsule, Rfl: 0 .  cetirizine (ZYRTEC) 10 MG  tablet, Take 1 tablet (10 mg total) by mouth daily., Disp: 90 tablet, Rfl: 4 .  chlorpheniramine-HYDROcodone (TUSSIONEX PENNKINETIC ER) 10-8 MG/5ML SUER, Take 5 mLs by mouth at bedtime as needed for cough. do not drive or operate machinery while taking as can cause drowsiness., Disp: 75 mL, Rfl: 0 .  doxycycline (VIBRAMYCIN) 100 MG capsule, Take 1 capsule (100 mg total) by mouth 2 (two)  times daily., Disp: 20 capsule, Rfl: 0 .  esomeprazole (NEXIUM) 40 MG capsule, Take 1 capsule (40 mg total) by mouth daily at 12 noon., Disp: 90 capsule, Rfl: 4 .  fexofenadine (ALLEGRA) 180 MG tablet, Take 1 tablet (180 mg total) by mouth daily., Disp: 90 tablet, Rfl: 4 .  fluticasone (FLONASE) 50 MCG/ACT nasal spray, Place 2 sprays into both nostrils daily., Disp: 48 g, Rfl: 4 .  predniSONE (DELTASONE) 10 MG tablet, Start 60 mg po day one, then 50 mg po day two, taper by 10 mg daily until complete., Disp: 21 tablet, Rfl: 0  Allergies Penicillin g benzathine  Family History  Problem Relation Age of Onset  . Cancer Mother        breast  . Alzheimer's disease Father   . Hypertension Father   . Arthritis Father   . Heart disease Brother     Social History Social History   Tobacco Use  . Smoking status: Never Smoker  . Smokeless tobacco: Never Used  Substance Use Topics  . Alcohol use: Yes    Alcohol/week: 0.0 - 2.4 oz    Comment: on occasion  . Drug use: No    Review of Systems Constitutional: No fever/chills ENT: No sore throat. Cardiovascular: Denies chest pain. Respiratory: Denies shortness of breath. As above.  Gastrointestinal: No abdominal pain.   Musculoskeletal: Negative for back pain. Skin: Negative for rash.   ____________________________________________   PHYSICAL EXAM:  VITAL SIGNS: ED Triage Vitals  Enc Vitals Group     BP 07/29/17 1356 121/81     Pulse Rate 07/29/17 1356 85     Resp 07/29/17 1356 20     Temp 07/29/17 1356 98.4 F (36.9 C)     Temp Source 07/29/17 1356 Oral     SpO2 07/29/17 1356 99 %     Weight 07/29/17 1356 271 lb (122.9 kg)     Height 07/29/17 1356 6\' 3"  (1.905 m)     Head Circumference --      Peak Flow --      Pain Score 07/29/17 1555 3     Pain Loc --      Pain Edu? --      Excl. in [place]? --    Constitutional: Alert and oriented. Well appearing and in no acute distress. Eyes: Conjunctivae are normal. Head:  Atraumatic.Mild to moderate tenderness to palpation bilateral maxillary sinuses. No frontal sinus tenderness. No swelling. No erythema.   Ears: no erythema, normal TMs bilaterally.   Nose: nasal congestion with bilateral nasal turbinate erythema and edema.   Mouth/Throat: Mucous membranes are moist.  Oropharynx non-erythematous.No tonsillar swelling or exudate.  Neck: No stridor.  No cervical spine tenderness to palpation. Hematological/Lymphatic/Immunilogical: No cervical lymphadenopathy. Cardiovascular: Normal rate, regular rhythm. Grossly normal heart sounds.  Good peripheral circulation. Respiratory: Normal respiratory effort.  No retractions. Good air movement. Diffuse inspiratory and expiratory wheezes. Good air movement. Scattered rhonchi.  Dry intermittent cough noted. Musculoskeletal: No lower extremity edema.  Steady gait.  Neurologic:  Normal speech and language. No gross focal neurologic deficits are appreciated.  No gait instability. Skin:  Skin is warm, dry and intact. No rash noted. Psychiatric: Mood and affect are normal. Speech and behavior are normal.  ___________________________________________   LABS (all labs ordered are listed, but only abnormal results are displayed)  Labs Reviewed - No data to display  RADIOLOGY  Dg Chest 2 View  Result Date: 07/29/2017 CLINICAL DATA:  Cough and congestion.  Wheezing. EXAM: CHEST  2 VIEW COMPARISON:  August 31, 2012 FINDINGS: On the lateral view, there is a 1 x 1 cm nodular opacity overlying the heart. A nodular opacity is not confirmed on frontal view. There is no edema or consolidation. The heart size and pulmonary vascularity are normal. No adenopathy. No evident bone lesions. There is aortic atherosclerosis. IMPRESSION: 1 x 1 cm nodular opacity overlying the heart on the lateral view. This finding warrants noncontrast enhanced chest CT to further assess. No edema or consolidation.  There is aortic atherosclerosis. Aortic  Atherosclerosis (ICD10-I70.0). Electronically Signed   By: Lowella Grip III M.D.   On: 07/29/2017 15:13   ____________________________________________   PROCEDURES Procedures   INITIAL IMPRESSION / ASSESSMENT AND PLAN / ED COURSE  Pertinent labs & imaging results that were available during my care of the patient were reviewed by me and considered in my medical decision making (see chart for details).  Well-appearing patient.  No acute distress.  Suspect recent viral upper respiratory infection, concern for secondary sinusitis.  Patient with wheezes noted, but continues with good air movement.  99% O2 sat on room air.  Appears well.  DuoNeb given.  Chest x-ray evaluated with above radiologist report.  Called and discussed chest x-ray with radiologist myself.  Per radiologist 1 x 1 cm nodular opacity overlying the heart on the lateral view recommend CT chest, no edema or consolidation.  Discussed these results in detail with patient and recommend further PCP outpatient follow-up, copy given, patient reports he will call primary on Monday.  After DuoNeb, patient reports feeling much better, wheezes almost fully resolved and continues with good air movement.  Will treat patient with prednisone taper, albuterol inhaler, PRN Tessalon Perles and as needed Tussionex as well as oral doxycycline.  Encourage rest, fluids, supportive care and discussed strict follow-up with primary.   Discussed follow up and return parameters including no resolution or any worsening concerns. Patient verbalized understanding and agreed to plan.   ____________________________________________   FINAL CLINICAL IMPRESSION(S) / ED DIAGNOSES  Final diagnoses:  Upper respiratory tract infection, unspecified type  Wheezing  Abnormal chest x-ray     ED Discharge Orders        Ordered    predniSONE (DELTASONE) 10 MG tablet     07/29/17 1551    albuterol (PROVENTIL HFA;VENTOLIN HFA) 108 (90 Base) MCG/ACT inhaler   Every 4 hours PRN     07/29/17 1551    doxycycline (VIBRAMYCIN) 100 MG capsule  2 times daily     07/29/17 1551    benzonatate (TESSALON PERLES) 100 MG capsule  3 times daily PRN     07/29/17 1551    chlorpheniramine-HYDROcodone (TUSSIONEX PENNKINETIC ER) 10-8 MG/5ML SUER  At bedtime PRN     07/29/17 1551       Note: This dictation was prepared with Dragon dictation along with smaller phrase technology. Any transcriptional errors that result from this process are unintentional.           Marylene Land, NP 07/29/17 [phone]

## 2017-07-31 ENCOUNTER — Encounter: Payer: Self-pay | Admitting: Family Medicine

## 2017-07-31 ENCOUNTER — Telehealth: Payer: Self-pay | Admitting: Family Medicine

## 2017-07-31 DIAGNOSIS — I700 Atherosclerosis of aorta: Secondary | ICD-10-CM | POA: Insufficient documentation

## 2017-07-31 DIAGNOSIS — R911 Solitary pulmonary nodule: Secondary | ICD-10-CM | POA: Insufficient documentation

## 2017-07-31 HISTORY — DX: Atherosclerosis of aorta: I70.0

## 2017-07-31 NOTE — Assessment & Plan Note (Signed)
From chest x-ray we will schedule CT scan Patient is a non-smoker

## 2017-07-31 NOTE — Telephone Encounter (Signed)
Call pt 

## 2017-07-31 NOTE — Telephone Encounter (Signed)
Copied from [place]. Topic: Quick Communication - See Telephone Encounter >> Jul 31, 2017  9:45 AM Burnis Medin, NT wrote: CRM for notification. See Telephone encounter for: Patient called and said he had a X ray done at Urgent Care and wanted the doctor to look at my chart and call him back on what was found.   07/31/17.

## 2017-07-31 NOTE — Telephone Encounter (Signed)
Phone call Discussed with patient pulmonary nodule will schedule CT scan. Patient is a non-smoker.

## 2017-07-31 NOTE — Telephone Encounter (Signed)
Please see request from patient. This was ordered by E.R. At recent visit. Please advise  Per E.R. Note,   "Chest x-ray evaluated with above radiologist report.  Called and discussed chest x-ray with radiologist myself.  Per radiologist 1 x 1 cm nodular opacity overlying the heart on the lateral view recommend CT chest, no edema or consolidation.  Discussed these results in detail with patient and recommend further PCP outpatient follow-up, copy given, patient reports he will call primary on Monday"

## 2017-08-01 ENCOUNTER — Other Ambulatory Visit: Payer: Self-pay | Admitting: Family Medicine

## 2017-08-01 DIAGNOSIS — R911 Solitary pulmonary nodule: Secondary | ICD-10-CM

## 2017-08-04 ENCOUNTER — Telehealth: Payer: Self-pay | Admitting: Family Medicine

## 2017-08-04 NOTE — Telephone Encounter (Signed)
Phone call Discussed with patient CT scan not done yet. Make sure order gets done.

## 2017-08-04 NOTE — Telephone Encounter (Signed)
Copied from [place]. Topic: Quick Communication - See Telephone Encounter >> Jul 31, 2017  9:45 AM Burnis Medin, NT wrote: CRM for notification. See Telephone encounter for: Patient called and said he had a X ray done at Urgent Care and wanted the doctor to look at my chart and call him back on what was found.   07/31/17. >> Aug 03, 2017  1:22 PM Burnis Medin, NT wrote: Patient called back and said the doctor told him if he hadn't heard anything back from the office who is suppose to do his CT scan to call back. Pt would like a call back [phone]

## 2017-08-08 ENCOUNTER — Encounter: Payer: Self-pay | Admitting: Family Medicine

## 2017-08-11 ENCOUNTER — Ambulatory Visit (INDEPENDENT_AMBULATORY_CARE_PROVIDER_SITE_OTHER): Payer: Self-pay

## 2017-08-11 DIAGNOSIS — B351 Tinea unguium: Secondary | ICD-10-CM

## 2017-08-11 DIAGNOSIS — L603 Nail dystrophy: Secondary | ICD-10-CM

## 2017-08-15 ENCOUNTER — Ambulatory Visit
Admission: RE | Admit: 2017-08-15 | Discharge: 2017-08-15 | Disposition: A | Discharge status: Home / Self Care | Payer: Commercial Managed Care - PPO | Source: Ambulatory Visit | Attending: Family Medicine | Admitting: Family Medicine

## 2017-08-15 DIAGNOSIS — R911 Solitary pulmonary nodule: Secondary | ICD-10-CM | POA: Diagnosis present

## 2017-08-15 DIAGNOSIS — I700 Atherosclerosis of aorta: Secondary | ICD-10-CM | POA: Insufficient documentation

## 2017-08-15 DIAGNOSIS — J984 Other disorders of lung: Secondary | ICD-10-CM | POA: Diagnosis not present

## 2017-08-16 ENCOUNTER — Telehealth: Payer: Self-pay | Admitting: Unknown Physician Specialty

## 2017-08-16 ENCOUNTER — Encounter: Payer: Self-pay | Admitting: *Deleted

## 2017-08-16 ENCOUNTER — Other Ambulatory Visit: Payer: Self-pay | Admitting: Unknown Physician Specialty

## 2017-08-16 ENCOUNTER — Encounter: Payer: Self-pay | Admitting: Family Medicine

## 2017-08-16 DIAGNOSIS — R911 Solitary pulmonary nodule: Secondary | ICD-10-CM

## 2017-08-16 NOTE — Telephone Encounter (Signed)
Discussed with pt results of CT scan.  Due to multiple decision making options of nodule f/u, referred to cancer center.  Nurse navigator also alerted

## 2017-08-16 NOTE — Progress Notes (Signed)
  Oncology Nurse Navigator Documentation  Navigator Location: [facility] (08/16/17 1400) Referral date to RadOnc/MedOnc: 08/16/17 (08/16/17 1400) )Navigator Encounter Type: Introductory phone call;Telephone (08/16/17 1400) Telephone: Lahoma Crocker Call;Appt Confirmation/Clarification (08/16/17 1400) Abnormal Finding Date: 07/29/17 (08/16/17 1400)                     Barriers/Navigation Needs: Coordination of Care (08/16/17 1400)   Interventions: Coordination of Care (08/16/17 1400)   Coordination of Care: Appts (08/16/17 1400)        Acuity: Level 2 (08/16/17 1400)   Acuity Level 2: Initial guidance, education and coordination as needed;Educational needs;Assistance expediting appointments (08/16/17 1400)  phone call made to patient to introduce to navigator services and give appt details for new patient appt scheduled on Monday 3/4 in [place] with Dr. Janese Banks. All questions answered during phone call. Pt requested to have an afternoon appt. Reviewed appt with patient that is scheduled on 3/4. Instructed to bring ID and insurance cards to appt. Informed to be expecting new patient packet in the mail that he can complete and bring with him to appt. Contact info given and instructed to call with further questions or needs. Pt verbalized understanding and confirmed appt.    Time Spent with Patient: 30 (08/16/17 1400)

## 2017-08-16 NOTE — Progress Notes (Signed)
Discussed with pt results of CT scan.  Due to multiple decision making options of nodule f/u, referred to cancer center.  Nurse navigator also alerted

## 2017-08-21 ENCOUNTER — Inpatient Hospital Stay: Payer: Commercial Managed Care - PPO | Attending: Oncology | Admitting: Oncology

## 2017-08-21 ENCOUNTER — Encounter: Payer: Self-pay | Admitting: Oncology

## 2017-08-21 ENCOUNTER — Inpatient Hospital Stay: Payer: Commercial Managed Care - PPO

## 2017-08-21 ENCOUNTER — Encounter: Payer: Self-pay | Admitting: *Deleted

## 2017-08-21 VITALS — BP 110/76 | HR 82 | Temp 97.7°F | Resp 18 | Ht 75.0 in | Wt 270.5 lb

## 2017-08-21 DIAGNOSIS — R911 Solitary pulmonary nodule: Secondary | ICD-10-CM | POA: Diagnosis present

## 2017-08-21 NOTE — Progress Notes (Signed)
Pt presents with mycotic infection of nails 1-3 Rt foot   All other systems are negative  Laser therapy administered to affected nails and tolerated well. All safety precautions were in place. RE-appointed in 4 weeks for 3rd treatment

## 2017-08-21 NOTE — Progress Notes (Signed)
Hematology/Oncology Consult note [facility] [phone] [phone]  Patient Care Team: Guadalupe Maple, MD as PCP - General (Family Medicine) Telford Nab, RN as Registered Nurse   Name of the patient: Thomas Mercado  [id number]  [birth year]    Reason for referral- lung nodule   Referring physician- Dr. Jeananne Rama  Date of visit: 08/21/17   History of presenting illness- Patient is a 62 year old male who recently underwent a chest x-ray for symptoms of cough and congestion  Chest x-ray on 03/09/2018 showed 1 x 1 cm overlying the heart on the lateral view for which CT scan was recommended CT chest without contrast on 08/15/2017 showed 9-10 mm nodule in the right middle lobe with smooth slightly lobulated margins.  No evidence of hilar or mediastinal adenopathy.  He is a lifetime non-smoker and drinks occasional alcohol.  His appetite is good and he denies any unintentional weight loss or aches or pains anywhere.  He continues to work and independent of his ADLs and IADLs.  His past medical history significant for hypertension, atrial fibrillation status post ablation and history of abnormal LFTs for which he sees GI   ECOG PS- 0  Pain scale- 0   Review of systems- Review of Systems  Constitutional: Negative for chills, fever, malaise/fatigue and weight loss.  HENT: Negative for congestion, ear discharge and nosebleeds.   Eyes: Negative for blurred vision.  Respiratory: Negative for cough, hemoptysis, sputum production, shortness of breath and wheezing.   Cardiovascular: Negative for chest pain, palpitations, orthopnea and claudication.  Gastrointestinal: Negative for abdominal pain, blood in stool, constipation, diarrhea, heartburn, melena, nausea and vomiting.  Genitourinary: Negative for dysuria, flank pain, frequency, hematuria and urgency.  Musculoskeletal: Negative for back pain, joint pain and myalgias.  Skin: Negative for rash.    Neurological: Negative for dizziness, tingling, focal weakness, seizures, weakness and headaches.  Endo/Heme/Allergies: Does not bruise/bleed easily.  Psychiatric/Behavioral: Negative for depression and suicidal ideas. The patient does not have insomnia.     Allergies  Allergen Reactions  . Penicillin G Benzathine Rash    As a child    Patient Active Problem List   Diagnosis Date Noted  . Aortic atherosclerosis ([place]) 07/31/2017  . Solitary pulmonary nodule 07/31/2017  . Sprain of knee 06/05/2017  . Essential hypertension 03/28/2017  . Infection of parotid gland 01/26/2016  . Knee pain, left 04/06/2015  . ED (erectile dysfunction) 03/10/2015  . Allergic rhinitis 03/10/2015  . Hypogonadism in male 03/10/2015  . Non-alcoholic fatty liver disease 03/10/2015  . Obstructive apnea 03/12/2013  . Acid reflux 02/07/2013  . Borderline blood pressure 02/07/2013  . HLD (hyperlipidemia) 02/07/2013     Past Medical History:  Diagnosis Date  . Allergy   . Aortic atherosclerosis ([place]) 07/31/2017   Chest x-ray February 2019  . Atrial fibrillation [facility]  . Atrial fibrillation ([place])   . CPAP (continuous positive airway pressure) dependence   . Dysrhythmia   . ED (erectile dysfunction)   . GERD (gastroesophageal reflux disease)   . Heart murmur   . Hyperlipidemia   . Hypertension   . Sleep apnea   . Sleep apnea      Past Surgical History:  Procedure Laterality Date  . ABLATION  Sept 2014   heart  . ABLATION  12/2015   heart  . COLONOSCOPY WITH PROPOFOL N/A 10/23/2015   Procedure: COLONOSCOPY WITH PROPOFOL;  Surgeon: Lollie Sails, MD;  Location: [facility] ENDOSCOPY;  Service: Endoscopy;  Laterality: N/A;  . KNEE ARTHROSCOPY Left 04/09/2015   Procedure: ,left knee arthroscopy, limited synovectomy and partial medial menisectomy.;  Surgeon: Thornton Park, MD;  Location: [facility] ORS;  Service: Orthopedics;  Laterality: Left;  Marland Kitchen VARICOCELE  EXCISION      Social History   Socioeconomic History  . Marital status: Married    Spouse name: Not on file  . Number of children: Not on file  . Years of education: Not on file  . Highest education level: Not on file  Social Needs  . Financial resource strain: Not on file  . Food insecurity - worry: Not on file  . Food insecurity - inability: Not on file  . Transportation needs - medical: Not on file  . Transportation needs - non-medical: Not on file  Occupational History  . Occupation: full time    Employer: [employer]  Tobacco Use  . Smoking status: Never Smoker  . Smokeless tobacco: Never Used  Substance and Sexual Activity  . Alcohol use: Yes    Alcohol/week: 0.0 oz    Comment: on occasion with beer or liquor  . Drug use: No  . Sexual activity: Yes  Other Topics Concern  . Not on file  Social History Narrative  . Not on file     Family History  Problem Relation Age of Onset  . Cancer Mother        breast  . Alzheimer's disease Father   . Hypertension Father   . Arthritis Father   . Heart disease Brother      Current Outpatient Medications:  .  aspirin EC 81 MG tablet, Take by mouth., Disp: , Rfl:  .  benazepril (LOTENSIN) 40 MG tablet, Take 1 tablet (40 mg total) daily by mouth., Disp: 90 tablet, Rfl: 3 .  cetirizine (ZYRTEC) 10 MG tablet, Take 1 tablet (10 mg total) by mouth daily., Disp: 90 tablet, Rfl: 4 .  esomeprazole (NEXIUM) 40 MG capsule, Take 1 capsule (40 mg total) by mouth daily at 12 noon., Disp: 90 capsule, Rfl: 4 .  fexofenadine (ALLEGRA) 180 MG tablet, Take 1 tablet (180 mg total) by mouth daily., Disp: 90 tablet, Rfl: 4 .  fluticasone (FLONASE) 50 MCG/ACT nasal spray, Place 2 sprays into both nostrils daily. (Patient taking differently: Place 2 sprays into both nostrils daily as needed. ), Disp: 48 g, Rfl: 4 .  albuterol (PROVENTIL HFA;VENTOLIN HFA) 108 (90 Base) MCG/ACT inhaler, Inhale 2 puffs into the lungs every 4 (four) hours as  needed for wheezing. (Patient not taking: Reported on 08/21/2017), Disp: 1 Inhaler, Rfl: 0 .  benzonatate (TESSALON PERLES) 100 MG capsule, Take 1 capsule (100 mg total) by mouth 3 (three) times daily as needed for cough., Disp: 15 capsule, Rfl: 0 .  chlorpheniramine-HYDROcodone (TUSSIONEX PENNKINETIC ER) 10-8 MG/5ML SUER, Take 5 mLs by mouth at bedtime as needed for cough. do not drive or operate machinery while taking as can cause drowsiness., Disp: 75 mL, Rfl: 0 .  doxycycline (VIBRAMYCIN) 100 MG capsule, Take 1 capsule (100 mg total) by mouth 2 (two) times daily., Disp: 20 capsule, Rfl: 0 .  predniSONE (DELTASONE) 10 MG tablet, Start 60 mg po day one, then 50 mg po day two, taper by 10 mg daily until complete., Disp: 21 tablet, Rfl: 0   Physical exam:  Vitals:   08/21/17 1319  BP: 110/76  Pulse: 82  Resp: 18  Temp: 97.7 F (36.5 C)  TempSrc: Tympanic  Weight: 270 lb  8.1 oz (122.7 kg)  Height: 6\' 3"  (1.905 m)   Physical Exam  Constitutional: He is oriented to person, place, and time and well-developed, well-nourished, and in no distress.  HENT:  Head: Normocephalic and atraumatic.  Eyes: EOM are normal. Pupils are equal, round, and reactive to light.  Neck: Normal range of motion.  Cardiovascular: Normal rate, regular rhythm and normal heart sounds.  Pulmonary/Chest: Effort normal and breath sounds normal.  Abdominal: Soft. Bowel sounds are normal.  Neurological: He is alert and oriented to person, place, and time.  Skin: Skin is warm and dry.       CMP Latest Ref Rng & Units 06/05/2017  Glucose 65 - 99 mg/dL -  BUN 8 - 27 mg/dL -  Creatinine 0.76 - 1.27 mg/dL -  Sodium 134 - 144 mmol/L -  Potassium 3.5 - 5.2 mmol/L -  Chloride 96 - 106 mmol/L -  CO2 20 - 29 mmol/L -  Calcium 8.6 - 10.2 mg/dL -  Total Protein 6.0 - 8.5 g/dL 7.0  Total Bilirubin 0.0 - 1.2 mg/dL 0.4  Alkaline Phos 39 - 117 IU/L 107  AST 0 - 40 IU/L 52(H)  ALT 0 - 44 IU/L 97(H)   CBC Latest Ref Rng &  Units 06/05/2017  WBC 3.4 - 10.8 x10E3/uL 4.6  Hemoglobin 13.0 - 17.7 g/dL 14.2  Hematocrit 37.5 - 51.0 % 42.3  Platelets 150 - 379 x10E3/uL 200    No images are attached to the encounter.  Dg Chest 2 View  Result Date: 07/29/2017 CLINICAL DATA:  Cough and congestion.  Wheezing. EXAM: CHEST  2 VIEW COMPARISON:  August 31, 2012 FINDINGS: On the lateral view, there is a 1 x 1 cm nodular opacity overlying the heart. A nodular opacity is not confirmed on frontal view. There is no edema or consolidation. The heart size and pulmonary vascularity are normal. No adenopathy. No evident bone lesions. There is aortic atherosclerosis. IMPRESSION: 1 x 1 cm nodular opacity overlying the heart on the lateral view. This finding warrants noncontrast enhanced chest CT to further assess. No edema or consolidation.  There is aortic atherosclerosis. Aortic Atherosclerosis (ICD10-I70.0). Electronically Signed   By: Lowella Grip III M.D.   On: 07/29/2017 15:13   Ct Chest Wo Contrast  Result Date: 08/16/2017 CLINICAL DATA:  62 year old male with 10 mm pulmonary nodule on chest radiographs earlier this month which were performed for cough and congestion. The nodule projected over the heart on the lateral view. Subsequent encounter. EXAM: CT CHEST WITHOUT CONTRAST TECHNIQUE: Multidetector CT imaging of the chest was performed following the standard protocol without IV contrast. COMPARISON:  Chest radiographs 07/29/2017, 08/31/2012. FINDINGS: Cardiovascular: Minimal calcified atherosclerosis in the chest. No cardiomegaly or pericardial effusion. Mild abdominal Calcified aortic atherosclerosis. Vascular patency is not evaluated in the absence of IV contrast. Mediastinum/Nodes: No mediastinal lymphadenopathy. No hilar lymphadenopathy is evident. Negative noncontrast thoracic inlet. Lungs/Pleura: A noncalcified pulmonary nodule is identified in the central aspect of the right middle lobe corresponding to the radiographic  finding. The nodule measures 9-10 mm on series 6, image 97. It has smooth slightly lobulated margins. There is right lower lobe subpleural reticular opacity compatible with scarring. No other abnormal right lung opacity. No right pleural effusion. There is a subtle 2-3 millimeter nodular density in the posterior left upper lobe on series 6, image 45. This most resembles scarring with. There is mild subpleural scarring in the lower lingula and left lower lobe. No other abnormal left lung opacity.  No left pleural effusion. Major airways are patent and appear normal. Upper Abdomen: Negative visible noncontrast liver, gallbladder, spleen, pancreas, adrenal glands, kidneys, and bowel in the upper abdomen. Musculoskeletal: No acute osseous abnormality identified. Intermittent degenerative changes in the thoracic spine. No suspicious osseous lesion identified. IMPRESSION: 1. Confirmed 10 mm Right Middle Lobe lung nodule. This is indeterminate. Consider one of the following in 3 months for both low-risk and high-risk individuals: (a) repeat Chest CT, (b) follow-up PET-CT, or (c) tissue sampling. This recommendation follows the consensus statement: Guidelines for Management of Incidental Pulmonary Nodules Detected on CT Images: From the Fleischner Society 2017; Radiology 2017; 284:228-243. 2. Superimposed mild bilateral pulmonary scarring. No thoracic lymphadenopathy. Mild calcified aortic atherosclerosis. Electronically Signed   By: Genevie Ann M.D.   On: 08/16/2017 06:56    Assessment and plan- Patient is a 62 y.o. male with 1 cm RML lung nodule found on CT chest  Discussed that 10 mm RML nodule is concerning for possible lung cancer but could also be benign. He is a lifetime non smoker. No evidence of intra thoracic adenopathy seen on CT scan. At this time I will proceed with PET/CT scan and discuss patients case at the tumor board. If the nodule is hypermetabolic on PET- options would be surgical resection and thoracic  surgery consultation versus CT guided or bronchoscopy guided biopsy of the suspicious node. If PET does not show hypermetabolic thoracic adenopathy or metastatic disease - treatment options would be resection versus SBRT based on pathology  If the lesion is not hypermetabolic on PET- CT thorax may be repeated in 3-6 months  I will see the patient back in 2 weeks time after PET/CT and tissue diagnosis   Thank you for this kind referral and the opportunity to participate in the care of this patient   Visit Diagnosis 1. Solitary pulmonary nodule     Dr. Randa Evens, MD, MPH [place] at [facility] Pager- [phone] 08/21/2017  2:00 PM

## 2017-08-21 NOTE — Progress Notes (Signed)
New pt. And he has cold sx and cxr to r/o pneumonia and they saw something and then had f/u ct scan after he got better and saw 10 mm lung nodule. No sx from nodule

## 2017-08-21 NOTE — Progress Notes (Signed)
  Oncology Nurse Navigator Documentation  Navigator Location: [facility]-Med Onc (08/21/17 1500)   )Navigator Encounter Type: Initial MedOnc (08/21/17 1500)                       Treatment Phase: Abnormal Scans (08/21/17 1500) Barriers/Navigation Needs: Coordination of Care (08/21/17 1500)   Interventions: Coordination of Care (08/21/17 1500)   Coordination of Care: Radiology;Appts (08/21/17 1500)           met with patient during initial med-onc consultation with Dr. Janese Banks today. All questions answered at the time of visit. Pt is scheduled for PET scan on 3/7 and added for discussion at case conference on 3/7 as well. Reviewed upcoming appts with patient and informed that will be notified of results over the phone once available. Contact info given and instructed to call with any further questions or needs. Pt verbalized understanding. Nothing further needed at this time.       Time Spent with Patient: 60 (08/21/17 1500)

## 2017-08-24 ENCOUNTER — Ambulatory Visit
Admission: RE | Admit: 2017-08-24 | Discharge: 2017-08-24 | Disposition: A | Discharge status: Home / Self Care | Payer: Self-pay | Source: Ambulatory Visit | Attending: Oncology | Admitting: Oncology

## 2017-08-24 ENCOUNTER — Ambulatory Visit: Admission: RE | Admit: 2017-08-24 | Payer: Commercial Managed Care - PPO | Source: Ambulatory Visit

## 2017-08-24 ENCOUNTER — Other Ambulatory Visit: Payer: Self-pay | Admitting: Oncology

## 2017-08-24 ENCOUNTER — Ambulatory Visit
Admission: RE | Admit: 2017-08-24 | Discharge: 2017-08-24 | Disposition: A | Discharge status: Home / Self Care | Payer: Commercial Managed Care - PPO | Source: Ambulatory Visit | Attending: Oncology | Admitting: Oncology

## 2017-08-24 ENCOUNTER — Inpatient Hospital Stay: Payer: Commercial Managed Care - PPO

## 2017-08-24 DIAGNOSIS — R918 Other nonspecific abnormal finding of lung field: Secondary | ICD-10-CM

## 2017-08-24 DIAGNOSIS — R911 Solitary pulmonary nodule: Secondary | ICD-10-CM

## 2017-08-24 DIAGNOSIS — I700 Atherosclerosis of aorta: Secondary | ICD-10-CM | POA: Diagnosis not present

## 2017-08-24 LAB — GLUCOSE, CAPILLARY: Glucose-Capillary: 99 mg/dL (ref 65–99)

## 2017-08-24 MED ORDER — FLUDEOXYGLUCOSE F - 18 (FDG) INJECTION
14.1000 | Freq: Once | INTRAVENOUS | Status: AC | PRN
  Administered 2017-08-24: 14.1 via INTRAVENOUS

## 2017-08-28 ENCOUNTER — Encounter: Payer: Self-pay | Admitting: Oncology

## 2017-08-28 ENCOUNTER — Inpatient Hospital Stay (HOSPITAL_BASED_OUTPATIENT_CLINIC_OR_DEPARTMENT_OTHER): Payer: Commercial Managed Care - PPO | Admitting: Oncology

## 2017-08-28 ENCOUNTER — Ambulatory Visit: Payer: Commercial Managed Care - PPO

## 2017-08-28 VITALS — BP 116/78 | HR 81 | Temp 96.9°F | Resp 18 | Ht 75.0 in | Wt 273.7 lb

## 2017-08-28 DIAGNOSIS — R911 Solitary pulmonary nodule: Secondary | ICD-10-CM

## 2017-08-28 NOTE — Progress Notes (Signed)
Hematology/Oncology Consult note [facility]  [phone] [phone]  Patient Care Team: Guadalupe Maple, MD as PCP - General (Family Medicine) Telford Nab, RN as Registered Nurse   Name of the patient: Thomas Mercado  [id number]  12/10/1955   Date of visit: 08/28/17  Diagnosis- benign lung nodule  Chief complaint/ Reason for visit- discuss pet ct results  Heme/Onc history: Patient is a 62 year old male who recently underwent a chest x-ray for symptoms of cough and congestion  Chest x-ray on 03/09/2018 showed 1 x 1 cm overlying the heart on the lateral view for which CT scan was recommended CT chest without contrast on 08/15/2017 showed 9-10 mm nodule in the right middle lobe with smooth slightly lobulated margins.  No evidence of hilar or mediastinal adenopathy.  He is a lifetime non-smoker and drinks occasional alcohol.  His appetite is good and he denies any unintentional weight loss or aches or pains anywhere.  He continues to work and independent of his ADLs and IADLs.  His past medical history significant for hypertension, atrial fibrillation status post ablation and history of abnormal LFTs for which he sees GI  PET/ CT on 08/24/17 showed: IMPRESSION: 1. No metabolic activity associated with the RIGHT upper lobe pulmonary nodule is reassuring of a benign nodule. Recommend follow-up CT without contrast in 6 - 12 months to demonstrate size stability over time as some low-grade neoplasms can have low metabolic activity. 2.  Aortic Atherosclerosis (ICD10-I70.0).  This was compared to prior CT done at [facility] in July 2017 and was found to be stable over 20 months consistent with benign etiology  Interval history- he is doing well and denies any complaints today  ECOG PS- 0 Pain scale- 0   Review of systems- Review of Systems  Constitutional: Negative for chills, fever, malaise/fatigue and weight loss.  HENT: Negative for  congestion, ear discharge and nosebleeds.   Eyes: Negative for blurred vision.  Respiratory: Negative for cough, hemoptysis, sputum production, shortness of breath and wheezing.   Cardiovascular: Negative for chest pain, palpitations, orthopnea and claudication.  Gastrointestinal: Negative for abdominal pain, blood in stool, constipation, diarrhea, heartburn, melena, nausea and vomiting.  Genitourinary: Negative for dysuria, flank pain, frequency, hematuria and urgency.  Musculoskeletal: Negative for back pain, joint pain and myalgias.  Skin: Negative for rash.  Neurological: Negative for dizziness, tingling, focal weakness, seizures, weakness and headaches.  Endo/Heme/Allergies: Does not bruise/bleed easily.  Psychiatric/Behavioral: Negative for depression and suicidal ideas. The patient does not have insomnia.       Allergies  Allergen Reactions  . Penicillin G Benzathine Rash    As a child     Past Medical History:  Diagnosis Date  . Allergy   . Aortic atherosclerosis ([place]) 07/31/2017   Chest x-ray February 2019  . Atrial fibrillation [facility]  . Atrial fibrillation ([place])   . CPAP (continuous positive airway pressure) dependence   . Dysrhythmia   . ED (erectile dysfunction)   . GERD (gastroesophageal reflux disease)   . Heart murmur   . Hyperlipidemia   . Hypertension   . Sleep apnea   . Sleep apnea      Past Surgical History:  Procedure Laterality Date  . ABLATION  Sept 2014   heart  . ABLATION  12/2015   heart  . COLONOSCOPY WITH PROPOFOL N/A 10/23/2015   Procedure: COLONOSCOPY WITH PROPOFOL;  Surgeon: Lollie Sails, MD;  Location: [facility]  ENDOSCOPY;  Service: Endoscopy;  Laterality: N/A;  . KNEE ARTHROSCOPY Left 04/09/2015   Procedure: ,left knee arthroscopy, limited synovectomy and partial medial menisectomy.;  Surgeon: Thornton Park, MD;  Location: [facility] ORS;  Service: Orthopedics;  Laterality: Left;  Marland Kitchen VARICOCELE EXCISION       Social History   Socioeconomic History  . Marital status: Married    Spouse name: Not on file  . Number of children: Not on file  . Years of education: Not on file  . Highest education level: Not on file  Social Needs  . Financial resource strain: Not on file  . Food insecurity - worry: Not on file  . Food insecurity - inability: Not on file  . Transportation needs - medical: Not on file  . Transportation needs - non-medical: Not on file  Occupational History  . Occupation: full time    Employer: [employer]  Tobacco Use  . Smoking status: Never Smoker  . Smokeless tobacco: Never Used  Substance and Sexual Activity  . Alcohol use: Yes    Alcohol/week: 0.0 oz    Comment: on occasion with beer or liquor  . Drug use: No  . Sexual activity: Yes  Other Topics Concern  . Not on file  Social History Narrative  . Not on file    Family History  Problem Relation Age of Onset  . Breast cancer Mother   . Alzheimer's disease Father   . Hypertension Father   . Arthritis Father   . Heart disease Brother   . Emphysema Brother      Current Outpatient Medications:  .  aspirin EC 81 MG tablet, Take by mouth., Disp: , Rfl:  .  benazepril (LOTENSIN) 40 MG tablet, Take 1 tablet (40 mg total) daily by mouth., Disp: 90 tablet, Rfl: 3 .  cetirizine (ZYRTEC) 10 MG tablet, Take 1 tablet (10 mg total) by mouth daily., Disp: 90 tablet, Rfl: 4 .  esomeprazole (NEXIUM) 40 MG capsule, Take 1 capsule (40 mg total) by mouth daily at 12 noon., Disp: 90 capsule, Rfl: 4 .  fexofenadine (ALLEGRA) 180 MG tablet, Take 1 tablet (180 mg total) by mouth daily., Disp: 90 tablet, Rfl: 4 .  fluticasone (FLONASE) 50 MCG/ACT nasal spray, Place 2 sprays into both nostrils daily. (Patient taking differently: Place 2 sprays into both nostrils daily as needed. ), Disp: 48 g, Rfl: 4 .  albuterol (PROVENTIL HFA;VENTOLIN HFA) 108 (90 Base) MCG/ACT inhaler, Inhale 2 puffs into the lungs every 4 (four) hours  as needed for wheezing. (Patient not taking: Reported on 08/21/2017), Disp: 1 Inhaler, Rfl: 0 .  benzonatate (TESSALON PERLES) 100 MG capsule, Take 1 capsule (100 mg total) by mouth 3 (three) times daily as needed for cough. (Patient not taking: Reported on 08/28/2017), Disp: 15 capsule, Rfl: 0 .  chlorpheniramine-HYDROcodone (TUSSIONEX PENNKINETIC ER) 10-8 MG/5ML SUER, Take 5 mLs by mouth at bedtime as needed for cough. do not drive or operate machinery while taking as can cause drowsiness. (Patient not taking: Reported on 08/28/2017), Disp: 75 mL, Rfl: 0 .  doxycycline (VIBRAMYCIN) 100 MG capsule, Take 1 capsule (100 mg total) by mouth 2 (two) times daily. (Patient not taking: Reported on 08/28/2017), Disp: 20 capsule, Rfl: 0 .  predniSONE (DELTASONE) 10 MG tablet, Start 60 mg po day one, then 50 mg po day two, taper by 10 mg daily until complete. (Patient not taking: Reported on 08/28/2017), Disp: 21 tablet, Rfl: 0  Physical exam:  Vitals:   08/28/17  1046  BP: 116/78  Pulse: 81  Resp: 18  Temp: (!) 96.9 F (36.1 C)  TempSrc: Tympanic  Weight: 273 lb 11.2 oz (124.2 kg)  Height: 6\' 3"  (1.905 m)   Physical Exam  Constitutional: He is oriented to person, place, and time and well-developed, well-nourished, and in no distress.  HENT:  Head: Normocephalic and atraumatic.  Eyes: EOM are normal. Pupils are equal, round, and reactive to light.  Neck: Normal range of motion.  Cardiovascular: Normal rate, regular rhythm and normal heart sounds.  Pulmonary/Chest: Effort normal and breath sounds normal.  Abdominal: Soft. Bowel sounds are normal.  Neurological: He is alert and oriented to person, place, and time.  Skin: Skin is warm and dry.     CMP Latest Ref Rng & Units 06/05/2017  Glucose 65 - 99 mg/dL -  BUN 8 - 27 mg/dL -  Creatinine 0.76 - 1.27 mg/dL -  Sodium 134 - 144 mmol/L -  Potassium 3.5 - 5.2 mmol/L -  Chloride 96 - 106 mmol/L -  CO2 20 - 29 mmol/L -  Calcium 8.6 - 10.2 mg/dL -    Total Protein 6.0 - 8.5 g/dL 7.0  Total Bilirubin 0.0 - 1.2 mg/dL 0.4  Alkaline Phos 39 - 117 IU/L 107  AST 0 - 40 IU/L 52(H)  ALT 0 - 44 IU/L 97(H)   CBC Latest Ref Rng & Units 06/05/2017  WBC 3.4 - 10.8 x10E3/uL 4.6  Hemoglobin 13.0 - 17.7 g/dL 14.2  Hematocrit 37.5 - 51.0 % 42.3  Platelets 150 - 379 x10E3/uL 200    No images are attached to the encounter.  Dg Chest 2 View  Result Date: 07/29/2017 CLINICAL DATA:  Cough and congestion.  Wheezing. EXAM: CHEST  2 VIEW COMPARISON:  August 31, 2012 FINDINGS: On the lateral view, there is a 1 x 1 cm nodular opacity overlying the heart. A nodular opacity is not confirmed on frontal view. There is no edema or consolidation. The heart size and pulmonary vascularity are normal. No adenopathy. No evident bone lesions. There is aortic atherosclerosis. IMPRESSION: 1 x 1 cm nodular opacity overlying the heart on the lateral view. This finding warrants noncontrast enhanced chest CT to further assess. No edema or consolidation.  There is aortic atherosclerosis. Aortic Atherosclerosis (ICD10-I70.0). Electronically Signed   By: Lowella Grip III M.D.   On: 07/29/2017 15:13   Ct Chest Wo Contrast  Result Date: 08/16/2017 CLINICAL DATA:  62 year old male with 10 mm pulmonary nodule on chest radiographs earlier this month which were performed for cough and congestion. The nodule projected over the heart on the lateral view. Subsequent encounter. EXAM: CT CHEST WITHOUT CONTRAST TECHNIQUE: Multidetector CT imaging of the chest was performed following the standard protocol without IV contrast. COMPARISON:  Chest radiographs 07/29/2017, 08/31/2012. FINDINGS: Cardiovascular: Minimal calcified atherosclerosis in the chest. No cardiomegaly or pericardial effusion. Mild abdominal Calcified aortic atherosclerosis. Vascular patency is not evaluated in the absence of IV contrast. Mediastinum/Nodes: No mediastinal lymphadenopathy. No hilar lymphadenopathy is evident.  Negative noncontrast thoracic inlet. Lungs/Pleura: A noncalcified pulmonary nodule is identified in the central aspect of the right middle lobe corresponding to the radiographic finding. The nodule measures 9-10 mm on series 6, image 97. It has smooth slightly lobulated margins. There is right lower lobe subpleural reticular opacity compatible with scarring. No other abnormal right lung opacity. No right pleural effusion. There is a subtle 2-3 millimeter nodular density in the posterior left upper lobe on series 6, image 45. This  most resembles scarring with. There is mild subpleural scarring in the lower lingula and left lower lobe. No other abnormal left lung opacity. No left pleural effusion. Major airways are patent and appear normal. Upper Abdomen: Negative visible noncontrast liver, gallbladder, spleen, pancreas, adrenal glands, kidneys, and bowel in the upper abdomen. Musculoskeletal: No acute osseous abnormality identified. Intermittent degenerative changes in the thoracic spine. No suspicious osseous lesion identified. IMPRESSION: 1. Confirmed 10 mm Right Middle Lobe lung nodule. This is indeterminate. Consider one of the following in 3 months for both low-risk and high-risk individuals: (a) repeat Chest CT, (b) follow-up PET-CT, or (c) tissue sampling. This recommendation follows the consensus statement: Guidelines for Management of Incidental Pulmonary Nodules Detected on CT Images: From the Fleischner Society 2017; Radiology 2017; 284:228-243. 2. Superimposed mild bilateral pulmonary scarring. No thoracic lymphadenopathy. Mild calcified aortic atherosclerosis. Electronically Signed   By: Genevie Ann M.D.   On: 08/16/2017 06:56   Nm Pet Image Initial (pi) Skull Base To Thigh  Addendum Date: 08/24/2017   ADDENDUM REPORT: 08/24/2017 16:59 ADDENDUM: Outside images were made available from 01/05/2016. 9 mm nodule is stable over a 20 month interval consistent benign etiology. Electronically Signed   By: Suzy Bouchard M.D.   On: 08/24/2017 16:59   Result Date: 08/24/2017 CLINICAL DATA:  Initial treatment strategy for solitary pulmonary nodule. EXAM: NUCLEAR MEDICINE PET SKULL BASE TO THIGH TECHNIQUE: 14.1 mCi F-18 FDG was injected intravenously. Full-ring PET imaging was performed from the skull base to thigh after the radiotracer. CT data was obtained and used for attenuation correction and anatomic localization. Fasting blood glucose: 99 mg/dl Mediastinal blood pool activity: SUV max 2.2 COMPARISON:  CT 08/15/2017 FINDINGS: NECK: No hypermetabolic lymph nodes in the neck. Incidental CT findings: none CHEST: RIGHT upper lobe pulmonary nodule measures 9 mm not changed from 9 mm on comparison CT (image 107, series 3). Lesion has no associated metabolic activity. No additional pulmonary nodules. No hypermetabolic mediastinal lymph nodes. Incidental CT findings: none ABDOMEN/PELVIS: No abnormal hypermetabolic activity within the liver, pancreas, adrenal glands, or spleen. No hypermetabolic lymph nodes in the abdomen or pelvis. Incidental CT findings: Small nonobstructing RIGHT renal calculus. Atherosclerotic calcification of the aorta. Prostate normal. SKELETON: No focal hypermetabolic activity to suggest skeletal metastasis. Incidental CT findings: none IMPRESSION: 1. No metabolic activity associated with the RIGHT upper lobe pulmonary nodule is reassuring of a benign nodule. Recommend follow-up CT without contrast in 6 - 12 months to demonstrate size stability over time as some low-grade neoplasms can have low metabolic activity. 2.  Aortic Atherosclerosis (ICD10-I70.0). Electronically Signed: By: Suzy Bouchard M.D. On: 08/24/2017 15:35     Assessment and plan- Patient is a 62 y.o. male with 1 cm RML lung nodule found on CT chest  I have personally reviewed PET/CT images independently. We also discussed his case at the tumor board. Patient noted to have 10 mm RML nodule on CT which was not metabolically active  on PET. This nodule was also noted on CT done at [facility] in 2017 (junction of RUL and RML but was the same one) and hence reported to be benign given stability over 20 months  I will touch base with radiology again to see if pateint needs any more surveillance scans. He is a lifetime non smoker and given stability over 20 months, I do not think he needs any more scans in the future to follow this lung nodule   Visit Diagnosis 1. Solitary pulmonary nodule  Dr. Randa Evens, MD, MPH [place] at [facility] Pager- [phone] 08/28/2017 11:48 AM

## 2017-08-28 NOTE — Progress Notes (Signed)
No new changes noted today 

## 2017-08-29 ENCOUNTER — Telehealth: Payer: Self-pay | Admitting: *Deleted

## 2017-08-29 DIAGNOSIS — R911 Solitary pulmonary nodule: Secondary | ICD-10-CM

## 2017-08-29 NOTE — Telephone Encounter (Signed)
Per Dr. Janese Banks, schedule follow up CT scan in 12 months for lung nodule follow up. Pt informed of appt scheduled on 08/28/2018 for CT scan and will follow up with Dr. Janese Banks the following Monday in [place] to review results. Pt requested that appts be mailed to him. Instructed pt to call back if has any further questions. Pt verbalized understanding.

## 2017-09-04 ENCOUNTER — Ambulatory Visit: Payer: Commercial Managed Care - PPO | Admitting: Oncology

## 2017-09-08 ENCOUNTER — Ambulatory Visit: Payer: Self-pay

## 2017-09-08 DIAGNOSIS — L603 Nail dystrophy: Secondary | ICD-10-CM

## 2017-09-08 DIAGNOSIS — B351 Tinea unguium: Secondary | ICD-10-CM

## 2017-09-19 NOTE — Progress Notes (Signed)
Pt presents with mycotic infection of nails 1-3 Rt foot   All other systems are negative  Laser therapy administered to affected nails and tolerated well. All safety precautions were in place. RE-appointed in 4 weeks for 4th treatment

## 2017-09-22 ENCOUNTER — Other Ambulatory Visit: Payer: Self-pay | Admitting: Family Medicine

## 2017-09-22 DIAGNOSIS — K219 Gastro-esophageal reflux disease without esophagitis: Secondary | ICD-10-CM

## 2017-10-10 ENCOUNTER — Other Ambulatory Visit: Payer: Self-pay | Admitting: Family Medicine

## 2017-10-10 DIAGNOSIS — E785 Hyperlipidemia, unspecified: Secondary | ICD-10-CM

## 2017-10-10 NOTE — Telephone Encounter (Signed)
Med was d/c'd 05/01/17

## 2017-10-13 ENCOUNTER — Ambulatory Visit: Payer: Self-pay

## 2017-10-13 ENCOUNTER — Encounter: Payer: Self-pay | Admitting: Gastroenterology

## 2017-10-13 DIAGNOSIS — L603 Nail dystrophy: Secondary | ICD-10-CM

## 2017-10-13 DIAGNOSIS — B351 Tinea unguium: Secondary | ICD-10-CM

## 2017-10-18 NOTE — Progress Notes (Signed)
Pt presents with mycotic infection of nails 1-3 Rt foot   All other systems are negative  Laser therapy administered to affected nails and tolerated well. All safety precautions were in place. RE-appointed in 4 weeks for 5th treatment

## 2017-10-19 ENCOUNTER — Other Ambulatory Visit: Payer: Self-pay | Admitting: Family Medicine

## 2017-10-27 ENCOUNTER — Other Ambulatory Visit: Payer: Self-pay

## 2017-10-27 DIAGNOSIS — R748 Abnormal levels of other serum enzymes: Secondary | ICD-10-CM

## 2017-10-31 ENCOUNTER — Encounter: Payer: Commercial Managed Care - PPO | Admitting: Family Medicine

## 2017-11-04 LAB — HEPATIC FUNCTION PANEL
ALT: 84 IU/L — ABNORMAL HIGH (ref 0–44)
AST: 55 IU/L — ABNORMAL HIGH (ref 0–40)
Albumin: 4.6 g/dL (ref 3.6–4.8)
Alkaline Phosphatase: 94 IU/L (ref 39–117)
Bilirubin Total: 0.5 mg/dL (ref 0.0–1.2)
Bilirubin, Direct: 0.17 mg/dL (ref 0.00–0.40)
Total Protein: 6.8 g/dL (ref 6.0–8.5)

## 2017-11-23 ENCOUNTER — Telehealth: Payer: Self-pay

## 2017-11-23 NOTE — Telephone Encounter (Signed)
-----   Message from Lucilla Lame, MD sent at 11/16/2017  3:59 PM EDT ----- Patient know that his liver enzymes are at about the same level they were 5 months ago.  Find out if the patient has lost any weight as recommended at the last visit.  Otherwise if he has not lost weight encouraged him to lose weight and follow-up with repeat liver enzymes in 3 months.Thomas Mercado

## 2017-11-23 NOTE — Telephone Encounter (Signed)
Pt notified of lab results. Pt stated he has lost 3 lbs since his last office appt but is starting a metabolic weight loss program that he will be doing for 30 days. I have advised him we will recheck this in 3 months. He has an appt with his PCP next week and is hoping he will repeat those labs.

## 2017-11-28 ENCOUNTER — Telehealth: Payer: Self-pay | Admitting: Family Medicine

## 2017-11-28 ENCOUNTER — Ambulatory Visit (INDEPENDENT_AMBULATORY_CARE_PROVIDER_SITE_OTHER): Payer: Commercial Managed Care - PPO | Admitting: Family Medicine

## 2017-11-28 ENCOUNTER — Encounter: Payer: Self-pay | Admitting: Family Medicine

## 2017-11-28 VITALS — BP 94/67 | HR 75 | Ht 74.5 in | Wt 272.0 lb

## 2017-11-28 DIAGNOSIS — E785 Hyperlipidemia, unspecified: Secondary | ICD-10-CM

## 2017-11-28 DIAGNOSIS — R748 Abnormal levels of other serum enzymes: Secondary | ICD-10-CM

## 2017-11-28 DIAGNOSIS — K219 Gastro-esophageal reflux disease without esophagitis: Secondary | ICD-10-CM

## 2017-11-28 DIAGNOSIS — G4733 Obstructive sleep apnea (adult) (pediatric): Secondary | ICD-10-CM

## 2017-11-28 DIAGNOSIS — Z125 Encounter for screening for malignant neoplasm of prostate: Secondary | ICD-10-CM | POA: Diagnosis not present

## 2017-11-28 DIAGNOSIS — Z0000 Encounter for general adult medical examination without abnormal findings: Secondary | ICD-10-CM | POA: Diagnosis not present

## 2017-11-28 DIAGNOSIS — K760 Fatty (change of) liver, not elsewhere classified: Secondary | ICD-10-CM

## 2017-11-28 DIAGNOSIS — M65341 Trigger finger, right ring finger: Secondary | ICD-10-CM | POA: Diagnosis not present

## 2017-11-28 DIAGNOSIS — E291 Testicular hypofunction: Secondary | ICD-10-CM | POA: Diagnosis not present

## 2017-11-28 DIAGNOSIS — Z13220 Encounter for screening for lipoid disorders: Secondary | ICD-10-CM | POA: Diagnosis not present

## 2017-11-28 DIAGNOSIS — Z1329 Encounter for screening for other suspected endocrine disorder: Secondary | ICD-10-CM | POA: Diagnosis not present

## 2017-11-28 DIAGNOSIS — M6530 Trigger finger, unspecified finger: Secondary | ICD-10-CM | POA: Insufficient documentation

## 2017-11-28 DIAGNOSIS — I10 Essential (primary) hypertension: Secondary | ICD-10-CM

## 2017-11-28 LAB — URINALYSIS, ROUTINE W REFLEX MICROSCOPIC
Bilirubin, UA: NEGATIVE
Glucose, UA: NEGATIVE
Nitrite, UA: NEGATIVE
Protein, UA: NEGATIVE
RBC, UA: NEGATIVE
Specific Gravity, UA: 1.01 (ref 1.005–1.030)
Urobilinogen, Ur: 0.2 mg/dL (ref 0.2–1.0)
pH, UA: 6.5 (ref 5.0–7.5)

## 2017-11-28 LAB — MICROSCOPIC EXAMINATION: Bacteria, UA: NONE SEEN

## 2017-11-28 MED ORDER — ESOMEPRAZOLE MAGNESIUM 40 MG PO CPDR: 40.0000 mg | DELAYED_RELEASE_CAPSULE | Freq: Every day | ORAL | 4 refills | Status: DC

## 2017-11-28 MED ORDER — MELOXICAM 15 MG PO TABS: 15.0000 mg | ORAL_TABLET | Freq: Every day | ORAL | 3 refills | Status: DC

## 2017-11-28 MED ORDER — FEXOFENADINE HCL 180 MG PO TABS: 180.0000 mg | ORAL_TABLET | Freq: Every day | ORAL | 4 refills | Status: DC

## 2017-11-28 MED ORDER — BENAZEPRIL HCL 40 MG PO TABS: 40.0000 mg | ORAL_TABLET | Freq: Every day | ORAL | 4 refills | Status: DC

## 2017-11-28 NOTE — Assessment & Plan Note (Addendum)
Discussed diet exercise nutrition weight loss to treat liver disease

## 2017-11-28 NOTE — Telephone Encounter (Signed)
Patient states Allegra not covered by insurance patient to buy over-the-counter or another brand.

## 2017-11-28 NOTE — Telephone Encounter (Signed)
Copied from [place] [id number]. Topic: General - Other >> Nov 28, 2017  2:11 PM Yvette Rack wrote: Reason for CRM: Pt called and stated his insurance doesn't cover Allegra, Pt would like something different   >> Nov 28, 2017  2:16 PM Yvette Rack wrote: Pt would like this to go to        [id number], [place] [phone] (Phone) [phone] (Fax)

## 2017-11-28 NOTE — Assessment & Plan Note (Signed)
Uses without problems

## 2017-11-28 NOTE — Assessment & Plan Note (Signed)
The current medical regimen is effective;  continue present plan and medications.  

## 2017-11-28 NOTE — Progress Notes (Signed)
BP 94/67   Pulse 75   Ht 6' 2.5" (1.892 m)   Wt 272 lb (123.4 kg)   SpO2 98%   BMI 34.46 kg/m    Subjective:    Patient ID: Thomas Mercado, male    DOB: [birth year], 62 y.o.   MRN: [id number]  HPI: Thomas Mercado is a 62 y.o. male  Chief Complaint  Patient presents with  . Annual Exam   Patient with right fourth finger triggering on flexion comes on occasionally goes away occasionally does not happen every time but is been ongoing the last couple of months.  She was some left lateral lower thigh numbness tingling type sensation after walking a lot and standing.  Does not really bother him with sitting.  Today in the office has a big belt its type putting pressure in the anterior femoral cutaneous nerve area.  Patient at work though wears looser pants and belt.  Also discussed and reviewed fatty liver with patient and laboratory data.  Patient is about ready to begin a serious weight loss program.  Relevant past medical, surgical, family and social history reviewed and updated as indicated. Interim medical history since our last visit reviewed. Allergies and medications reviewed and updated.  Review of Systems  Constitutional: Negative.   HENT: Negative.   Eyes: Negative.   Respiratory: Negative.   Cardiovascular: Negative.   Gastrointestinal: Negative.   Endocrine: Negative.   Genitourinary: Negative.   Musculoskeletal: Negative.   Skin: Negative.   Allergic/Immunologic: Negative.   Neurological: Negative.   Hematological: Negative.   Psychiatric/Behavioral: Negative.     Per HPI unless specifically indicated above     Objective:    BP 94/67   Pulse 75   Ht 6' 2.5" (1.892 m)   Wt 272 lb (123.4 kg)   SpO2 98%   BMI 34.46 kg/m   Wt Readings from Last 3 Encounters:  11/28/17 272 lb (123.4 kg)  08/28/17 273 lb 11.2 oz (124.2 kg)  08/21/17 270 lb 8.1 oz (122.7 kg)    Physical Exam  Constitutional: He is oriented to person, place, and time. He appears  well-developed and well-nourished.  HENT:  Head: Normocephalic and atraumatic.  Right Ear: External ear normal.  Left Ear: External ear normal.  Eyes: Pupils are equal, round, and reactive to light. Conjunctivae and EOM are normal.  Neck: Normal range of motion. Neck supple.  Cardiovascular: Normal rate, regular rhythm, normal heart sounds and intact distal pulses.  Pulmonary/Chest: Effort normal and breath sounds normal.  Abdominal: Soft. Bowel sounds are normal. There is no splenomegaly or hepatomegaly.  Genitourinary: Rectum normal, prostate normal and penis normal.  Musculoskeletal: Normal range of motion.  Neurological: He is alert and oriented to person, place, and time. He has normal reflexes.  Skin: No rash noted. No erythema.  Psychiatric: He has a normal mood and affect. His behavior is normal. Judgment and thought content normal.    Results for orders placed or performed in visit on 10/27/17  Hepatic function panel  Result Value Ref Range   Total Protein 6.8 6.0 - 8.5 g/dL   Albumin 4.6 3.6 - 4.8 g/dL   Bilirubin Total 0.5 0.0 - 1.2 mg/dL   Bilirubin, Direct 0.17 0.00 - 0.40 mg/dL   Alkaline Phosphatase 94 39 - 117 IU/L   AST 55 (H) 0 - 40 IU/L   ALT 84 (H) 0 - 44 IU/L      Assessment & Plan:   Problem List Items Addressed This Visit  Cardiovascular and Mediastinum   Essential hypertension - Primary    The current medical regimen is effective;  continue present plan and medications.       Relevant Medications   benazepril (LOTENSIN) 40 MG tablet   Other Relevant Orders   CBC with Differential/Platelet   Lipid panel   Comprehensive metabolic panel   Urinalysis, Routine w reflex microscopic     Respiratory   Obstructive apnea    Uses without problems        Digestive   Acid reflux   Relevant Medications   esomeprazole (NEXIUM) 40 MG capsule   Non-alcoholic fatty liver disease    Discussed diet exercise nutrition weight loss to treat liver  disease        Endocrine   Hypogonadism in male   Relevant Orders   PSA     Musculoskeletal and Integument   Trigger finger of right hand    Discussed care and treatment will observe give meloxicam.  Also give meloxicam for potential meralgia paresthetica symptoms        Other   Hyperlipidemia    The current medical regimen is effective;  continue present plan and medications.       Relevant Medications   benazepril (LOTENSIN) 40 MG tablet    Other Visit Diagnoses    Elevated liver enzymes       Relevant Orders   Comprehensive metabolic panel   Annual physical exam       Relevant Orders   CBC with Differential/Platelet   Lipid panel   Comprehensive metabolic panel   PSA   TSH   Urinalysis, Routine w reflex microscopic   Thyroid disorder screen       Relevant Orders   TSH   Screening cholesterol level       Relevant Orders   Lipid panel   Prostate cancer screening       Relevant Orders   PSA   PE (physical exam), annual           Follow up plan: Return in about 6 months (around 05/30/2018).

## 2017-11-28 NOTE — Assessment & Plan Note (Signed)
Discussed care and treatment will observe give meloxicam.  Also give meloxicam for potential meralgia paresthetica symptoms

## 2017-11-29 ENCOUNTER — Encounter: Payer: Self-pay | Admitting: Family Medicine

## 2017-11-29 LAB — COMPREHENSIVE METABOLIC PANEL
ALT: 84 IU/L — ABNORMAL HIGH (ref 0–44)
AST: 61 IU/L — ABNORMAL HIGH (ref 0–40)
Albumin/Globulin Ratio: 2 (ref 1.2–2.2)
Albumin: 4.8 g/dL (ref 3.6–4.8)
Alkaline Phosphatase: 88 IU/L (ref 39–117)
BUN/Creatinine Ratio: 28 — ABNORMAL HIGH (ref 10–24)
BUN: 22 mg/dL (ref 8–27)
Bilirubin Total: 0.9 mg/dL (ref 0.0–1.2)
CO2: 22 mmol/L (ref 20–29)
Calcium: 9.8 mg/dL (ref 8.6–10.2)
Chloride: 101 mmol/L (ref 96–106)
Creatinine, Ser: 0.8 mg/dL (ref 0.76–1.27)
GFR calc Af Amer: 110 mL/min/{1.73_m2} (ref >59)
GFR calc non Af Amer: 95 mL/min/{1.73_m2} (ref >59)
Globulin, Total: 2.4 g/dL (ref 1.5–4.5)
Glucose: 81 mg/dL (ref 65–99)
Potassium: 4.5 mmol/L (ref 3.5–5.2)
Sodium: 139 mmol/L (ref 134–144)
Total Protein: 7.2 g/dL (ref 6.0–8.5)

## 2017-11-29 LAB — CBC WITH DIFFERENTIAL/PLATELET
Basophils Absolute: 0 10*3/uL (ref 0.0–0.2)
Basos: 1 %
EOS (ABSOLUTE): 0.2 10*3/uL (ref 0.0–0.4)
Eos: 5 %
Hematocrit: 42.6 % (ref 37.5–51.0)
Hemoglobin: 14 g/dL (ref 13.0–17.7)
Immature Grans (Abs): 0 10*3/uL (ref 0.0–0.1)
Immature Granulocytes: 0 %
Lymphocytes Absolute: 1.7 10*3/uL (ref 0.7–3.1)
Lymphs: 34 %
MCH: 29.7 pg (ref 26.6–33.0)
MCHC: 32.9 g/dL (ref 31.5–35.7)
MCV: 90 fL (ref 79–97)
Monocytes Absolute: 0.6 10*3/uL (ref 0.1–0.9)
Monocytes: 12 %
Neutrophils Absolute: 2.4 10*3/uL (ref 1.4–7.0)
Neutrophils: 48 %
Platelets: 196 10*3/uL (ref 150–450)
RBC: 4.71 x10E6/uL (ref 4.14–5.80)
RDW: 13.8 % (ref 12.3–15.4)
WBC: 5 10*3/uL (ref 3.4–10.8)

## 2017-11-29 LAB — PSA: Prostate Specific Ag, Serum: 1.3 ng/mL (ref 0.0–4.0)

## 2017-11-29 LAB — LIPID PANEL
Chol/HDL Ratio: 3.7 ratio (ref 0.0–5.0)
Cholesterol, Total: 128 mg/dL (ref 100–199)
HDL: 35 mg/dL — ABNORMAL LOW (ref >39)
LDL Calculated: 80 mg/dL (ref 0–99)
Triglycerides: 64 mg/dL (ref 0–149)
VLDL Cholesterol Cal: 13 mg/dL (ref 5–40)

## 2017-11-29 LAB — TSH: TSH: 1.59 u[IU]/mL (ref 0.450–4.500)

## 2018-02-21 ENCOUNTER — Telehealth: Payer: Self-pay | Admitting: Family Medicine

## 2018-02-21 NOTE — Telephone Encounter (Signed)
Copied from [place] [id number]. Topic: Quick Communication - See Telephone Encounter >> Feb 21, 2018 10:10 AM Thomas Mercado wrote: CRM for notification. See Telephone encounter for: 02/21/18.   Pt called inquiring about labs he states he had previously discussed with Dr. Jeananne Rama. Pt would like to discuss when/if he needs to have blood work done to check his liver enzymes. Pt states that he has lost 44lbs since his last visit and was wondering if he was able to come off of his blood pressure medication.

## 2018-02-21 NOTE — Telephone Encounter (Signed)
Patient was transferred to provider for telephone conversation.   

## 2018-02-21 NOTE — Telephone Encounter (Signed)
Phone call Discussed with patient 44 pound weight loss and is just retired will try cutting Benzapril in half to 20 mg a day observing blood pressure will continue working on weight loss and will check liver function next office visit.

## 2018-02-21 NOTE — Telephone Encounter (Signed)
Call pt 

## 2018-02-21 NOTE — Telephone Encounter (Signed)
Please advise 

## 2018-03-05 ENCOUNTER — Other Ambulatory Visit: Payer: Self-pay | Admitting: Family Medicine

## 2018-03-05 DIAGNOSIS — E785 Hyperlipidemia, unspecified: Secondary | ICD-10-CM

## 2018-04-18 ENCOUNTER — Encounter: Payer: Self-pay | Admitting: Family Medicine

## 2018-04-18 ENCOUNTER — Ambulatory Visit: Payer: Commercial Managed Care - PPO | Admitting: Family Medicine

## 2018-04-18 VITALS — BP 107/70 | HR 82 | Temp 98.4°F | Ht 75.0 in | Wt 218.3 lb

## 2018-04-18 DIAGNOSIS — J309 Allergic rhinitis, unspecified: Secondary | ICD-10-CM | POA: Diagnosis not present

## 2018-04-18 DIAGNOSIS — R079 Chest pain, unspecified: Secondary | ICD-10-CM | POA: Diagnosis not present

## 2018-04-18 DIAGNOSIS — Z23 Encounter for immunization: Secondary | ICD-10-CM | POA: Diagnosis not present

## 2018-04-18 NOTE — Assessment & Plan Note (Signed)
Allergic rhinitis possibly causing patient's severe symptoms will observe uses Flonase if problems persist may consider ENT referral.

## 2018-04-18 NOTE — Patient Instructions (Signed)

## 2018-04-18 NOTE — Assessment & Plan Note (Addendum)
Discussed chest pain most likely musculoskeletal will observe if further problems will reevaluate patient education given EKG normal

## 2018-04-18 NOTE — Progress Notes (Signed)
BP 107/70 (BP Location: Left Arm, Patient Position: Sitting, Cuff Size: Normal)   Pulse 82   Temp 98.4 F (36.9 C) (Oral)   Ht 6\' 3"  (1.905 m)   Wt 218 lb 4.8 oz (99 kg)   SpO2 97%   BMI 27.29 kg/m    Subjective:    Patient ID: Thomas Mercado, male    DOB: 1955/09/26, 62 y.o.   MRN: [id number]  HPI: Thomas Mercado is a 62 y.o. male  Chief Complaint  Patient presents with  . Tinnitus    Patient states he hears a muffled sound that comes and goes  Patient with several concerns. Right ear intermittent seems to be blocked up with muffled type sound like is got wax in his ear.  This been ongoing for the last several weeks.  No real drainage or congestion or other sinus type symptoms.  Patient also with left chest discomfort not associated with exertion no nausea vomiting diaphoresis no radiation.  No association with food. Is associated with physical activity trimming bushes earlier in the week.  Has muscle soreness from this which may be associated with his chest symptoms.   Relevant past medical, surgical, family and social history reviewed and updated as indicated. Interim medical history since our last visit reviewed. Allergies and medications reviewed and updated.  Review of Systems  Constitutional: Negative.   Respiratory: Negative.   Cardiovascular: Negative.     Per HPI unless specifically indicated above     Objective:    BP 107/70 (BP Location: Left Arm, Patient Position: Sitting, Cuff Size: Normal)   Pulse 82   Temp 98.4 F (36.9 C) (Oral)   Ht 6\' 3"  (1.905 m)   Wt 218 lb 4.8 oz (99 kg)   SpO2 97%   BMI 27.29 kg/m   Wt Readings from Last 3 Encounters:  04/18/18 218 lb 4.8 oz (99 kg)  11/28/17 272 lb (123.4 kg)  08/28/17 273 lb 11.2 oz (124.2 kg)    Physical Exam  Constitutional: He is oriented to person, place, and time. He appears well-developed and well-nourished.  HENT:  Head: Normocephalic and atraumatic.  Eyes: Conjunctivae and EOM are normal.    Neck: Normal range of motion.  Cardiovascular: Normal rate, regular rhythm and normal heart sounds.  Pulmonary/Chest: Effort normal and breath sounds normal.  Musculoskeletal: Normal range of motion.  Neurological: He is alert and oriented to person, place, and time.  Skin: No erythema.  Psychiatric: He has a normal mood and affect. His behavior is normal. Judgment and thought content normal.    Results for orders placed or performed in visit on 11/28/17  Microscopic Examination  Result Value Ref Range   WBC, UA 0-5 0 - 5 /hpf   RBC, UA 0-2 0 - 2 /hpf   Epithelial Cells (non renal) CANCELED    Bacteria, UA None seen None seen/Few  CBC with Differential/Platelet  Result Value Ref Range   WBC 5.0 3.4 - 10.8 x10E3/uL   RBC 4.71 4.14 - 5.80 x10E6/uL   Hemoglobin 14.0 13.0 - 17.7 g/dL   Hematocrit 42.6 37.5 - 51.0 %   MCV 90 79 - 97 fL   MCH 29.7 26.6 - 33.0 pg   MCHC 32.9 31.5 - 35.7 g/dL   RDW 13.8 12.3 - 15.4 %   Platelets 196 150 - 450 x10E3/uL   Neutrophils 48 Not Estab. %   Lymphs 34 Not Estab. %   Monocytes 12 Not Estab. %   Eos 5 Not  Estab. %   Basos 1 Not Estab. %   Neutrophils Absolute 2.4 1.4 - 7.0 x10E3/uL   Lymphocytes Absolute 1.7 0.7 - 3.1 x10E3/uL   Monocytes Absolute 0.6 0.1 - 0.9 x10E3/uL   EOS (ABSOLUTE) 0.2 0.0 - 0.4 x10E3/uL   Basophils Absolute 0.0 0.0 - 0.2 x10E3/uL   Immature Granulocytes 0 Not Estab. %   Immature Grans (Abs) 0.0 0.0 - 0.1 x10E3/uL  Lipid panel  Result Value Ref Range   Cholesterol, Total 128 100 - 199 mg/dL   Triglycerides 64 0 - 149 mg/dL   HDL 35 (L) >39 mg/dL   VLDL Cholesterol Cal 13 5 - 40 mg/dL   LDL Calculated 80 0 - 99 mg/dL   Chol/HDL Ratio 3.7 0.0 - 5.0 ratio  Comprehensive metabolic panel  Result Value Ref Range   Glucose 81 65 - 99 mg/dL   BUN 22 8 - 27 mg/dL   Creatinine, Ser 0.80 0.76 - 1.27 mg/dL   GFR calc non Af Amer 95 >59 mL/min/1.73   GFR calc Af Amer 110 >59 mL/min/1.73   BUN/Creatinine Ratio 28 (H) 10 -  24   Sodium 139 134 - 144 mmol/L   Potassium 4.5 3.5 - 5.2 mmol/L   Chloride 101 96 - 106 mmol/L   CO2 22 20 - 29 mmol/L   Calcium 9.8 8.6 - 10.2 mg/dL   Total Protein 7.2 6.0 - 8.5 g/dL   Albumin 4.8 3.6 - 4.8 g/dL   Globulin, Total 2.4 1.5 - 4.5 g/dL   Albumin/Globulin Ratio 2.0 1.2 - 2.2   Bilirubin Total 0.9 0.0 - 1.2 mg/dL   Alkaline Phosphatase 88 39 - 117 IU/L   AST 61 (H) 0 - 40 IU/L   ALT 84 (H) 0 - 44 IU/L  PSA  Result Value Ref Range   Prostate Specific Ag, Serum 1.3 0.0 - 4.0 ng/mL  TSH  Result Value Ref Range   TSH 1.590 0.450 - 4.500 uIU/mL  Urinalysis, Routine w reflex microscopic  Result Value Ref Range   Specific Gravity, UA 1.010 1.005 - 1.030   pH, UA 6.5 5.0 - 7.5   Color, UA Yellow Yellow   Appearance Ur Clear Clear   Leukocytes, UA Trace (A) Negative   Protein, UA Negative Negative/Trace   Glucose, UA Negative Negative   Ketones, UA Trace (A) Negative   RBC, UA Negative Negative   Bilirubin, UA Negative Negative   Urobilinogen, Ur 0.2 0.2 - 1.0 mg/dL   Nitrite, UA Negative Negative   Microscopic Examination See below:       Assessment & Plan:   Problem List Items Addressed This Visit      Respiratory   Allergic rhinitis    Allergic rhinitis possibly causing patient's severe symptoms will observe uses Flonase if problems persist may consider ENT referral.        Other   Chest pain    Discussed chest pain most likely musculoskeletal will observe if further problems will reevaluate patient education given      Relevant Orders   EKG 12-Lead (Completed)    Other Visit Diagnoses    Need for influenza vaccination    -  Primary   Relevant Orders   Flu Vaccine QUAD 6+ mos PF IM (Fluarix Quad PF) (Completed)       Follow up plan: Return if symptoms worsen or fail to improve, for As scheduled.

## 2018-05-30 ENCOUNTER — Ambulatory Visit: Payer: Commercial Managed Care - PPO | Admitting: Family Medicine

## 2018-05-30 ENCOUNTER — Encounter: Payer: Self-pay | Admitting: Family Medicine

## 2018-05-30 VITALS — BP 111/64 | HR 74 | Temp 97.5°F | Ht 75.0 in | Wt 218.6 lb

## 2018-05-30 DIAGNOSIS — E785 Hyperlipidemia, unspecified: Secondary | ICD-10-CM | POA: Diagnosis not present

## 2018-05-30 DIAGNOSIS — I10 Essential (primary) hypertension: Secondary | ICD-10-CM | POA: Diagnosis not present

## 2018-05-30 DIAGNOSIS — I700 Atherosclerosis of aorta: Secondary | ICD-10-CM

## 2018-05-30 NOTE — Assessment & Plan Note (Signed)
The current medical regimen is effective;  continue present plan and medications.  

## 2018-05-30 NOTE — Progress Notes (Signed)
BP 111/64 (BP Location: Left Arm, Patient Position: Sitting, Cuff Size: Normal)   Pulse 74   Temp (!) 97.5 F (36.4 C) (Oral)   Ht 6\' 3"  (1.905 m)   Wt 218 lb 9.6 oz (99.2 kg)   SpO2 97%   BMI 27.32 kg/m    Subjective:    Patient ID: Thomas Mercado, male    DOB: 08/26/1955, 62 y.o.   MRN: [id number]  HPI: Thomas Mercado is a 62 y.o. male  Chief Complaint  Patient presents with  . Hypertension   Patient follow-up hypertension taking Benzapril 40 mg half a tablet a day no further lightheaded dizzy type spells. Patient with history of hypercholesterol had been taking pravastatin 20 mg but with weight loss has been able to stop this medication and maintain good cholesterol which was reviewed and with good readings this summer. Allergies also stable along with reflux on medications.  Relevant past medical, surgical, family and social history reviewed and updated as indicated. Interim medical history since our last visit reviewed. Allergies and medications reviewed and updated.  Review of Systems  Constitutional: Negative.   Respiratory: Negative.   Cardiovascular: Negative.     Per HPI unless specifically indicated above     Objective:    BP 111/64 (BP Location: Left Arm, Patient Position: Sitting, Cuff Size: Normal)   Pulse 74   Temp (!) 97.5 F (36.4 C) (Oral)   Ht 6\' 3"  (1.905 m)   Wt 218 lb 9.6 oz (99.2 kg)   SpO2 97%   BMI 27.32 kg/m   Wt Readings from Last 3 Encounters:  05/30/18 218 lb 9.6 oz (99.2 kg)  04/18/18 218 lb 4.8 oz (99 kg)  11/28/17 272 lb (123.4 kg)    Physical Exam  Constitutional: He is oriented to person, place, and time. He appears well-developed and well-nourished.  HENT:  Head: Normocephalic and atraumatic.  Eyes: Conjunctivae and EOM are normal.  Neck: Normal range of motion.  Cardiovascular: Normal rate, regular rhythm and normal heart sounds.  Pulmonary/Chest: Effort normal and breath sounds normal.  Musculoskeletal: Normal range of  motion.  Neurological: He is alert and oriented to person, place, and time.  Skin: No erythema.  Psychiatric: He has a normal mood and affect. His behavior is normal. Judgment and thought content normal.    Results for orders placed or performed in visit on 11/28/17  Microscopic Examination  Result Value Ref Range   WBC, UA 0-5 0 - 5 /hpf   RBC, UA 0-2 0 - 2 /hpf   Epithelial Cells (non renal) CANCELED    Bacteria, UA None seen None seen/Few  CBC with Differential/Platelet  Result Value Ref Range   WBC 5.0 3.4 - 10.8 x10E3/uL   RBC 4.71 4.14 - 5.80 x10E6/uL   Hemoglobin 14.0 13.0 - 17.7 g/dL   Hematocrit 42.6 37.5 - 51.0 %   MCV 90 79 - 97 fL   MCH 29.7 26.6 - 33.0 pg   MCHC 32.9 31.5 - 35.7 g/dL   RDW 13.8 12.3 - 15.4 %   Platelets 196 150 - 450 x10E3/uL   Neutrophils 48 Not Estab. %   Lymphs 34 Not Estab. %   Monocytes 12 Not Estab. %   Eos 5 Not Estab. %   Basos 1 Not Estab. %   Neutrophils Absolute 2.4 1.4 - 7.0 x10E3/uL   Lymphocytes Absolute 1.7 0.7 - 3.1 x10E3/uL   Monocytes Absolute 0.6 0.1 - 0.9 x10E3/uL   EOS (ABSOLUTE) 0.2 0.0 -  0.4 x10E3/uL   Basophils Absolute 0.0 0.0 - 0.2 x10E3/uL   Immature Granulocytes 0 Not Estab. %   Immature Grans (Abs) 0.0 0.0 - 0.1 x10E3/uL  Lipid panel  Result Value Ref Range   Cholesterol, Total 128 100 - 199 mg/dL   Triglycerides 64 0 - 149 mg/dL   HDL 35 (L) >39 mg/dL   VLDL Cholesterol Cal 13 5 - 40 mg/dL   LDL Calculated 80 0 - 99 mg/dL   Chol/HDL Ratio 3.7 0.0 - 5.0 ratio  Comprehensive metabolic panel  Result Value Ref Range   Glucose 81 65 - 99 mg/dL   BUN 22 8 - 27 mg/dL   Creatinine, Ser 0.80 0.76 - 1.27 mg/dL   GFR calc non Af Amer 95 >59 mL/min/1.73   GFR calc Af Amer 110 >59 mL/min/1.73   BUN/Creatinine Ratio 28 (H) 10 - 24   Sodium 139 134 - 144 mmol/L   Potassium 4.5 3.5 - 5.2 mmol/L   Chloride 101 96 - 106 mmol/L   CO2 22 20 - 29 mmol/L   Calcium 9.8 8.6 - 10.2 mg/dL   Total Protein 7.2 6.0 - 8.5 g/dL    Albumin 4.8 3.6 - 4.8 g/dL   Globulin, Total 2.4 1.5 - 4.5 g/dL   Albumin/Globulin Ratio 2.0 1.2 - 2.2   Bilirubin Total 0.9 0.0 - 1.2 mg/dL   Alkaline Phosphatase 88 39 - 117 IU/L   AST 61 (H) 0 - 40 IU/L   ALT 84 (H) 0 - 44 IU/L  PSA  Result Value Ref Range   Prostate Specific Ag, Serum 1.3 0.0 - 4.0 ng/mL  TSH  Result Value Ref Range   TSH 1.590 0.450 - 4.500 uIU/mL  Urinalysis, Routine w reflex microscopic  Result Value Ref Range   Specific Gravity, UA 1.010 1.005 - 1.030   pH, UA 6.5 5.0 - 7.5   Color, UA Yellow Yellow   Appearance Ur Clear Clear   Leukocytes, UA Trace (A) Negative   Protein, UA Negative Negative/Trace   Glucose, UA Negative Negative   Ketones, UA Trace (A) Negative   RBC, UA Negative Negative   Bilirubin, UA Negative Negative   Urobilinogen, Ur 0.2 0.2 - 1.0 mg/dL   Nitrite, UA Negative Negative   Microscopic Examination See below:       Assessment & Plan:   Problem List Items Addressed This Visit      Cardiovascular and Mediastinum   Essential hypertension - Primary    The current medical regimen is effective;  continue present plan and medications.       Relevant Orders   Basic metabolic panel   Aortic atherosclerosis (HCC)    Stable no sx        Other   Hyperlipidemia    The current medical regimen is effective;  continue present plan and medications.           Follow up plan: Return in about 6 months (around 11/29/2018) for Physical Exam.

## 2018-05-30 NOTE — Assessment & Plan Note (Signed)
Stable no sx

## 2018-05-31 ENCOUNTER — Encounter: Payer: Self-pay | Admitting: Family Medicine

## 2018-05-31 LAB — BASIC METABOLIC PANEL
BUN/Creatinine Ratio: 23 (ref 10–24)
BUN: 19 mg/dL (ref 8–27)
CO2: 26 mmol/L (ref 20–29)
Calcium: 9.6 mg/dL (ref 8.6–10.2)
Chloride: 99 mmol/L (ref 96–106)
Creatinine, Ser: 0.81 mg/dL (ref 0.76–1.27)
GFR calc Af Amer: 109 mL/min/{1.73_m2} (ref >59)
GFR calc non Af Amer: 95 mL/min/{1.73_m2} (ref >59)
Glucose: 86 mg/dL (ref 65–99)
Potassium: 4.1 mmol/L (ref 3.5–5.2)
Sodium: 142 mmol/L (ref 134–144)

## 2018-06-06 ENCOUNTER — Other Ambulatory Visit: Payer: Self-pay | Admitting: Family Medicine

## 2018-06-06 ENCOUNTER — Telehealth: Payer: Self-pay | Admitting: Family Medicine

## 2018-06-06 DIAGNOSIS — R748 Abnormal levels of other serum enzymes: Secondary | ICD-10-CM

## 2018-06-06 NOTE — Telephone Encounter (Signed)
Rec'd call from pt. To inquire about recent lab results.  Advised that the BMP was normal; reviewed the letter that was sent by Dr. Jeananne Rama.  Questioned about his liver enzymes?  Advised that liver enzymes were not checked.  Stated that he and Dr. Jeananne Rama discussed this, and he understood that the liver enzymes were going to be rechecked, with recent lab work.  Advised will send a note to Dr. Jeananne Rama with pt's. request.  Agreed with plan.

## 2018-06-06 NOTE — Telephone Encounter (Signed)
Requested medication (s) are due for refill today: yes  Requested medication (s) are on the active medication list: no  Last refill:  03/28/17  Future visit scheduled: yes in 6 months  Notes to clinic:  Not on active med list   Requested Prescriptions  Pending Prescriptions Disp Refills   cetirizine (ZYRTEC) 10 MG tablet [Pharmacy Med Name: CETIRIZINE HCL 10 MG TABLET] 90 tablet 4    Sig: TAKE 1 TABLET BY MOUTH EVERY DAY     Ear, Nose, and Throat:  Antihistamines Passed - 06/06/2018  2:23 AM      Passed - Valid encounter within last 12 months    Recent Outpatient Visits          1 week ago Essential hypertension   [facility] Crissman, Jeannette How, MD   1 month ago Need for influenza vaccination   [facility] Guadalupe Maple, MD   6 months ago Essential hypertension   [place], Jeannette How, MD   1 year ago Essential hypertension   [place], Jeannette How, MD   1 year ago Hyperlipidemia, unspecified hyperlipidemia type   [place], [place]      Future Appointments            In 6 months Crissman, Jeannette How, MD [facility]

## 2018-06-06 NOTE — Telephone Encounter (Signed)
Patient notified of Dr. Jeananne Rama message. Pt stated he will be waiting for the results

## 2018-06-06 NOTE — Telephone Encounter (Signed)
Call pt Let him know I inadvertently left off the liver enzymes he is asking about.  They were added onto his previous blood work that was done last week.  I should get the report back hopefully tomorrow. We will let him know.

## 2018-06-11 ENCOUNTER — Other Ambulatory Visit: Payer: Commercial Managed Care - PPO

## 2018-06-11 DIAGNOSIS — R748 Abnormal levels of other serum enzymes: Secondary | ICD-10-CM

## 2018-06-12 ENCOUNTER — Encounter: Payer: Self-pay | Admitting: Family Medicine

## 2018-06-12 LAB — COMPREHENSIVE METABOLIC PANEL
ALT: 29 IU/L (ref 0–44)
AST: 25 IU/L (ref 0–40)
Albumin/Globulin Ratio: 2.4 — ABNORMAL HIGH (ref 1.2–2.2)
Albumin: 4.4 g/dL (ref 3.6–4.8)
Alkaline Phosphatase: 82 IU/L (ref 39–117)
BUN/Creatinine Ratio: 25 — ABNORMAL HIGH (ref 10–24)
BUN: 20 mg/dL (ref 8–27)
Bilirubin Total: 0.5 mg/dL (ref 0.0–1.2)
CO2: 22 mmol/L (ref 20–29)
Calcium: 9.3 mg/dL (ref 8.6–10.2)
Chloride: 104 mmol/L (ref 96–106)
Creatinine, Ser: 0.81 mg/dL (ref 0.76–1.27)
GFR calc Af Amer: 109 mL/min/{1.73_m2} (ref >59)
GFR calc non Af Amer: 95 mL/min/{1.73_m2} (ref >59)
Globulin, Total: 1.8 g/dL (ref 1.5–4.5)
Glucose: 120 mg/dL — ABNORMAL HIGH (ref 65–99)
Potassium: 4.3 mmol/L (ref 3.5–5.2)
Sodium: 142 mmol/L (ref 134–144)
Total Protein: 6.2 g/dL (ref 6.0–8.5)

## 2018-07-04 DIAGNOSIS — D485 Neoplasm of uncertain behavior of skin: Secondary | ICD-10-CM | POA: Diagnosis not present

## 2018-07-04 DIAGNOSIS — L570 Actinic keratosis: Secondary | ICD-10-CM | POA: Diagnosis not present

## 2018-07-04 DIAGNOSIS — Z859 Personal history of malignant neoplasm, unspecified: Secondary | ICD-10-CM | POA: Diagnosis not present

## 2018-07-04 DIAGNOSIS — Z85828 Personal history of other malignant neoplasm of skin: Secondary | ICD-10-CM | POA: Diagnosis not present

## 2018-07-04 DIAGNOSIS — Z1283 Encounter for screening for malignant neoplasm of skin: Secondary | ICD-10-CM | POA: Diagnosis not present

## 2018-07-04 DIAGNOSIS — C44111 Basal cell carcinoma of skin of unspecified eyelid, including canthus: Secondary | ICD-10-CM | POA: Diagnosis not present

## 2018-07-04 DIAGNOSIS — Z872 Personal history of diseases of the skin and subcutaneous tissue: Secondary | ICD-10-CM | POA: Diagnosis not present

## 2018-07-23 DIAGNOSIS — C441192 Basal cell carcinoma of skin of left lower eyelid, including canthus: Secondary | ICD-10-CM | POA: Diagnosis not present

## 2018-08-28 ENCOUNTER — Ambulatory Visit
Admission: RE | Admit: 2018-08-28 | Discharge: 2018-08-28 | Disposition: A | Discharge status: Home / Self Care | Payer: Commercial Managed Care - PPO | Source: Ambulatory Visit | Attending: Oncology | Admitting: Oncology

## 2018-08-28 DIAGNOSIS — R911 Solitary pulmonary nodule: Secondary | ICD-10-CM

## 2018-09-03 ENCOUNTER — Inpatient Hospital Stay: Payer: Commercial Managed Care - PPO | Admitting: Oncology

## 2018-09-03 ENCOUNTER — Telehealth: Payer: Self-pay | Admitting: *Deleted

## 2018-09-03 ENCOUNTER — Ambulatory Visit: Payer: Commercial Managed Care - PPO | Admitting: Oncology

## 2018-09-03 NOTE — Telephone Encounter (Signed)
Patient came to the cancer center today and did not get the message that we were going to tell him his results over the phone we are trying to decrease the possible spread of infection to our patients.  I spoke to the patient over the phone and let him know that his 8 mm right  middle lobe nodule has been stable ever since 2017 per the guidelines he no longer has to come and have follow-up for.  And is agreeable and thankful that he does not have to come back

## 2018-12-03 ENCOUNTER — Encounter: Payer: Commercial Managed Care - PPO | Admitting: Family Medicine

## 2018-12-06 ENCOUNTER — Encounter: Payer: Self-pay | Admitting: Family Medicine

## 2018-12-12 ENCOUNTER — Other Ambulatory Visit: Payer: Self-pay

## 2018-12-12 ENCOUNTER — Ambulatory Visit (INDEPENDENT_AMBULATORY_CARE_PROVIDER_SITE_OTHER): Payer: Commercial Managed Care - PPO | Admitting: Family Medicine

## 2018-12-12 ENCOUNTER — Encounter: Payer: Self-pay | Admitting: Family Medicine

## 2018-12-12 VITALS — BP 125/79 | HR 69 | Temp 98.2°F | Ht 74.0 in | Wt 222.0 lb

## 2018-12-12 DIAGNOSIS — J309 Allergic rhinitis, unspecified: Secondary | ICD-10-CM

## 2018-12-12 DIAGNOSIS — Z0000 Encounter for general adult medical examination without abnormal findings: Secondary | ICD-10-CM

## 2018-12-12 DIAGNOSIS — I700 Atherosclerosis of aorta: Secondary | ICD-10-CM

## 2018-12-12 DIAGNOSIS — G4733 Obstructive sleep apnea (adult) (pediatric): Secondary | ICD-10-CM

## 2018-12-12 DIAGNOSIS — E785 Hyperlipidemia, unspecified: Secondary | ICD-10-CM

## 2018-12-12 DIAGNOSIS — I10 Essential (primary) hypertension: Secondary | ICD-10-CM | POA: Diagnosis not present

## 2018-12-12 LAB — UA/M W/RFLX CULTURE, ROUTINE
Bilirubin, UA: NEGATIVE
Glucose, UA: NEGATIVE
Ketones, UA: NEGATIVE
Leukocytes,UA: NEGATIVE
Nitrite, UA: NEGATIVE
Protein,UA: NEGATIVE
RBC, UA: NEGATIVE
Specific Gravity, UA: 1.02 (ref 1.005–1.030)
Urobilinogen, Ur: 1 mg/dL (ref 0.2–1.0)
pH, UA: 5.5 (ref 5.0–7.5)

## 2018-12-12 NOTE — Progress Notes (Signed)
BP 125/79    Pulse 69    Temp 98.2 F (36.8 C) (Oral)    Ht 6\' 2"  (1.88 m)    Wt 222 lb (100.7 kg)    SpO2 97%    BMI 28.50 kg/m    Subjective:    Patient ID: Thomas Mercado, male    DOB: 15-Feb-1956, 63 y.o.   MRN: [id number]  HPI: Thomas Mercado is a 63 y.o. male presenting on 12/12/2018 for comprehensive medical examination. Current medical complaints include:see below  Taking half tab benazepril daily, which seems to control BPs very well. Denies dizziness, CP, SOB, HAs. Home readings 120s/70s. Exercising and eating well.   Cholesterol is currently diet controlled. No issues.   Allergies have been under good control this year. Taking zyrtec and flonase as needed without breakthrough sxs.   Taking nexium regularly for GERD. No breakthrough sxs.   He currently lives with: Interim Problems from his last visit: no  Depression Screen done today and results listed below:  Depression screen [facility] 2/9 12/12/2018 05/30/2018 03/28/2017 09/21/2016  Decreased Interest 0 0 0 0  Down, Depressed, Hopeless 0 0 0 0  PHQ - 2 Score 0 0 0 0  Altered sleeping 2 - - -  Tired, decreased energy 0 - - -  Change in appetite 0 - - -  Feeling bad or failure about yourself  0 - - -  Trouble concentrating 0 - - -  Moving slowly or fidgety/restless 0 - - -  Suicidal thoughts 0 - - -  PHQ-9 Score 2 - - -    The patient does not have a history of falls. I did not complete a risk assessment for falls. A plan of care for falls was not documented.   Past Medical History:  Past Medical History:  Diagnosis Date   Allergy    Aortic atherosclerosis ([place]) 07/31/2017   Chest x-ray February 2019   Atrial fibrillation [facility]   Atrial fibrillation ([place])    CPAP (continuous positive airway pressure) dependence    Dysrhythmia    ED (erectile dysfunction)    GERD (gastroesophageal reflux disease)    Heart murmur    Hyperlipidemia    Hypertension    Sleep apnea     Sleep apnea     Surgical History:  Past Surgical History:  Procedure Laterality Date   ABLATION  Sept 2014   heart   ABLATION  12/2015   heart   COLONOSCOPY WITH PROPOFOL N/A 10/23/2015   Procedure: COLONOSCOPY WITH PROPOFOL;  Surgeon: Lollie Sails, MD;  Location[facility];  Service: Endoscopy;  Laterality: N/A;   KNEE ARTHROSCOPY Left 04/09/2015   Procedure: ,left knee arthroscopy, limited synovectomy and partial medial menisectomy.;  Surgeon: Thornton Park, MD;  Location: [facility] ORS;  Service: Orthopedics;  Laterality: Left;   VARICOCELE EXCISION      Medications:  Current Outpatient Medications on File Prior to Visit  Medication Sig   aspirin EC 81 MG tablet Take by mouth.   benazepril (LOTENSIN) 40 MG tablet Take 1 tablet (40 mg total) by mouth daily.   cetirizine (ZYRTEC) 10 MG tablet TAKE 1 TABLET BY MOUTH EVERY DAY   esomeprazole (NEXIUM) 40 MG capsule Take 1 capsule (40 mg total) by mouth daily at 12 noon.   fluticasone (FLONASE) 50 MCG/ACT nasal spray PLACE 2 SPRAYS INTO BOTH NOSTRILS DAILY.   No current facility-administered medications on file prior to visit.  Allergies:  Allergies  Allergen Reactions   Penicillin G Benzathine Rash    As a child    Social History:  Social History   Socioeconomic History   Marital status: Married    Spouse name: Not on file   Number of children: Not on file   Years of education: Not on file   Highest education level: Not on file  Occupational History   Occupation: full time    Employer: [employer]  Social Needs   Financial resource strain: Not on file   Food insecurity    Worry: Not on file    Inability: Not on file   Transportation needs    Medical: Not on file    Non-medical: Not on file  Tobacco Use   Smoking status: Never Smoker   Smokeless tobacco: Never Used  Substance and Sexual Activity   Alcohol use: Yes    Alcohol/week: 0.0 standard drinks    Comment: on  occasion with beer or liquor   Drug use: No   Sexual activity: Yes  Lifestyle   Physical activity    Days per week: Not on file    Minutes per session: Not on file   Stress: Not on file  Relationships   Social connections    Talks on phone: Not on file    Gets together: Not on file    Attends religious service: Not on file    Active member of club or organization: Not on file    Attends meetings of clubs or organizations: Not on file    Relationship status: Not on file   Intimate partner violence    Fear of current or ex partner: Not on file    Emotionally abused: Not on file    Physically abused: Not on file    Forced sexual activity: Not on file  Other Topics Concern   Not on file  Social History Narrative   Not on file   Social History   Tobacco Use  Smoking Status Never Smoker  Smokeless Tobacco Never Used   Social History   Substance and Sexual Activity  Alcohol Use Yes   Alcohol/week: 0.0 standard drinks   Comment: on occasion with beer or liquor    Family History:  Family History  Problem Relation Age of Onset   Breast cancer Mother    Alzheimer's disease Father    Hypertension Father    Arthritis Father    Heart disease Brother    Emphysema Brother     Past medical history, surgical history, medications, allergies, family history and social history reviewed with patient today and changes made to appropriate areas of the chart.   Review of Systems - General ROS: negative Psychological ROS: negative Ophthalmic ROS: negative ENT ROS: negative Allergy and Immunology ROS: negative Hematological and Lymphatic ROS: negative Endocrine ROS: negative Respiratory ROS: no cough, shortness of breath, or wheezing Cardiovascular ROS: no chest pain or dyspnea on exertion Gastrointestinal ROS: no abdominal pain, change in bowel habits, or black or bloody stools Genito-Urinary ROS: no dysuria, trouble voiding, or hematuria Musculoskeletal ROS:  negative Neurological ROS: no TIA or stroke symptoms Dermatological ROS: negative All other ROS negative except what is listed above and in the HPI.      Objective:    BP 125/79    Pulse 69    Temp 98.2 F (36.8 C) (Oral)    Ht 6\' 2"  (1.88 m)    Wt 222 lb (100.7 kg)  SpO2 97%    BMI 28.50 kg/m   Wt Readings from Last 3 Encounters:  12/12/18 222 lb (100.7 kg)  05/30/18 218 lb 9.6 oz (99.2 kg)  04/18/18 218 lb 4.8 oz (99 kg)    Physical Exam Vitals signs and nursing note reviewed. Exam conducted with a chaperone present.  Constitutional:      General: He is not in acute distress.    Appearance: He is well-developed.  HENT:     Head: Atraumatic.     Right Ear: Tympanic membrane and external ear normal.     Left Ear: Tympanic membrane and external ear normal.     Nose: Nose normal.     Mouth/Throat:     Mouth: Mucous membranes are moist.     Pharynx: Oropharynx is clear.  Eyes:     General: No scleral icterus.    Conjunctiva/sclera: Conjunctivae normal.     Pupils: Pupils are equal, round, and reactive to light.  Neck:     Musculoskeletal: Normal range of motion and neck supple.  Cardiovascular:     Rate and Rhythm: Normal rate and regular rhythm.     Heart sounds: Normal heart sounds. No murmur.  Pulmonary:     Effort: Pulmonary effort is normal. No respiratory distress.     Breath sounds: Normal breath sounds.  Abdominal:     General: Bowel sounds are normal. There is no distension.     Palpations: Abdomen is soft. There is no mass.     Tenderness: There is no abdominal tenderness. There is no guarding.  Genitourinary:    Prostate: Normal.  Musculoskeletal: Normal range of motion.        General: No tenderness.  Skin:    General: Skin is warm and dry.     Findings: No rash.  Neurological:     General: No focal deficit present.     Mental Status: He is alert and oriented to person, place, and time.     Deep Tendon Reflexes: Reflexes are normal and symmetric.    Psychiatric:        Mood and Affect: Mood normal.        Behavior: Behavior normal.        Thought Content: Thought content normal.        Judgment: Judgment normal.     Results for orders placed or performed in visit on 12/12/18  CBC with Differential/Platelet  Result Value Ref Range   WBC 4.4 3.4 - 10.8 x10E3/uL   RBC 4.75 4.14 - 5.80 x10E6/uL   Hemoglobin 14.2 13.0 - 17.7 g/dL   Hematocrit 42.0 37.5 - 51.0 %   MCV 88 79 - 97 fL   MCH 29.9 26.6 - 33.0 pg   MCHC 33.8 31.5 - 35.7 g/dL   RDW 12.7 11.6 - 15.4 %   Platelets 153 150 - 450 x10E3/uL   Neutrophils 49 Not Estab. %   Lymphs 35 Not Estab. %   Monocytes 10 Not Estab. %   Eos 5 Not Estab. %   Basos 1 Not Estab. %   Neutrophils Absolute 2.2 1.4 - 7.0 x10E3/uL   Lymphocytes Absolute 1.5 0.7 - 3.1 x10E3/uL   Monocytes Absolute 0.4 0.1 - 0.9 x10E3/uL   EOS (ABSOLUTE) 0.2 0.0 - 0.4 x10E3/uL   Basophils Absolute 0.1 0.0 - 0.2 x10E3/uL   Immature Granulocytes 0 Not Estab. %   Immature Grans (Abs) 0.0 0.0 - 0.1 x10E3/uL  Comprehensive metabolic panel  Result Value Ref Range  Glucose 94 65 - 99 mg/dL   BUN 23 8 - 27 mg/dL   Creatinine, Ser 0.69 (L) 0.76 - 1.27 mg/dL   GFR calc non Af Amer 100 >59 mL/min/1.73   GFR calc Af Amer 116 >59 mL/min/1.73   BUN/Creatinine Ratio 33 (H) 10 - 24   Sodium 142 134 - 144 mmol/L   Potassium 4.3 3.5 - 5.2 mmol/L   Chloride 106 96 - 106 mmol/L   CO2 24 20 - 29 mmol/L   Calcium 9.5 8.6 - 10.2 mg/dL   Total Protein 6.5 6.0 - 8.5 g/dL   Albumin 4.8 3.8 - 4.8 g/dL   Globulin, Total 1.7 1.5 - 4.5 g/dL   Albumin/Globulin Ratio 2.8 (H) 1.2 - 2.2   Bilirubin Total 0.6 0.0 - 1.2 mg/dL   Alkaline Phosphatase 73 39 - 117 IU/L   AST 17 0 - 40 IU/L   ALT 19 0 - 44 IU/L  Lipid Panel w/o Chol/HDL Ratio  Result Value Ref Range   Cholesterol, Total 177 100 - 199 mg/dL   Triglycerides 78 0 - 149 mg/dL   HDL 46 >39 mg/dL   VLDL Cholesterol Cal 16 5 - 40 mg/dL   LDL Calculated 115 (H) 0 - 99  mg/dL  UA/M w/rflx Culture, Routine   Specimen: Urine   URINE  Result Value Ref Range   Specific Gravity, UA 1.020 1.005 - 1.030   pH, UA 5.5 5.0 - 7.5   Color, UA Yellow Yellow   Appearance Ur Clear Clear   Leukocytes,UA Negative Negative   Protein,UA Negative Negative/Trace   Glucose, UA Negative Negative   Ketones, UA Negative Negative   RBC, UA Negative Negative   Bilirubin, UA Negative Negative   Urobilinogen, Ur 1.0 0.2 - 1.0 mg/dL   Nitrite, UA Negative Negative      Assessment & Plan:   Problem List Items Addressed This Visit      Cardiovascular and Mediastinum   Essential hypertension - Primary    Stable and WNL, continue current regimen      Relevant Orders   CBC with Differential/Platelet (Completed)   Comprehensive metabolic panel (Completed)   Aortic atherosclerosis (HCC)    Cholesterol diet controlled. Recheck lipids today and adjust as needed        Respiratory   Obstructive apnea    Stable on CPAP, continue current regimen      Allergic rhinitis    Stable and under good control, continue current regimen        Other   Hyperlipidemia    Recheck lipids, adjust as needed      Relevant Orders   Lipid Panel w/o Chol/HDL Ratio (Completed)    Other Visit Diagnoses    Annual physical exam       Relevant Orders   UA/M w/rflx Culture, Routine (Completed)       Discussed aspirin prophylaxis for myocardial infarction prevention and decision was made to continue ASA  LABORATORY TESTING:  Health maintenance labs ordered today as discussed above.   The natural history of prostate cancer and ongoing controversy regarding screening and potential treatment outcomes of prostate cancer has been discussed with the patient. The meaning of a false positive PSA and a false negative PSA has been discussed. He indicates understanding of the limitations of this screening test and wishes not to proceed with screening PSA testing.   IMMUNIZATIONS:   - Tdap:  Tetanus vaccination status reviewed: last tetanus booster within 10 years. - Influenza: Postponed  to flu season  SCREENING: - Colonoscopy: Up to date  Discussed with patient purpose of the colonoscopy is to detect colon cancer at curable precancerous or early stages   PATIENT COUNSELING:    Sexuality: Discussed sexually transmitted diseases, partner selection, use of condoms, avoidance of unintended pregnancy  and contraceptive alternatives.   Advised to avoid cigarette smoking.  I discussed with the patient that most people either abstain from alcohol or drink within safe limits (<=14/week and <=4 drinks/occasion for males, <=7/weeks and <= 3 drinks/occasion for females) and that the risk for alcohol disorders and other health effects rises proportionally with the number of drinks per week and how often a drinker exceeds daily limits.  Discussed cessation/primary prevention of drug use and availability of treatment for abuse.   Diet: Encouraged to adjust caloric intake to maintain  or achieve ideal body weight, to reduce intake of dietary saturated fat and total fat, to limit sodium intake by avoiding high sodium foods and not adding table salt, and to maintain adequate dietary potassium and calcium preferably from fresh fruits, vegetables, and low-fat dairy products.    stressed the importance of regular exercise  Injury prevention: Discussed safety belts, safety helmets, smoke detector, smoking near bedding or upholstery.   Dental health: Discussed importance of regular tooth brushing, flossing, and dental visits.   Follow up plan: NEXT PREVENTATIVE PHYSICAL DUE IN 1 YEAR. Return in about 6 months (around 06/13/2019) for 6 month f/u.

## 2018-12-13 LAB — CBC WITH DIFFERENTIAL/PLATELET
Basophils Absolute: 0.1 10*3/uL (ref 0.0–0.2)
Basos: 1 %
EOS (ABSOLUTE): 0.2 10*3/uL (ref 0.0–0.4)
Eos: 5 %
Hematocrit: 42 % (ref 37.5–51.0)
Hemoglobin: 14.2 g/dL (ref 13.0–17.7)
Immature Grans (Abs): 0 10*3/uL (ref 0.0–0.1)
Immature Granulocytes: 0 %
Lymphocytes Absolute: 1.5 10*3/uL (ref 0.7–3.1)
Lymphs: 35 %
MCH: 29.9 pg (ref 26.6–33.0)
MCHC: 33.8 g/dL (ref 31.5–35.7)
MCV: 88 fL (ref 79–97)
Monocytes Absolute: 0.4 10*3/uL (ref 0.1–0.9)
Monocytes: 10 %
Neutrophils Absolute: 2.2 10*3/uL (ref 1.4–7.0)
Neutrophils: 49 %
Platelets: 153 10*3/uL (ref 150–450)
RBC: 4.75 x10E6/uL (ref 4.14–5.80)
RDW: 12.7 % (ref 11.6–15.4)
WBC: 4.4 10*3/uL (ref 3.4–10.8)

## 2018-12-13 LAB — LIPID PANEL W/O CHOL/HDL RATIO
Cholesterol, Total: 177 mg/dL (ref 100–199)
HDL: 46 mg/dL (ref >39)
LDL Calculated: 115 mg/dL — ABNORMAL HIGH (ref 0–99)
Triglycerides: 78 mg/dL (ref 0–149)
VLDL Cholesterol Cal: 16 mg/dL (ref 5–40)

## 2018-12-13 LAB — COMPREHENSIVE METABOLIC PANEL
ALT: 19 IU/L (ref 0–44)
AST: 17 IU/L (ref 0–40)
Albumin/Globulin Ratio: 2.8 — ABNORMAL HIGH (ref 1.2–2.2)
Albumin: 4.8 g/dL (ref 3.8–4.8)
Alkaline Phosphatase: 73 IU/L (ref 39–117)
BUN/Creatinine Ratio: 33 — ABNORMAL HIGH (ref 10–24)
BUN: 23 mg/dL (ref 8–27)
Bilirubin Total: 0.6 mg/dL (ref 0.0–1.2)
CO2: 24 mmol/L (ref 20–29)
Calcium: 9.5 mg/dL (ref 8.6–10.2)
Chloride: 106 mmol/L (ref 96–106)
Creatinine, Ser: 0.69 mg/dL — ABNORMAL LOW (ref 0.76–1.27)
GFR calc Af Amer: 116 mL/min/{1.73_m2} (ref >59)
GFR calc non Af Amer: 100 mL/min/{1.73_m2} (ref >59)
Globulin, Total: 1.7 g/dL (ref 1.5–4.5)
Glucose: 94 mg/dL (ref 65–99)
Potassium: 4.3 mmol/L (ref 3.5–5.2)
Sodium: 142 mmol/L (ref 134–144)
Total Protein: 6.5 g/dL (ref 6.0–8.5)

## 2018-12-15 NOTE — Assessment & Plan Note (Signed)
Stable on CPAP, continue current regimen 

## 2018-12-15 NOTE — Assessment & Plan Note (Signed)
Stable and under good control, continue current regimen 

## 2018-12-15 NOTE — Assessment & Plan Note (Signed)
Stable and WNL, continue current regimen 

## 2018-12-15 NOTE — Assessment & Plan Note (Signed)
Recheck lipids, adjust as needed 

## 2018-12-15 NOTE — Assessment & Plan Note (Signed)
Cholesterol diet controlled. Recheck lipids today and adjust as needed

## 2019-02-15 ENCOUNTER — Other Ambulatory Visit: Payer: Self-pay | Admitting: Family Medicine

## 2019-02-15 DIAGNOSIS — K219 Gastro-esophageal reflux disease without esophagitis: Secondary | ICD-10-CM

## 2019-04-15 ENCOUNTER — Telehealth: Payer: Self-pay

## 2019-04-15 NOTE — Telephone Encounter (Signed)
Not due for pneumonia until next year. Can have shingrix if covered by his insurance.

## 2019-04-15 NOTE — Telephone Encounter (Signed)
Is there a need for this patient to have a pneumonia 23? Copied from [place] [id number]. Topic: General - Inquiry >> Apr 15, 2019  2:22 PM Reyne Dumas L wrote: Reason for CRM:   Pt wants to know if he is due for pneumonia and shingles shot.  Pt would like to get them done at the same time as flu shot if he is due. Pt can be reached at [phone]

## 2019-04-15 NOTE — Telephone Encounter (Signed)
Can you sign a prescription for this, placed in your signature folder.

## 2019-04-15 NOTE — Telephone Encounter (Signed)
Done

## 2019-04-16 ENCOUNTER — Ambulatory Visit (INDEPENDENT_AMBULATORY_CARE_PROVIDER_SITE_OTHER): Payer: Commercial Managed Care - PPO

## 2019-04-16 ENCOUNTER — Other Ambulatory Visit: Payer: Self-pay

## 2019-04-16 DIAGNOSIS — Z23 Encounter for immunization: Secondary | ICD-10-CM

## 2019-06-25 ENCOUNTER — Ambulatory Visit: Payer: Commercial Managed Care - PPO | Admitting: Family Medicine

## 2019-06-26 ENCOUNTER — Ambulatory Visit (INDEPENDENT_AMBULATORY_CARE_PROVIDER_SITE_OTHER): Payer: Commercial Managed Care - PPO | Admitting: Family Medicine

## 2019-06-26 ENCOUNTER — Other Ambulatory Visit: Payer: Self-pay

## 2019-06-26 ENCOUNTER — Encounter: Payer: Self-pay | Admitting: Family Medicine

## 2019-06-26 VITALS — BP 126/78 | HR 77 | Ht 76.0 in | Wt 232.0 lb

## 2019-06-26 DIAGNOSIS — M2550 Pain in unspecified joint: Secondary | ICD-10-CM

## 2019-06-26 DIAGNOSIS — I700 Atherosclerosis of aorta: Secondary | ICD-10-CM | POA: Diagnosis not present

## 2019-06-26 DIAGNOSIS — E785 Hyperlipidemia, unspecified: Secondary | ICD-10-CM | POA: Diagnosis not present

## 2019-06-26 DIAGNOSIS — K219 Gastro-esophageal reflux disease without esophagitis: Secondary | ICD-10-CM

## 2019-06-26 DIAGNOSIS — J309 Allergic rhinitis, unspecified: Secondary | ICD-10-CM

## 2019-06-26 DIAGNOSIS — I10 Essential (primary) hypertension: Secondary | ICD-10-CM | POA: Diagnosis not present

## 2019-06-26 DIAGNOSIS — R439 Unspecified disturbances of smell and taste: Secondary | ICD-10-CM

## 2019-06-26 MED ORDER — CETIRIZINE HCL 10 MG PO TABS: 10.0000 mg | ORAL_TABLET | Freq: Every day | ORAL | 3 refills | Status: DC

## 2019-06-26 MED ORDER — BENAZEPRIL HCL 40 MG PO TABS: 40.0000 mg | ORAL_TABLET | Freq: Every day | ORAL | 1 refills | Status: DC

## 2019-06-26 NOTE — Assessment & Plan Note (Signed)
Stable and under good control, continue current regimen 

## 2019-06-26 NOTE — Progress Notes (Signed)
BP 126/78   Pulse 77   Ht 6\' 4"  (1.93 m)   Wt 232 lb (105.2 kg)   BMI 28.24 kg/m    Subjective:    Patient ID: Thomas Mercado, male    DOB: 11-30-1955, 64 y.o.   MRN: [id number]  HPI: Thomas Mercado is a 64 y.o. male  Chief Complaint  Patient presents with  . Hypertension  . Hyperlipidemia    . This visit was completed via WebEx due to the restrictions of the COVID-19 pandemic. All issues as above were discussed and addressed. Physical exam was done as above through visual confirmation on WebEx. If it was felt that the patient should be evaluated in the office, they were directed there. The patient verbally consented to this visit. . Location of the patient: home . Location of the provider: work . Those involved with this call:  . Provider: Merrie Roof, PA-C . CMA: Lesle Chris, [place] . Front Desk/Registration: Jill Side  . Time spent on call: 25 minutes with patient face to face via video conference. More than 50% of this time was spent in counseling and coordination of care. 5 minutes total spent in review of patient's record and preparation of their chart. I verified patient identity using two factors (patient name and date of birth). Patient consents verbally to being seen via telemedicine visit today.   Patient presents today for 6 month f/u chronic conditions.   Aches and pains, arthritis stiffness which is not new. Taking OTC pain relievers prn for these.   Smell changes since October, coming and going, sometimes smells a chemical-like smell. No issues with his allergy sxs lately, consistently taking zyrtec and flonase regimen which seems to do a good job. Does not notice a pattern with when the smells are happening. They do not last long and otherwise his senses seem intact.   HTN - home BPs running 120s/80s. Taking benazepril faithfully without issues. Denies CP, SOB, HAs, dizziness.   HLD - Currently diet controlled. Eating healthy diet and staying very active.  GERD  - stable on nexium, no breakthrough sxs, N/V abdominal pain.   Relevant past medical, surgical, family and social history reviewed and updated as indicated. Interim medical history since our last visit reviewed. Allergies and medications reviewed and updated.  Review of Systems  Per HPI unless specifically indicated above     Objective:    BP 126/78   Pulse 77   Ht 6\' 4"  (1.93 m)   Wt 232 lb (105.2 kg)   BMI 28.24 kg/m   Wt Readings from Last 3 Encounters:  06/26/19 232 lb (105.2 kg)  12/12/18 222 lb (100.7 kg)  05/30/18 218 lb 9.6 oz (99.2 kg)    Physical Exam Vitals and nursing note reviewed.  Constitutional:      General: He is not in acute distress.    Appearance: Normal appearance.  HENT:     Head: Atraumatic.     Right Ear: External ear normal.     Left Ear: External ear normal.     Nose: Nose normal. No congestion.     Mouth/Throat:     Mouth: Mucous membranes are moist.     Pharynx: Oropharynx is clear.  Eyes:     Extraocular Movements: Extraocular movements intact.     Conjunctiva/sclera: Conjunctivae normal.  Pulmonary:     Effort: Pulmonary effort is normal. No respiratory distress.  Musculoskeletal:        General: Normal range of motion.  Cervical back: Normal range of motion.  Skin:    General: Skin is dry.     Findings: No erythema or rash.  Neurological:     Mental Status: He is oriented to person, place, and time.  Psychiatric:        Mood and Affect: Mood normal.        Thought Content: Thought content normal.        Judgment: Judgment normal.     Results for orders placed or performed in visit on 12/12/18  CBC with Differential/Platelet  Result Value Ref Range   WBC 4.4 3.4 - 10.8 x10E3/uL   RBC 4.75 4.14 - 5.80 x10E6/uL   Hemoglobin 14.2 13.0 - 17.7 g/dL   Hematocrit 42.0 37.5 - 51.0 %   MCV 88 79 - 97 fL   MCH 29.9 26.6 - 33.0 pg   MCHC 33.8 31.5 - 35.7 g/dL   RDW 12.7 11.6 - 15.4 %   Platelets 153 150 - 450 x10E3/uL    Neutrophils 49 Not Estab. %   Lymphs 35 Not Estab. %   Monocytes 10 Not Estab. %   Eos 5 Not Estab. %   Basos 1 Not Estab. %   Neutrophils Absolute 2.2 1.4 - 7.0 x10E3/uL   Lymphocytes Absolute 1.5 0.7 - 3.1 x10E3/uL   Monocytes Absolute 0.4 0.1 - 0.9 x10E3/uL   EOS (ABSOLUTE) 0.2 0.0 - 0.4 x10E3/uL   Basophils Absolute 0.1 0.0 - 0.2 x10E3/uL   Immature Granulocytes 0 Not Estab. %   Immature Grans (Abs) 0.0 0.0 - 0.1 x10E3/uL  Comprehensive metabolic panel  Result Value Ref Range   Glucose 94 65 - 99 mg/dL   BUN 23 8 - 27 mg/dL   Creatinine, Ser 0.69 (L) 0.76 - 1.27 mg/dL   GFR calc non Af Amer 100 >59 mL/min/1.73   GFR calc Af Amer 116 >59 mL/min/1.73   BUN/Creatinine Ratio 33 (H) 10 - 24   Sodium 142 134 - 144 mmol/L   Potassium 4.3 3.5 - 5.2 mmol/L   Chloride 106 96 - 106 mmol/L   CO2 24 20 - 29 mmol/L   Calcium 9.5 8.6 - 10.2 mg/dL   Total Protein 6.5 6.0 - 8.5 g/dL   Albumin 4.8 3.8 - 4.8 g/dL   Globulin, Total 1.7 1.5 - 4.5 g/dL   Albumin/Globulin Ratio 2.8 (H) 1.2 - 2.2   Bilirubin Total 0.6 0.0 - 1.2 mg/dL   Alkaline Phosphatase 73 39 - 117 IU/L   AST 17 0 - 40 IU/L   ALT 19 0 - 44 IU/L  Lipid Panel w/o Chol/HDL Ratio  Result Value Ref Range   Cholesterol, Total 177 100 - 199 mg/dL   Triglycerides 78 0 - 149 mg/dL   HDL 46 >39 mg/dL   VLDL Cholesterol Cal 16 5 - 40 mg/dL   LDL Calculated 115 (H) 0 - 99 mg/dL  UA/M w/rflx Culture, Routine   Specimen: Urine   URINE  Result Value Ref Range   Specific Gravity, UA 1.020 1.005 - 1.030   pH, UA 5.5 5.0 - 7.5   Color, UA Yellow Yellow   Appearance Ur Clear Clear   Leukocytes,UA Negative Negative   Protein,UA Negative Negative/Trace   Glucose, UA Negative Negative   Ketones, UA Negative Negative   RBC, UA Negative Negative   Bilirubin, UA Negative Negative   Urobilinogen, Ur 1.0 0.2 - 1.0 mg/dL   Nitrite, UA Negative Negative      Assessment & Plan:  Problem List Items Addressed This Visit       Cardiovascular and Mediastinum   Essential hypertension - Primary    BPs stable and under good control. Continue current regimen      Relevant Medications   benazepril (LOTENSIN) 40 MG tablet   Other Relevant Orders   Comprehensive metabolic panel   Aortic atherosclerosis (HCC)    Recheck lipids, adjust as needed. Currently diet controlled. Continue good lifestyle modifications      Relevant Medications   benazepril (LOTENSIN) 40 MG tablet   Other Relevant Orders   Lipid Panel w/o Chol/HDL Ratio out     Respiratory   Allergic rhinitis     Digestive   Acid reflux    Stable and under good control, continue current regimen        Other   Hyperlipidemia    Recheck labs, adjust as needed. Continue good lifestyle habits      Relevant Medications   benazepril (LOTENSIN) 40 MG tablet   Other Relevant Orders   Lipid Panel w/o Chol/HDL Ratio out    Other Visit Diagnoses    Arthralgia, unspecified joint       general arthritis aches here and there, can try diclofenac gel, OTC pain relievers prn. Stay active and healthy body weight   Smell disturbance       Intermittent chemical odor the past 3 months. Continue to monitor, keep allergies under good control       Follow up plan: Return in about 6 months (around 12/24/2019) for CPE.

## 2019-06-26 NOTE — Assessment & Plan Note (Signed)
BPs stable and under good control. Continue current regimen 

## 2019-06-26 NOTE — Assessment & Plan Note (Signed)
Recheck lipids, adjust as needed. Currently diet controlled. Continue good lifestyle modifications

## 2019-06-26 NOTE — Assessment & Plan Note (Signed)
Recheck labs, adjust as needed. Continue good lifestyle habits

## 2019-06-27 ENCOUNTER — Other Ambulatory Visit: Payer: Self-pay

## 2019-06-27 ENCOUNTER — Other Ambulatory Visit: Payer: Commercial Managed Care - PPO

## 2019-06-27 NOTE — Addendum Note (Signed)
Addended by: Crissie Figures R on: 06/27/2019 10:12 AM   Modules accepted: Orders

## 2019-06-28 LAB — LIPID PANEL W/O CHOL/HDL RATIO
Cholesterol, Total: 205 mg/dL — ABNORMAL HIGH (ref 100–199)
HDL: 46 mg/dL (ref >39)
LDL Chol Calc (NIH): 139 mg/dL — ABNORMAL HIGH (ref 0–99)
Triglycerides: 112 mg/dL (ref 0–149)
VLDL Cholesterol Cal: 20 mg/dL (ref 5–40)

## 2019-06-28 LAB — COMPREHENSIVE METABOLIC PANEL
ALT: 25 IU/L (ref 0–44)
AST: 17 IU/L (ref 0–40)
Albumin/Globulin Ratio: 2.5 — ABNORMAL HIGH (ref 1.2–2.2)
Albumin: 4.8 g/dL (ref 3.8–4.8)
Alkaline Phosphatase: 87 IU/L (ref 39–117)
BUN/Creatinine Ratio: 20 (ref 10–24)
BUN: 19 mg/dL (ref 8–27)
Bilirubin Total: 0.6 mg/dL (ref 0.0–1.2)
CO2: 25 mmol/L (ref 20–29)
Calcium: 9.7 mg/dL (ref 8.6–10.2)
Chloride: 101 mmol/L (ref 96–106)
Creatinine, Ser: 0.94 mg/dL (ref 0.76–1.27)
GFR calc Af Amer: 99 mL/min/{1.73_m2} (ref >59)
GFR calc non Af Amer: 85 mL/min/{1.73_m2} (ref >59)
Globulin, Total: 1.9 g/dL (ref 1.5–4.5)
Glucose: 76 mg/dL (ref 65–99)
Potassium: 4.2 mmol/L (ref 3.5–5.2)
Sodium: 140 mmol/L (ref 134–144)
Total Protein: 6.7 g/dL (ref 6.0–8.5)

## 2019-06-28 NOTE — Progress Notes (Signed)
Called pt to schedule cpe, no answer, left vm 

## 2019-08-26 DIAGNOSIS — R69 Illness, unspecified: Secondary | ICD-10-CM | POA: Diagnosis not present

## 2019-11-07 DIAGNOSIS — K219 Gastro-esophageal reflux disease without esophagitis: Secondary | ICD-10-CM | POA: Diagnosis not present

## 2019-11-07 DIAGNOSIS — Z880 Allergy status to penicillin: Secondary | ICD-10-CM | POA: Diagnosis not present

## 2019-11-07 DIAGNOSIS — Z85828 Personal history of other malignant neoplasm of skin: Secondary | ICD-10-CM | POA: Diagnosis not present

## 2019-11-07 DIAGNOSIS — Z803 Family history of malignant neoplasm of breast: Secondary | ICD-10-CM | POA: Diagnosis not present

## 2019-11-07 DIAGNOSIS — Z809 Family history of malignant neoplasm, unspecified: Secondary | ICD-10-CM | POA: Diagnosis not present

## 2019-11-07 DIAGNOSIS — J309 Allergic rhinitis, unspecified: Secondary | ICD-10-CM | POA: Diagnosis not present

## 2019-11-07 DIAGNOSIS — I10 Essential (primary) hypertension: Secondary | ICD-10-CM | POA: Diagnosis not present

## 2019-11-07 DIAGNOSIS — Z79899 Other long term (current) drug therapy: Secondary | ICD-10-CM | POA: Diagnosis not present

## 2019-11-07 DIAGNOSIS — G4730 Sleep apnea, unspecified: Secondary | ICD-10-CM | POA: Diagnosis not present

## 2019-11-07 DIAGNOSIS — R69 Illness, unspecified: Secondary | ICD-10-CM | POA: Diagnosis not present

## 2019-12-06 ENCOUNTER — Encounter: Payer: Self-pay | Admitting: Family Medicine

## 2019-12-18 DIAGNOSIS — I700 Atherosclerosis of aorta: Secondary | ICD-10-CM | POA: Diagnosis not present

## 2019-12-18 DIAGNOSIS — E7801 Familial hypercholesterolemia: Secondary | ICD-10-CM | POA: Diagnosis not present

## 2019-12-18 DIAGNOSIS — I480 Paroxysmal atrial fibrillation: Secondary | ICD-10-CM | POA: Diagnosis not present

## 2019-12-18 DIAGNOSIS — G4733 Obstructive sleep apnea (adult) (pediatric): Secondary | ICD-10-CM | POA: Diagnosis not present

## 2019-12-18 DIAGNOSIS — I10 Essential (primary) hypertension: Secondary | ICD-10-CM | POA: Diagnosis not present

## 2019-12-25 ENCOUNTER — Other Ambulatory Visit: Payer: Self-pay

## 2019-12-25 ENCOUNTER — Ambulatory Visit (INDEPENDENT_AMBULATORY_CARE_PROVIDER_SITE_OTHER): Payer: Medicare HMO | Admitting: Family Medicine

## 2019-12-25 ENCOUNTER — Encounter: Payer: Self-pay | Admitting: Family Medicine

## 2019-12-25 VITALS — BP 119/86 | HR 70 | Temp 97.5°F | Ht 74.0 in | Wt 245.0 lb

## 2019-12-25 DIAGNOSIS — E291 Testicular hypofunction: Secondary | ICD-10-CM

## 2019-12-25 DIAGNOSIS — K219 Gastro-esophageal reflux disease without esophagitis: Secondary | ICD-10-CM | POA: Diagnosis not present

## 2019-12-25 DIAGNOSIS — Z23 Encounter for immunization: Secondary | ICD-10-CM

## 2019-12-25 DIAGNOSIS — I700 Atherosclerosis of aorta: Secondary | ICD-10-CM

## 2019-12-25 DIAGNOSIS — E785 Hyperlipidemia, unspecified: Secondary | ICD-10-CM | POA: Diagnosis not present

## 2019-12-25 DIAGNOSIS — Z0000 Encounter for general adult medical examination without abnormal findings: Secondary | ICD-10-CM | POA: Diagnosis not present

## 2019-12-25 DIAGNOSIS — J309 Allergic rhinitis, unspecified: Secondary | ICD-10-CM

## 2019-12-25 DIAGNOSIS — G4733 Obstructive sleep apnea (adult) (pediatric): Secondary | ICD-10-CM | POA: Diagnosis not present

## 2019-12-25 DIAGNOSIS — I10 Essential (primary) hypertension: Secondary | ICD-10-CM

## 2019-12-25 DIAGNOSIS — Z125 Encounter for screening for malignant neoplasm of prostate: Secondary | ICD-10-CM | POA: Diagnosis not present

## 2019-12-25 LAB — UA/M W/RFLX CULTURE, ROUTINE
Bilirubin, UA: NEGATIVE
Glucose, UA: NEGATIVE
Ketones, UA: NEGATIVE
Leukocytes,UA: NEGATIVE
Nitrite, UA: NEGATIVE
Protein,UA: NEGATIVE
RBC, UA: NEGATIVE
Specific Gravity, UA: 1.02 (ref 1.005–1.030)
Urobilinogen, Ur: 1 mg/dL (ref 0.2–1.0)
pH, UA: 6.5 (ref 5.0–7.5)

## 2019-12-25 NOTE — Assessment & Plan Note (Signed)
Stable and well controlled, continue current regimen 

## 2019-12-25 NOTE — Assessment & Plan Note (Signed)
Recheck lipids, adjust as needed. Continue good lifestyle habits for control

## 2019-12-25 NOTE — Assessment & Plan Note (Signed)
Diet controlled, recheck lipids and adjust if needed

## 2019-12-25 NOTE — Assessment & Plan Note (Signed)
On CPAP daily, overall sleeping well. Continue current regimen and sleep hygiene

## 2019-12-25 NOTE — Assessment & Plan Note (Signed)
BPs stable and WNL, continue current regimen 

## 2019-12-25 NOTE — Progress Notes (Signed)
BP 119/86    Pulse 70    Temp (!) 97.5 F (36.4 C) (Oral)    Ht 6\' 2"  (1.88 m)    Wt 245 lb (111.1 kg)    SpO2 98%    BMI 31.46 kg/m    Subjective:    Patient ID: Thomas Mercado, male    DOB: September 24, 1955, 64 y.o.   MRN: [id number]  HPI: Thomas Mercado is a 64 y.o. male presenting on 12/25/2019 for comprehensive medical examination. Current medical complaints include:see below  HTN - home BPs stable on benazepril, around 120/80 when checked. Denies CP, SOB, HAs, dizziness. No side effects with medication.   Aortic atherosclerosis, HLD - diet controlled, stays very active and eats healthy diet. Denies CP, SOB, claudication.   OSA on CPAP. Notes he's having trouble staying asleep some nights, unsure if related to machine/comfort of it. Has a follow up with sleep specialist to discuss his CPAP settings coming up soon.   He currently lives with: Interim Problems from his last visit: no  Depression Screen done today and results listed below:  Depression screen [facility] 2/9 12/25/2019 12/12/2018 05/30/2018 03/28/2017 09/21/2016  Decreased Interest 1 0 0 0 0  Down, Depressed, Hopeless 1 0 0 0 0  PHQ - 2 Score 2 0 0 0 0  Altered sleeping (No Data) 2 - - -  Tired, decreased energy 0 0 - - -  Change in appetite 0 0 - - -  Feeling bad or failure about yourself  0 0 - - -  Trouble concentrating 0 0 - - -  Moving slowly or fidgety/restless 0 0 - - -  Suicidal thoughts 0 0 - - -  PHQ-9 Score - 2 - - -    The patient does not have a history of falls. I did complete a risk assessment for falls. A plan of care for falls was documented.   Past Medical History:  Past Medical History:  Diagnosis Date   Allergy    Aortic atherosclerosis ([place]) 07/31/2017   Chest x-ray February 2019   Atrial fibrillation [facility]   Atrial fibrillation ([place])    CPAP (continuous positive airway pressure) dependence    Dysrhythmia    ED (erectile dysfunction)    GERD  (gastroesophageal reflux disease)    Heart murmur    Hyperlipidemia    Hypertension    Sleep apnea    Sleep apnea     Surgical History:  Past Surgical History:  Procedure Laterality Date   ABLATION  Sept 2014   heart   ABLATION  12/2015   heart   COLONOSCOPY WITH PROPOFOL N/A 10/23/2015   Procedure: COLONOSCOPY WITH PROPOFOL;  Surgeon: Lollie Sails, MD;  Location: [facility] ENDOSCOPY;  Service: Endoscopy;  Laterality: N/A;   KNEE ARTHROSCOPY Left 04/09/2015   Procedure: ,left knee arthroscopy, limited synovectomy and partial medial menisectomy.;  Surgeon: Thornton Park, MD;  Location: [facility] ORS;  Service: Orthopedics;  Laterality: Left;   VARICOCELE EXCISION      Medications:  Current Outpatient Medications on File Prior to Visit  Medication Sig   aspirin EC 81 MG tablet Take by mouth.   benazepril (LOTENSIN) 40 MG tablet Take 1 tablet (40 mg total) by mouth daily.   cetirizine (ZYRTEC) 10 MG tablet Take 1 tablet (10 mg total) by mouth daily.   esomeprazole (NEXIUM) 40 MG capsule TAKE 1 CAPSULE (40 MG TOTAL) BY MOUTH DAILY AT 12 NOON.  fluticasone (FLONASE) 50 MCG/ACT nasal spray PLACE 2 SPRAYS INTO BOTH NOSTRILS DAILY.   No current facility-administered medications on file prior to visit.    Allergies:  Allergies  Allergen Reactions   Penicillin G Benzathine Rash    As a child    Social History:  Social History   Socioeconomic History   Marital status: Married    Spouse name: Not on file   Number of children: Not on file   Years of education: Not on file   Highest education level: Not on file  Occupational History   Occupation: full time    Employer: [employer]  Tobacco Use   Smoking status: Never Smoker   Smokeless tobacco: Never Used  Vaping Use   Vaping Use: Never used  Substance and Sexual Activity   Alcohol use: Yes    Alcohol/week: 0.0 standard drinks    Comment: on occasion with beer or liquor   Drug use: No    Sexual activity: Yes  Other Topics Concern   Not on file  Social History Narrative   Not on file   Social Determinants of Health   Financial Resource Strain:    Difficulty of Paying Living Expenses:   Food Insecurity:    Worried About Charity fundraiser in the Last Year:    Arboriculturist in the Last Year:   Transportation Needs:    Film/video editor (Medical):    Lack of Transportation (Non-Medical):   Physical Activity:    Days of Exercise per Week:    Minutes of Exercise per Session:   Stress:    Feeling of Stress :   Social Connections:    Frequency of Communication with Friends and Family:    Frequency of Social Gatherings with Friends and Family:    Attends Religious Services:    Active Member of Clubs or Organizations:    Attends Music therapist:    Marital Status:   Intimate Partner Violence:    Fear of Current or Ex-Partner:    Emotionally Abused:    Physically Abused:    Sexually Abused:    Social History   Tobacco Use  Smoking Status Never Smoker  Smokeless Tobacco Never Used   Social History   Substance and Sexual Activity  Alcohol Use Yes   Alcohol/week: 0.0 standard drinks   Comment: on occasion with beer or liquor    Family History:  Family History  Problem Relation Age of Onset   Breast cancer Mother    Alzheimer's disease Father    Hypertension Father    Arthritis Father    Heart disease Brother    Emphysema Brother     Past medical history, surgical history, medications, allergies, family history and social history reviewed with patient today and changes made to appropriate areas of the chart.   Review of Systems - General ROS: negative Psychological ROS: positive for - sleep disturbances Ophthalmic ROS: negative ENT ROS: negative Allergy and Immunology ROS: negative Hematological and Lymphatic ROS: negative Endocrine ROS: negative Breast ROS: negative for breast lumps Respiratory  ROS: no cough, shortness of breath, or wheezing Cardiovascular ROS: no chest pain or dyspnea on exertion Gastrointestinal ROS: no abdominal pain, change in bowel habits, or black or bloody stools Genito-Urinary ROS: no dysuria, trouble voiding, or hematuria Musculoskeletal ROS: negative Neurological ROS: no TIA or stroke symptoms Dermatological ROS: negative All other ROS negative except what is listed above and in the HPI.  Objective:    BP 119/86    Pulse 70    Temp (!) 97.5 F (36.4 C) (Oral)    Ht 6\' 2"  (1.88 m)    Wt 245 lb (111.1 kg)    SpO2 98%    BMI 31.46 kg/m   Wt Readings from Last 3 Encounters:  12/25/19 245 lb (111.1 kg)  06/26/19 232 lb (105.2 kg)  12/12/18 222 lb (100.7 kg)    Physical Exam Vitals and nursing note reviewed.  Constitutional:      General: He is not in acute distress.    Appearance: He is well-developed.  HENT:     Head: Atraumatic.     Right Ear: Tympanic membrane and external ear normal.     Left Ear: Tympanic membrane and external ear normal.     Nose: Nose normal.     Mouth/Throat:     Mouth: Mucous membranes are moist.     Pharynx: Oropharynx is clear.  Eyes:     General: No scleral icterus.    Conjunctiva/sclera: Conjunctivae normal.     Pupils: Pupils are equal, round, and reactive to light.  Cardiovascular:     Rate and Rhythm: Normal rate and regular rhythm.     Heart sounds: Normal heart sounds. No murmur heard.   Pulmonary:     Effort: Pulmonary effort is normal. No respiratory distress.     Breath sounds: Normal breath sounds.  Abdominal:     General: Bowel sounds are normal. There is no distension.     Palpations: Abdomen is soft. There is no mass.     Tenderness: There is no abdominal tenderness. There is no guarding.  Genitourinary:    Comments: GU exam declined Musculoskeletal:        General: No tenderness. Normal range of motion.     Cervical back: Normal range of motion and neck supple.  Skin:    General: Skin  is warm and dry.     Findings: No rash.  Neurological:     General: No focal deficit present.     Mental Status: He is alert and oriented to person, place, and time.     Deep Tendon Reflexes: Reflexes are normal and symmetric.  Psychiatric:        Mood and Affect: Mood normal.        Behavior: Behavior normal.        Thought Content: Thought content normal.        Judgment: Judgment normal.     Results for orders placed or performed in visit on 06/26/19  Lipid Panel w/o Chol/HDL Ratio out  Result Value Ref Range   Cholesterol, Total 205 (H) 100 - 199 mg/dL   Triglycerides 112 0 - 149 mg/dL   HDL 46 >39 mg/dL   VLDL Cholesterol Cal 20 5 - 40 mg/dL   LDL Chol Calc (NIH) 139 (H) 0 - 99 mg/dL  Comprehensive metabolic panel  Result Value Ref Range   Glucose 76 65 - 99 mg/dL   BUN 19 8 - 27 mg/dL   Creatinine, Ser 0.94 0.76 - 1.27 mg/dL   GFR calc non Af Amer 85 >59 mL/min/1.73   GFR calc Af Amer 99 >59 mL/min/1.73   BUN/Creatinine Ratio 20 10 - 24   Sodium 140 134 - 144 mmol/L   Potassium 4.2 3.5 - 5.2 mmol/L   Chloride 101 96 - 106 mmol/L   CO2 25 20 - 29 mmol/L   Calcium 9.7 8.6 - 10.2 mg/dL  Total Protein 6.7 6.0 - 8.5 g/dL   Albumin 4.8 3.8 - 4.8 g/dL   Globulin, Total 1.9 1.5 - 4.5 g/dL   Albumin/Globulin Ratio 2.5 (H) 1.2 - 2.2   Bilirubin Total 0.6 0.0 - 1.2 mg/dL   Alkaline Phosphatase 87 39 - 117 IU/L   AST 17 0 - 40 IU/L   ALT 25 0 - 44 IU/L      Assessment & Plan:   Problem List Items Addressed This Visit      Cardiovascular and Mediastinum   Essential hypertension - Primary    BPs stable and WNL, continue current regimen      Relevant Orders   CBC with Differential/Platelet   Comprehensive metabolic panel   UA/M w/rflx Culture, Routine   Aortic atherosclerosis (HCC)    Recheck lipids, adjust as needed. Continue good lifestyle habits for control      Relevant Orders   Lipid Panel w/o Chol/HDL Ratio     Respiratory   Obstructive apnea    On  CPAP daily, overall sleeping well. Continue current regimen and sleep hygiene      Allergic rhinitis    Stable and well controlled, continue current regimen        Digestive   Acid reflux    Stable and well controlled, continue PPI therapy        Endocrine   Hypogonadism in male    Stable, no new sxs, continue to monitor      Relevant Orders   PSA     Other   Hyperlipidemia    Diet controlled, recheck lipids and adjust if needed       Other Visit Diagnoses    Annual physical exam       Screening for prostate cancer       Relevant Orders   PSA   Need for pneumococcal vaccine       Relevant Orders   Pneumococcal conjugate vaccine 13-valent IM (Completed)       Discussed aspirin prophylaxis for myocardial infarction prevention and decision was made to continue ASA  LABORATORY TESTING:  Health maintenance labs ordered today as discussed above.   The natural history of prostate cancer and ongoing controversy regarding screening and potential treatment outcomes of prostate cancer has been discussed with the patient. The meaning of a false positive PSA and a false negative PSA has been discussed. He indicates understanding of the limitations of this screening test and wishes to proceed with screening PSA testing.   IMMUNIZATIONS:   - Tdap: Tetanus vaccination status reviewed: last tetanus booster within 10 years. - Influenza: Up to date - Pneumovax: Not applicable - Prevnar: Administered today - Zostavax vaccine: Up to date  SCREENING: - Colonoscopy: Up to date  Discussed with patient purpose of the colonoscopy is to detect colon cancer at curable precancerous or early stages   PATIENT COUNSELING:    Sexuality: Discussed sexually transmitted diseases, partner selection, use of condoms, avoidance of unintended pregnancy  and contraceptive alternatives.   Advised to avoid cigarette smoking.  I discussed with the patient that most people either abstain from  alcohol or drink within safe limits (<=14/week and <=4 drinks/occasion for males, <=7/weeks and <= 3 drinks/occasion for females) and that the risk for alcohol disorders and other health effects rises proportionally with the number of drinks per week and how often a drinker exceeds daily limits.  Discussed cessation/primary prevention of drug use and availability of treatment for abuse.   Diet: Encouraged to  adjust caloric intake to maintain  or achieve ideal body weight, to reduce intake of dietary saturated fat and total fat, to limit sodium intake by avoiding high sodium foods and not adding table salt, and to maintain adequate dietary potassium and calcium preferably from fresh fruits, vegetables, and low-fat dairy products.    stressed the importance of regular exercise  Injury prevention: Discussed safety belts, safety helmets, smoke detector, smoking near bedding or upholstery.   Dental health: Discussed importance of regular tooth brushing, flossing, and dental visits.   Follow up plan: NEXT PREVENTATIVE PHYSICAL DUE IN 1 YEAR. Return in about 6 months (around 06/26/2020) for 6 month f/u.

## 2019-12-25 NOTE — Assessment & Plan Note (Signed)
Stable, no new sxs, continue to monitor

## 2019-12-25 NOTE — Assessment & Plan Note (Signed)
Stable and well controlled, continue PPI therapy

## 2019-12-26 LAB — LIPID PANEL W/O CHOL/HDL RATIO
Cholesterol, Total: 200 mg/dL — ABNORMAL HIGH (ref 100–199)
HDL: 41 mg/dL (ref >39)
LDL Chol Calc (NIH): 136 mg/dL — ABNORMAL HIGH (ref 0–99)
Triglycerides: 128 mg/dL (ref 0–149)
VLDL Cholesterol Cal: 23 mg/dL (ref 5–40)

## 2019-12-26 LAB — COMPREHENSIVE METABOLIC PANEL
ALT: 17 IU/L (ref 0–44)
AST: 13 IU/L (ref 0–40)
Albumin/Globulin Ratio: 2.4 — ABNORMAL HIGH (ref 1.2–2.2)
Albumin: 4.8 g/dL (ref 3.8–4.8)
Alkaline Phosphatase: 81 IU/L (ref 48–121)
BUN/Creatinine Ratio: 20 (ref 10–24)
BUN: 20 mg/dL (ref 8–27)
Bilirubin Total: 0.5 mg/dL (ref 0.0–1.2)
CO2: 26 mmol/L (ref 20–29)
Calcium: 9.8 mg/dL (ref 8.6–10.2)
Chloride: 103 mmol/L (ref 96–106)
Creatinine, Ser: 1 mg/dL (ref 0.76–1.27)
GFR calc Af Amer: 91 mL/min/{1.73_m2} (ref >59)
GFR calc non Af Amer: 79 mL/min/{1.73_m2} (ref >59)
Globulin, Total: 2 g/dL (ref 1.5–4.5)
Glucose: 93 mg/dL (ref 65–99)
Potassium: 4.9 mmol/L (ref 3.5–5.2)
Sodium: 140 mmol/L (ref 134–144)
Total Protein: 6.8 g/dL (ref 6.0–8.5)

## 2019-12-26 LAB — CBC WITH DIFFERENTIAL/PLATELET
Basophils Absolute: 0.1 10*3/uL (ref 0.0–0.2)
Basos: 1 %
EOS (ABSOLUTE): 0.2 10*3/uL (ref 0.0–0.4)
Eos: 4 %
Hematocrit: 43.7 % (ref 37.5–51.0)
Hemoglobin: 14.7 g/dL (ref 13.0–17.7)
Immature Grans (Abs): 0 10*3/uL (ref 0.0–0.1)
Immature Granulocytes: 0 %
Lymphocytes Absolute: 1.6 10*3/uL (ref 0.7–3.1)
Lymphs: 34 %
MCH: 29.2 pg (ref 26.6–33.0)
MCHC: 33.6 g/dL (ref 31.5–35.7)
MCV: 87 fL (ref 79–97)
Monocytes Absolute: 0.5 10*3/uL (ref 0.1–0.9)
Monocytes: 10 %
Neutrophils Absolute: 2.3 10*3/uL (ref 1.4–7.0)
Neutrophils: 51 %
Platelets: 165 10*3/uL (ref 150–450)
RBC: 5.03 x10E6/uL (ref 4.14–5.80)
RDW: 13 % (ref 11.6–15.4)
WBC: 4.6 10*3/uL (ref 3.4–10.8)

## 2019-12-26 LAB — PSA: Prostate Specific Ag, Serum: 1.3 ng/mL (ref 0.0–4.0)

## 2020-01-07 DIAGNOSIS — R69 Illness, unspecified: Secondary | ICD-10-CM | POA: Diagnosis not present

## 2020-01-09 DIAGNOSIS — E663 Overweight: Secondary | ICD-10-CM | POA: Diagnosis not present

## 2020-01-09 DIAGNOSIS — G4733 Obstructive sleep apnea (adult) (pediatric): Secondary | ICD-10-CM | POA: Diagnosis not present

## 2020-02-03 ENCOUNTER — Encounter: Payer: Self-pay | Admitting: Family Medicine

## 2020-03-10 ENCOUNTER — Other Ambulatory Visit: Payer: Self-pay

## 2020-03-10 DIAGNOSIS — K219 Gastro-esophageal reflux disease without esophagitis: Secondary | ICD-10-CM

## 2020-03-10 NOTE — Telephone Encounter (Signed)
Previous RL patient. Last seen 12/25/19 and has appointment 06/30/20

## 2020-03-11 ENCOUNTER — Other Ambulatory Visit: Payer: Self-pay

## 2020-03-11 ENCOUNTER — Encounter: Payer: Self-pay | Admitting: Unknown Physician Specialty

## 2020-03-11 ENCOUNTER — Ambulatory Visit (INDEPENDENT_AMBULATORY_CARE_PROVIDER_SITE_OTHER): Payer: Medicare HMO | Admitting: Unknown Physician Specialty

## 2020-03-11 VITALS — BP 116/75 | HR 77 | Temp 97.5°F | Resp 16 | Ht 75.0 in | Wt 241.0 lb

## 2020-03-11 DIAGNOSIS — M7522 Bicipital tendinitis, left shoulder: Secondary | ICD-10-CM | POA: Diagnosis not present

## 2020-03-11 DIAGNOSIS — Z23 Encounter for immunization: Secondary | ICD-10-CM

## 2020-03-11 MED ORDER — DICLOFENAC SODIUM 75 MG PO TBEC: 75.0000 mg | DELAYED_RELEASE_TABLET | Freq: Two times a day (BID) | ORAL | 0 refills | Status: DC

## 2020-03-11 NOTE — Progress Notes (Signed)
BP 116/75 (BP Location: Left Arm, Patient Position: Sitting, Cuff Size: Normal)   Pulse 77   Temp (!) 97.5 F (36.4 C) (Oral)   Resp 16   Ht 6\' 3"  (1.905 m)   Wt 241 lb (109.3 kg)   SpO2 96%   BMI 30.12 kg/m    Subjective:    Patient ID: Thomas Mercado, male    DOB: 04-Apr-1956, 64 y.o.   MRN: [id number]  HPI: Thomas Mercado is a 64 y.o. male  Chief Complaint  Patient presents with  . Hand Pain    onset 3 weeks --severe yesterday--left side   Hand Pain  Incident onset: 3-4 weeks ao when pushing a pontoon boat.  Gradually worsening.   The pain is present in the upper left arm. Quality: Stabbing pain at times such as when extending arm for tasks such as shoveling and left over aching pain. The pain does not radiate. The pain has been fluctuating since the incident. Exacerbated by: improves with holding arm  He has tried acetaminophen and NSAIDs for the symptoms. The treatment provided moderate relief.    Relevant past medical, surgical, family and social history reviewed and updated as indicated. Interim medical history since our last visit reviewed. Allergies and medications reviewed and updated.  Review of Systems  Constitutional: Negative.   HENT: Negative.   Respiratory: Negative.     Per HPI unless specifically indicated above     Objective:    BP 116/75 (BP Location: Left Arm, Patient Position: Sitting, Cuff Size: Normal)   Pulse 77   Temp (!) 97.5 F (36.4 C) (Oral)   Resp 16   Ht 6\' 3"  (1.905 m)   Wt 241 lb (109.3 kg)   SpO2 96%   BMI 30.12 kg/m   Wt Readings from Last 3 Encounters:  03/11/20 241 lb (109.3 kg)  12/25/19 245 lb (111.1 kg)  06/26/19 232 lb (105.2 kg)    Physical Exam Constitutional:      General: He is not in acute distress.    Appearance: Normal appearance. He is well-developed.  HENT:     Head: Normocephalic and atraumatic.  Eyes:     General: Lids are normal. No scleral icterus.       Right eye: No discharge.        Left eye: No  discharge.     Conjunctiva/sclera: Conjunctivae normal.  Cardiovascular:     Rate and Rhythm: Normal rate.  Pulmonary:     Effort: Pulmonary effort is normal.  Abdominal:     Palpations: There is no hepatomegaly or splenomegaly.  Musculoskeletal:        General: Normal range of motion.     Right shoulder: No swelling, deformity, effusion, laceration or crepitus. Normal range of motion. Normal strength. Normal pulse.     Left shoulder: Tenderness present. No swelling, deformity, effusion, laceration or crepitus. Normal range of motion. Normal strength. Normal pulse.     Comments: Negative Hawkins, Neer, Apley lift.  Positive Speed's test.    Skin:    Coloration: Skin is not pale.     Findings: No rash.  Neurological:     Mental Status: He is alert and oriented to person, place, and time.  Psychiatric:        Behavior: Behavior normal.        Thought Content: Thought content normal.        Judgment: Judgment normal.     Results for orders placed or performed in visit on 12/25/19  CBC with Differential/Platelet  Result Value Ref Range   WBC 4.6 3.4 - 10.8 x10E3/uL   RBC 5.03 4.14 - 5.80 x10E6/uL   Hemoglobin 14.7 13.0 - 17.7 g/dL   Hematocrit 43.7 37.5 - 51.0 %   MCV 87 79 - 97 fL   MCH 29.2 26.6 - 33.0 pg   MCHC 33.6 31 - 35 g/dL   RDW 13.0 11.6 - 15.4 %   Platelets 165 150 - 450 x10E3/uL   Neutrophils 51 Not Estab. %   Lymphs 34 Not Estab. %   Monocytes 10 Not Estab. %   Eos 4 Not Estab. %   Basos 1 Not Estab. %   Neutrophils Absolute 2.3 1 - 7 x10E3/uL   Lymphocytes Absolute 1.6 0 - 3 x10E3/uL   Monocytes Absolute 0.5 0 - 0 x10E3/uL   EOS (ABSOLUTE) 0.2 0.0 - 0.4 x10E3/uL   Basophils Absolute 0.1 0 - 0 x10E3/uL   Immature Granulocytes 0 Not Estab. %   Immature Grans (Abs) 0.0 0.0 - 0.1 x10E3/uL  Comprehensive metabolic panel  Result Value Ref Range   Glucose 93 65 - 99 mg/dL   BUN 20 8 - 27 mg/dL   Creatinine, Ser 1.00 0.76 - 1.27 mg/dL   GFR calc non Af Amer 79  >59 mL/min/1.73   GFR calc Af Amer 91 >59 mL/min/1.73   BUN/Creatinine Ratio 20 10 - 24   Sodium 140 134 - 144 mmol/L   Potassium 4.9 3.5 - 5.2 mmol/L   Chloride 103 96 - 106 mmol/L   CO2 26 20 - 29 mmol/L   Calcium 9.8 8.6 - 10.2 mg/dL   Total Protein 6.8 6.0 - 8.5 g/dL   Albumin 4.8 3.8 - 4.8 g/dL   Globulin, Total 2.0 1.5 - 4.5 g/dL   Albumin/Globulin Ratio 2.4 (H) 1.2 - 2.2   Bilirubin Total 0.5 0.0 - 1.2 mg/dL   Alkaline Phosphatase 81 48 - 121 IU/L   AST 13 0 - 40 IU/L   ALT 17 0 - 44 IU/L  Lipid Panel w/o Chol/HDL Ratio  Result Value Ref Range   Cholesterol, Total 200 (H) 100 - 199 mg/dL   Triglycerides 128 0 - 149 mg/dL   HDL 41 >39 mg/dL   VLDL Cholesterol Cal 23 5 - 40 mg/dL   LDL Chol Calc (NIH) 136 (H) 0 - 99 mg/dL  UA/M w/rflx Culture, Routine   Specimen: Urine   Urine  Result Value Ref Range   Specific Gravity, UA 1.020 1.005 - 1.030   pH, UA 6.5 5.0 - 7.5   Color, UA Yellow Yellow   Appearance Ur Clear Clear   Leukocytes,UA Negative Negative   Protein,UA Negative Negative/Trace   Glucose, UA Negative Negative   Ketones, UA Negative Negative   RBC, UA Negative Negative   Bilirubin, UA Negative Negative   Urobilinogen, Ur 1.0 0.2 - 1.0 mg/dL   Nitrite, UA Negative Negative  PSA  Result Value Ref Range   Prostate Specific Ag, Serum 1.3 0.0 - 4.0 ng/mL      Assessment & Plan:   Problem List Items Addressed This Visit    None    Visit Diagnoses    Biceps tendinitis of left upper extremity    -  Primary   Handout of stretching exercises given to gradually include strengthening.  Voltaren cream 3-4 times/day.  Pt ed on activity       Follow up plan: Return if symptoms worsen or fail to improve.

## 2020-03-11 NOTE — Addendum Note (Signed)
Addended by: Frederich Cha D on: 03/11/2020 09:27 AM   Modules accepted: Orders

## 2020-03-11 NOTE — Patient Instructions (Signed)
Printout of exercises.  Start with stretching and proceed to strengthening.  Consider PT evaluation

## 2020-03-12 ENCOUNTER — Other Ambulatory Visit: Payer: Self-pay

## 2020-03-12 DIAGNOSIS — K219 Gastro-esophageal reflux disease without esophagitis: Secondary | ICD-10-CM

## 2020-03-12 NOTE — Telephone Encounter (Signed)
Fax from [facility]

## 2020-03-15 MED ORDER — ESOMEPRAZOLE MAGNESIUM 40 MG PO CPDR: 40.0000 mg | DELAYED_RELEASE_CAPSULE | Freq: Every day | ORAL | 1 refills | Status: DC

## 2020-03-18 DIAGNOSIS — S0501XA Injury of conjunctiva and corneal abrasion without foreign body, right eye, initial encounter: Secondary | ICD-10-CM | POA: Diagnosis not present

## 2020-05-31 DIAGNOSIS — R140 Abdominal distension (gaseous): Secondary | ICD-10-CM | POA: Diagnosis not present

## 2020-05-31 DIAGNOSIS — R6883 Chills (without fever): Secondary | ICD-10-CM | POA: Diagnosis not present

## 2020-05-31 DIAGNOSIS — E785 Hyperlipidemia, unspecified: Secondary | ICD-10-CM | POA: Diagnosis not present

## 2020-05-31 DIAGNOSIS — I499 Cardiac arrhythmia, unspecified: Secondary | ICD-10-CM | POA: Diagnosis not present

## 2020-05-31 DIAGNOSIS — R7989 Other specified abnormal findings of blood chemistry: Secondary | ICD-10-CM | POA: Diagnosis not present

## 2020-05-31 DIAGNOSIS — I471 Supraventricular tachycardia: Secondary | ICD-10-CM | POA: Diagnosis not present

## 2020-05-31 DIAGNOSIS — R112 Nausea with vomiting, unspecified: Secondary | ICD-10-CM | POA: Diagnosis not present

## 2020-05-31 DIAGNOSIS — K529 Noninfective gastroenteritis and colitis, unspecified: Secondary | ICD-10-CM | POA: Diagnosis not present

## 2020-05-31 DIAGNOSIS — G4733 Obstructive sleep apnea (adult) (pediatric): Secondary | ICD-10-CM | POA: Diagnosis not present

## 2020-05-31 DIAGNOSIS — K6389 Other specified diseases of intestine: Secondary | ICD-10-CM | POA: Diagnosis not present

## 2020-05-31 DIAGNOSIS — I480 Paroxysmal atrial fibrillation: Secondary | ICD-10-CM | POA: Diagnosis not present

## 2020-05-31 DIAGNOSIS — R197 Diarrhea, unspecified: Secondary | ICD-10-CM | POA: Diagnosis not present

## 2020-05-31 DIAGNOSIS — R000 Tachycardia, unspecified: Secondary | ICD-10-CM | POA: Diagnosis not present

## 2020-05-31 DIAGNOSIS — I10 Essential (primary) hypertension: Secondary | ICD-10-CM | POA: Diagnosis not present

## 2020-05-31 DIAGNOSIS — K567 Ileus, unspecified: Secondary | ICD-10-CM | POA: Diagnosis not present

## 2020-05-31 DIAGNOSIS — Z20822 Contact with and (suspected) exposure to covid-19: Secondary | ICD-10-CM | POA: Diagnosis not present

## 2020-05-31 DIAGNOSIS — K2970 Gastritis, unspecified, without bleeding: Secondary | ICD-10-CM | POA: Diagnosis not present

## 2020-05-31 DIAGNOSIS — D72825 Bandemia: Secondary | ICD-10-CM | POA: Diagnosis not present

## 2020-05-31 DIAGNOSIS — R1084 Generalized abdominal pain: Secondary | ICD-10-CM | POA: Diagnosis not present

## 2020-05-31 DIAGNOSIS — Z79899 Other long term (current) drug therapy: Secondary | ICD-10-CM | POA: Diagnosis not present

## 2020-05-31 DIAGNOSIS — K3189 Other diseases of stomach and duodenum: Secondary | ICD-10-CM | POA: Diagnosis not present

## 2020-05-31 DIAGNOSIS — I959 Hypotension, unspecified: Secondary | ICD-10-CM | POA: Diagnosis not present

## 2020-06-02 DIAGNOSIS — R140 Abdominal distension (gaseous): Secondary | ICD-10-CM | POA: Diagnosis not present

## 2020-06-05 DIAGNOSIS — K6389 Other specified diseases of intestine: Secondary | ICD-10-CM | POA: Insufficient documentation

## 2020-06-30 ENCOUNTER — Telehealth (INDEPENDENT_AMBULATORY_CARE_PROVIDER_SITE_OTHER): Payer: Medicare HMO | Admitting: Family Medicine

## 2020-06-30 ENCOUNTER — Ambulatory Visit: Payer: Medicare HMO | Admitting: Family Medicine

## 2020-06-30 ENCOUNTER — Encounter: Payer: Self-pay | Admitting: Family Medicine

## 2020-06-30 ENCOUNTER — Other Ambulatory Visit: Payer: Self-pay

## 2020-06-30 VITALS — BP 131/74 | Wt 237.0 lb

## 2020-06-30 DIAGNOSIS — M7522 Bicipital tendinitis, left shoulder: Secondary | ICD-10-CM | POA: Diagnosis not present

## 2020-06-30 DIAGNOSIS — K6389 Other specified diseases of intestine: Secondary | ICD-10-CM

## 2020-06-30 DIAGNOSIS — I10 Essential (primary) hypertension: Secondary | ICD-10-CM | POA: Diagnosis not present

## 2020-06-30 MED ORDER — BENAZEPRIL HCL 40 MG PO TABS: 40.0000 mg | ORAL_TABLET | Freq: Every day | ORAL | 1 refills | Status: DC

## 2020-06-30 MED ORDER — DICLOFENAC SODIUM 75 MG PO TBEC
75.0000 mg | DELAYED_RELEASE_TABLET | Freq: Two times a day (BID) | ORAL | 0 refills | Status: DC
Start: 2020-06-30 — End: 2020-10-13

## 2020-06-30 MED ORDER — FLUTICASONE PROPIONATE 50 MCG/ACT NA SUSP: 2.0000 | Freq: Every day | NASAL | 12 refills | Status: AC

## 2020-06-30 MED ORDER — CETIRIZINE HCL 10 MG PO TABS
10.0000 mg | ORAL_TABLET | Freq: Every day | ORAL | 3 refills | Status: DC
Start: 2020-06-30 — End: 2021-01-11

## 2020-06-30 NOTE — Assessment & Plan Note (Signed)
Under good control on current regimen. Continue current regimen. Continue to monitor. Call with any concerns. Refills given. Will come in for labs.

## 2020-06-30 NOTE — Progress Notes (Signed)
BP 131/74   Wt 237 lb (107.5 kg)   BMI 29.62 kg/m    Subjective:    Patient ID: Thomas Mercado, male    DOB: [birth year], 65 y.o.   MRN: [id number]  HPI: Thomas Mercado is a 65 y.o. male  Chief Complaint  Patient presents with  . Hypertension    6 month f/up   HYPERTENSION Hypertension status: controlled  Satisfied with current treatment? yes Duration of hypertension: chronic BP monitoring frequency:  not checking BP medication side effects:  no Medication compliance: excellent compliance Previous BP meds: benazepril Aspirin: no Recurrent headaches: no Visual changes: no Palpitations: no Dyspnea: no Chest pain: no Lower extremity edema: no Dizzy/lightheaded: no  Bicipital tendonitis is still acting up when he lifts something. He has been doing his stretches and taking his diclofenac.   He was in the hospital for 5 days in December with intestinal pneumonitis of unknown cause. He is feeling well now with no pain. He is to see his GI doctor in 10 days.   Relevant past medical, surgical, family and social history reviewed and updated as indicated. Interim medical history since our last visit reviewed. Allergies and medications reviewed and updated.  Review of Systems  Constitutional: Negative.   Respiratory: Negative.   Cardiovascular: Negative.   Gastrointestinal: Negative.   Musculoskeletal: Positive for arthralgias and myalgias. Negative for back pain, gait problem, joint swelling, neck pain and neck stiffness.  Neurological: Negative.   Psychiatric/Behavioral: Negative.     Per HPI unless specifically indicated above     Objective:    BP 131/74   Wt 237 lb (107.5 kg)   BMI 29.62 kg/m   Wt Readings from Last 3 Encounters:  06/30/20 237 lb (107.5 kg)  03/11/20 241 lb (109.3 kg)  12/25/19 245 lb (111.1 kg)    Physical Exam Vitals and nursing note reviewed.  Constitutional:      General: He is not in acute distress.    Appearance: Normal appearance. He is  not ill-appearing, toxic-appearing or diaphoretic.  HENT:     Head: Normocephalic and atraumatic.     Right Ear: External ear normal.     Left Ear: External ear normal.     Nose: Nose normal.     Mouth/Throat:     Mouth: Mucous membranes are moist.     Pharynx: Oropharynx is clear.  Eyes:     General: No scleral icterus.       Right eye: No discharge.        Left eye: No discharge.     Conjunctiva/sclera: Conjunctivae normal.     Pupils: Pupils are equal, round, and reactive to light.  Pulmonary:     Effort: Pulmonary effort is normal. No respiratory distress.     Comments: Speaking in full sentences Musculoskeletal:        General: Normal range of motion.     Cervical back: Normal range of motion.  Skin:    Coloration: Skin is not jaundiced or pale.     Findings: No bruising, erythema, lesion or rash.  Neurological:     Mental Status: He is alert and oriented to person, place, and time. Mental status is at baseline.  Psychiatric:        Mood and Affect: Mood normal.        Behavior: Behavior normal.        Thought Content: Thought content normal.        Judgment: Judgment normal.     Results  for orders placed or performed in visit on 12/25/19  CBC with Differential/Platelet  Result Value Ref Range   WBC 4.6 3.4 - 10.8 x10E3/uL   RBC 5.03 4.14 - 5.80 x10E6/uL   Hemoglobin 14.7 13.0 - 17.7 g/dL   Hematocrit 43.7 37.5 - 51.0 %   MCV 87 79 - 97 fL   MCH 29.2 26.6 - 33.0 pg   MCHC 33.6 31.5 - 35.7 g/dL   RDW 13.0 11.6 - 15.4 %   Platelets 165 150 - 450 x10E3/uL   Neutrophils 51 Not Estab. %   Lymphs 34 Not Estab. %   Monocytes 10 Not Estab. %   Eos 4 Not Estab. %   Basos 1 Not Estab. %   Neutrophils Absolute 2.3 1.4 - 7.0 x10E3/uL   Lymphocytes Absolute 1.6 0.7 - 3.1 x10E3/uL   Monocytes Absolute 0.5 0.1 - 0.9 x10E3/uL   EOS (ABSOLUTE) 0.2 0.0 - 0.4 x10E3/uL   Basophils Absolute 0.1 0.0 - 0.2 x10E3/uL   Immature Granulocytes 0 Not Estab. %   Immature Grans (Abs)  0.0 0.0 - 0.1 x10E3/uL  Comprehensive metabolic panel  Result Value Ref Range   Glucose 93 65 - 99 mg/dL   BUN 20 8 - 27 mg/dL   Creatinine, Ser 1.00 0.76 - 1.27 mg/dL   GFR calc non Af Amer 79 >59 mL/min/1.73   GFR calc Af Amer 91 >59 mL/min/1.73   BUN/Creatinine Ratio 20 10 - 24   Sodium 140 134 - 144 mmol/L   Potassium 4.9 3.5 - 5.2 mmol/L   Chloride 103 96 - 106 mmol/L   CO2 26 20 - 29 mmol/L   Calcium 9.8 8.6 - 10.2 mg/dL   Total Protein 6.8 6.0 - 8.5 g/dL   Albumin 4.8 3.8 - 4.8 g/dL   Globulin, Total 2.0 1.5 - 4.5 g/dL   Albumin/Globulin Ratio 2.4 (H) 1.2 - 2.2   Bilirubin Total 0.5 0.0 - 1.2 mg/dL   Alkaline Phosphatase 81 48 - 121 IU/L   AST 13 0 - 40 IU/L   ALT 17 0 - 44 IU/L  Lipid Panel w/o Chol/HDL Ratio  Result Value Ref Range   Cholesterol, Total 200 (H) 100 - 199 mg/dL   Triglycerides 128 0 - 149 mg/dL   HDL 41 >39 mg/dL   VLDL Cholesterol Cal 23 5 - 40 mg/dL   LDL Chol Calc (NIH) 136 (H) 0 - 99 mg/dL  UA/M w/rflx Culture, Routine   Specimen: Urine   Urine  Result Value Ref Range   Specific Gravity, UA 1.020 1.005 - 1.030   pH, UA 6.5 5.0 - 7.5   Color, UA Yellow Yellow   Appearance Ur Clear Clear   Leukocytes,UA Negative Negative   Protein,UA Negative Negative/Trace   Glucose, UA Negative Negative   Ketones, UA Negative Negative   RBC, UA Negative Negative   Bilirubin, UA Negative Negative   Urobilinogen, Ur 1.0 0.2 - 1.0 mg/dL   Nitrite, UA Negative Negative  PSA  Result Value Ref Range   Prostate Specific Ag, Serum 1.3 0.0 - 4.0 ng/mL      Assessment & Plan:   Problem List Items Addressed This Visit      Cardiovascular and Mediastinum   Essential hypertension - Primary    Under good control on current regimen. Continue current regimen. Continue to monitor. Call with any concerns. Refills given. Will come in for labs.       Relevant Medications   benazepril (LOTENSIN) 40 MG tablet  Other Relevant Orders   Basic metabolic panel     Other Visit Diagnoses    Pneumatosis of intestines       Following with GI. Conitnue to monitor. Call with any concerns or with any abdominal pain.    Biceps tendinitis of left upper extremity       Offered referral to PT or ortho. He would like to hold on these at this time, but will call if he changes his mind and we will refer. Call with any concerns.        Follow up plan: Return in about 6 months (around 12/28/2020) for physical.   . This visit was completed via MyChart due to the restrictions of the COVID-19 pandemic. All issues as above were discussed and addressed. Physical exam was done as above through visual confirmation on MyChart. If it was felt that the patient should be evaluated in the office, they were directed there. The patient verbally consented to this visit. . Location of the patient: home . Location of the provider: home . Those involved with this call:  . Provider: Park Liter, DO . CMA: Yvonna Alanis, [place] . Front Desk/Registration: Jill Side  . Time spent on call: 25 minutes with patient face to face via video conference. More than 50% of this time was spent in counseling and coordination of care. 40 minutes total spent in review of patient's record and preparation of their chart.

## 2020-07-02 ENCOUNTER — Other Ambulatory Visit: Payer: Medicare HMO

## 2020-07-02 ENCOUNTER — Other Ambulatory Visit: Payer: Self-pay

## 2020-07-02 DIAGNOSIS — I10 Essential (primary) hypertension: Secondary | ICD-10-CM | POA: Diagnosis not present

## 2020-07-03 LAB — BASIC METABOLIC PANEL
BUN/Creatinine Ratio: 26 — ABNORMAL HIGH (ref 10–24)
BUN: 25 mg/dL (ref 8–27)
CO2: 21 mmol/L (ref 20–29)
Calcium: 9.8 mg/dL (ref 8.6–10.2)
Chloride: 104 mmol/L (ref 96–106)
Creatinine, Ser: 0.98 mg/dL (ref 0.76–1.27)
GFR calc Af Amer: 93 mL/min/{1.73_m2} (ref >59)
GFR calc non Af Amer: 81 mL/min/{1.73_m2} (ref >59)
Glucose: 103 mg/dL — ABNORMAL HIGH (ref 65–99)
Potassium: 4.3 mmol/L (ref 3.5–5.2)
Sodium: 142 mmol/L (ref 134–144)

## 2020-07-10 DIAGNOSIS — K6389 Other specified diseases of intestine: Secondary | ICD-10-CM | POA: Diagnosis not present

## 2020-07-10 DIAGNOSIS — K3189 Other diseases of stomach and duodenum: Secondary | ICD-10-CM | POA: Diagnosis not present

## 2020-07-10 DIAGNOSIS — R933 Abnormal findings on diagnostic imaging of other parts of digestive tract: Secondary | ICD-10-CM | POA: Diagnosis not present

## 2020-07-26 ENCOUNTER — Encounter: Payer: Self-pay | Admitting: Family Medicine

## 2020-07-27 ENCOUNTER — Other Ambulatory Visit: Payer: Self-pay | Admitting: Family Medicine

## 2020-07-27 MED ORDER — PANTOPRAZOLE SODIUM 40 MG PO TBEC: 40.0000 mg | DELAYED_RELEASE_TABLET | Freq: Two times a day (BID) | ORAL | 0 refills | Status: DC

## 2020-08-28 DIAGNOSIS — I4891 Unspecified atrial fibrillation: Secondary | ICD-10-CM | POA: Diagnosis not present

## 2020-08-28 DIAGNOSIS — E669 Obesity, unspecified: Secondary | ICD-10-CM | POA: Diagnosis not present

## 2020-08-28 DIAGNOSIS — Z803 Family history of malignant neoplasm of breast: Secondary | ICD-10-CM | POA: Diagnosis not present

## 2020-08-28 DIAGNOSIS — Z008 Encounter for other general examination: Secondary | ICD-10-CM | POA: Diagnosis not present

## 2020-08-28 DIAGNOSIS — K219 Gastro-esophageal reflux disease without esophagitis: Secondary | ICD-10-CM | POA: Diagnosis not present

## 2020-08-28 DIAGNOSIS — E785 Hyperlipidemia, unspecified: Secondary | ICD-10-CM | POA: Diagnosis not present

## 2020-08-28 DIAGNOSIS — Z6831 Body mass index (BMI) 31.0-31.9, adult: Secondary | ICD-10-CM | POA: Diagnosis not present

## 2020-08-28 DIAGNOSIS — I10 Essential (primary) hypertension: Secondary | ICD-10-CM | POA: Diagnosis not present

## 2020-08-28 DIAGNOSIS — Z7982 Long term (current) use of aspirin: Secondary | ICD-10-CM | POA: Diagnosis not present

## 2020-08-28 DIAGNOSIS — J309 Allergic rhinitis, unspecified: Secondary | ICD-10-CM | POA: Diagnosis not present

## 2020-08-28 DIAGNOSIS — G4733 Obstructive sleep apnea (adult) (pediatric): Secondary | ICD-10-CM | POA: Diagnosis not present

## 2020-09-29 DIAGNOSIS — R030 Elevated blood-pressure reading, without diagnosis of hypertension: Secondary | ICD-10-CM | POA: Diagnosis not present

## 2020-09-29 DIAGNOSIS — G4730 Sleep apnea, unspecified: Secondary | ICD-10-CM | POA: Diagnosis not present

## 2020-09-29 DIAGNOSIS — Z791 Long term (current) use of non-steroidal anti-inflammatories (NSAID): Secondary | ICD-10-CM | POA: Diagnosis not present

## 2020-09-29 DIAGNOSIS — Z7982 Long term (current) use of aspirin: Secondary | ICD-10-CM | POA: Diagnosis not present

## 2020-09-29 DIAGNOSIS — Z79899 Other long term (current) drug therapy: Secondary | ICD-10-CM | POA: Diagnosis not present

## 2020-09-29 DIAGNOSIS — K219 Gastro-esophageal reflux disease without esophagitis: Secondary | ICD-10-CM | POA: Diagnosis not present

## 2020-09-29 DIAGNOSIS — R933 Abnormal findings on diagnostic imaging of other parts of digestive tract: Secondary | ICD-10-CM | POA: Diagnosis not present

## 2020-09-29 DIAGNOSIS — D123 Benign neoplasm of transverse colon: Secondary | ICD-10-CM | POA: Diagnosis not present

## 2020-09-29 DIAGNOSIS — I4892 Unspecified atrial flutter: Secondary | ICD-10-CM | POA: Diagnosis not present

## 2020-09-29 DIAGNOSIS — E785 Hyperlipidemia, unspecified: Secondary | ICD-10-CM | POA: Diagnosis not present

## 2020-09-30 DIAGNOSIS — H2513 Age-related nuclear cataract, bilateral: Secondary | ICD-10-CM | POA: Diagnosis not present

## 2020-10-05 DIAGNOSIS — D1039 Benign neoplasm of other parts of mouth: Secondary | ICD-10-CM | POA: Diagnosis not present

## 2020-10-12 ENCOUNTER — Encounter: Payer: Self-pay | Admitting: Family Medicine

## 2020-10-13 MED ORDER — DICLOFENAC SODIUM 75 MG PO TBEC
75.0000 mg | DELAYED_RELEASE_TABLET | Freq: Two times a day (BID) | ORAL | 0 refills | Status: DC
Start: 2020-10-13 — End: 2021-01-05

## 2020-10-21 DIAGNOSIS — D485 Neoplasm of uncertain behavior of skin: Secondary | ICD-10-CM | POA: Diagnosis not present

## 2020-10-21 DIAGNOSIS — L578 Other skin changes due to chronic exposure to nonionizing radiation: Secondary | ICD-10-CM | POA: Diagnosis not present

## 2020-10-21 DIAGNOSIS — Z859 Personal history of malignant neoplasm, unspecified: Secondary | ICD-10-CM | POA: Diagnosis not present

## 2020-10-21 DIAGNOSIS — L570 Actinic keratosis: Secondary | ICD-10-CM | POA: Diagnosis not present

## 2020-10-21 DIAGNOSIS — C4442 Squamous cell carcinoma of skin of scalp and neck: Secondary | ICD-10-CM | POA: Diagnosis not present

## 2020-10-21 DIAGNOSIS — Z85828 Personal history of other malignant neoplasm of skin: Secondary | ICD-10-CM | POA: Diagnosis not present

## 2020-10-21 DIAGNOSIS — Z872 Personal history of diseases of the skin and subcutaneous tissue: Secondary | ICD-10-CM | POA: Diagnosis not present

## 2020-10-21 DIAGNOSIS — L814 Other melanin hyperpigmentation: Secondary | ICD-10-CM | POA: Diagnosis not present

## 2020-11-04 DIAGNOSIS — C4442 Squamous cell carcinoma of skin of scalp and neck: Secondary | ICD-10-CM | POA: Diagnosis not present

## 2020-11-23 MED ORDER — PANTOPRAZOLE SODIUM 40 MG PO TBEC: 40.0000 mg | DELAYED_RELEASE_TABLET | Freq: Two times a day (BID) | ORAL | 0 refills | Status: DC

## 2020-11-30 ENCOUNTER — Encounter: Payer: Self-pay | Admitting: Nurse Practitioner

## 2020-12-03 DIAGNOSIS — W57XXXA Bitten or stung by nonvenomous insect and other nonvenomous arthropods, initial encounter: Secondary | ICD-10-CM | POA: Diagnosis not present

## 2020-12-03 DIAGNOSIS — Z880 Allergy status to penicillin: Secondary | ICD-10-CM | POA: Diagnosis not present

## 2020-12-03 DIAGNOSIS — S60463A Insect bite (nonvenomous) of left middle finger, initial encounter: Secondary | ICD-10-CM | POA: Diagnosis not present

## 2020-12-03 DIAGNOSIS — S60562A Insect bite (nonvenomous) of left hand, initial encounter: Secondary | ICD-10-CM | POA: Diagnosis not present

## 2020-12-03 DIAGNOSIS — I10 Essential (primary) hypertension: Secondary | ICD-10-CM | POA: Diagnosis not present

## 2020-12-15 DIAGNOSIS — L570 Actinic keratosis: Secondary | ICD-10-CM | POA: Diagnosis not present

## 2020-12-23 ENCOUNTER — Ambulatory Visit (INDEPENDENT_AMBULATORY_CARE_PROVIDER_SITE_OTHER): Payer: Medicare HMO | Admitting: Nurse Practitioner

## 2020-12-23 ENCOUNTER — Encounter: Payer: Self-pay | Admitting: Nurse Practitioner

## 2020-12-23 ENCOUNTER — Other Ambulatory Visit: Payer: Self-pay

## 2020-12-23 VITALS — BP 114/76 | HR 84 | Temp 98.0°F

## 2020-12-23 DIAGNOSIS — W57XXXA Bitten or stung by nonvenomous insect and other nonvenomous arthropods, initial encounter: Secondary | ICD-10-CM

## 2020-12-23 DIAGNOSIS — S60562A Insect bite (nonvenomous) of left hand, initial encounter: Secondary | ICD-10-CM | POA: Diagnosis not present

## 2020-12-23 NOTE — Progress Notes (Signed)
BP 114/76   Pulse 84   Temp 98 F (36.7 C)   SpO2 97%    Subjective:    Patient ID: Thomas Mercado, male    DOB: 06/22/1955, 65 y.o.   MRN: [id number]  HPI: Thomas Mercado is a 65 y.o. male  Chief Complaint  Patient presents with   ER Follow Up    Patient was seen at [facility] in [place], treated with doxycycline for a tick bite    Patient was treated for a tick bite when he was in [place].  Patient had the tick while he was here in [place] but while he was on vacation his whole hand and arm started to swell and it was moving up his arm.  Patient was treated with Doxycycline which he completed.  No concerns at visit today.  Denies fatigue and joint aches.  Relevant past medical, surgical, family and social history reviewed and updated as indicated. Interim medical history since our last visit reviewed. Allergies and medications reviewed and updated.  Review of Systems  Constitutional:  Negative for fatigue.  Musculoskeletal:  Negative for arthralgias.  Skin:        Insect bite on ring finger of leg hand- resolving.   Per HPI unless specifically indicated above     Objective:    BP 114/76   Pulse 84   Temp 98 F (36.7 C)   SpO2 97%   Wt Readings from Last 3 Encounters:  06/30/20 237 lb (107.5 kg)  03/11/20 241 lb (109.3 kg)  12/25/19 245 lb (111.1 kg)    Physical Exam Vitals and nursing note reviewed.  Constitutional:      General: He is not in acute distress.    Appearance: Normal appearance. He is not ill-appearing, toxic-appearing or diaphoretic.  HENT:     Head: Normocephalic.     Right Ear: External ear normal.     Left Ear: External ear normal.     Nose: Nose normal. No congestion or rhinorrhea.     Mouth/Throat:     Mouth: Mucous membranes are moist.  Eyes:     General:        Right eye: No discharge.        Left eye: No discharge.     Extraocular Movements: Extraocular movements intact.     Conjunctiva/sclera: Conjunctivae normal.     Pupils: Pupils are equal,  round, and reactive to light.  Cardiovascular:     Rate and Rhythm: Normal rate and regular rhythm.     Heart sounds: No murmur heard. Pulmonary:     Effort: Pulmonary effort is normal. No respiratory distress.     Breath sounds: Normal breath sounds. No wheezing, rhonchi or rales.  Abdominal:     General: Abdomen is flat. Bowel sounds are normal.  Musculoskeletal:     Cervical back: Normal range of motion and neck supple.  Skin:    General: Skin is warm and dry.     Capillary Refill: Capillary refill takes less than 2 seconds.       Neurological:     General: No focal deficit present.     Mental Status: He is alert and oriented to person, place, and time.  Psychiatric:        Mood and Affect: Mood normal.        Behavior: Behavior normal.        Thought Content: Thought content normal.        Judgment: Judgment normal.    Results  for orders placed or performed in visit on 52/17/47  Basic metabolic panel  Result Value Ref Range   Glucose 103 (H) 65 - 99 mg/dL   BUN 25 8 - 27 mg/dL   Creatinine, Ser 0.98 0.76 - 1.27 mg/dL   GFR calc non Af Amer 81 >59 mL/min/1.73   GFR calc Af Amer 93 >59 mL/min/1.73   BUN/Creatinine Ratio 26 (H) 10 - 24   Sodium 142 134 - 144 mmol/L   Potassium 4.3 3.5 - 5.2 mmol/L   Chloride 104 96 - 106 mmol/L   CO2 21 20 - 29 mmol/L   Calcium 9.8 8.6 - 10.2 mg/dL      Assessment & Plan:   Problem List Items Addressed This Visit   None Visit Diagnoses     Insect bite of left hand, initial encounter    -  Primary   Patient already completed course of antibiotics. Return to clinic if symptoms worsen or fial to improve.         Follow up plan: Return if symptoms worsen or fail to improve.

## 2020-12-29 ENCOUNTER — Encounter: Payer: Medicare HMO | Admitting: Family Medicine

## 2021-01-01 NOTE — Progress Notes (Signed)
[facility] [place], [place] [address]  Pulmonary Sleep Medicine   Office Visit Note  Patient Name: Thomas Mercado DOB: Sep 23, 1955 MRN [id number]    Chief Complaint: Obstructive Sleep Apnea visit  Brief History:  Thomas Mercado is seen today for initial consult for annual follow up on CPAP@7cmH20 . The patient has a 12 year history of sleep apnea. Patient is using PAP nightly.  The patient feels rested after sleeping with PAP.  The patient reports benefiting from PAP use. Reported sleepiness is  improved and the Epworth Sleepiness Score is 7 out of 24. The patient doesn't  take naps. Patient reports he experiences dryness of the mouth but we will show him how to adjust the humidity. Patient goes to sleep at 11:30pm and wakes at 7-8am.The patient complains of the following: no complaints  The compliance download shows 92% compliance with an average use time of 6.9 hours. The AHI is 1.3  The patient does complain of limb movements disrupting sleep.  ROS  General: (-) fever, (-) chills, (-) night sweat Nose and Sinuses: (-) nasal stuffiness or itchiness, (-) postnasal drip, (-) nosebleeds, (-) sinus trouble. Mouth and Throat: (-) sore throat, (-) hoarseness. Neck: (-) swollen glands, (-) enlarged thyroid, (-) neck pain. Respiratory: - cough, - shortness of breath, - wheezing. Neurologic: - numbness, - tingling. Psychiatric: - anxiety, - depression   Current Medication: Outpatient Encounter Medications as of 01/05/2021  Medication Sig Note   aspirin EC 81 MG tablet Take 81 mg by mouth daily. 07/23/2015: Received from: [facility]   benazepril (LOTENSIN) 40 MG tablet Take 1 tablet (40 mg total) by mouth daily.    cetirizine (ZYRTEC) 10 MG tablet Take 1 tablet (10 mg total) by mouth daily.    fluticasone (FLONASE) 50 MCG/ACT nasal spray Place 2 sprays into both nostrils daily.    pantoprazole (PROTONIX) 40 MG tablet Take 1 tablet (40 mg total) by mouth in  the morning and at bedtime.    [DISCONTINUED] diclofenac (VOLTAREN) 75 MG EC tablet Take 1 tablet (75 mg total) by mouth 2 (two) times daily.    No facility-administered encounter medications on file as of 01/05/2021.    Surgical History: Past Surgical History:  Procedure Laterality Date   ABLATION  Sept 2014   heart   ABLATION  12/2015   heart   COLONOSCOPY WITH PROPOFOL N/A 10/23/2015   Procedure: COLONOSCOPY WITH PROPOFOL;  Surgeon: Lollie Sails, MD;  Location: [facility] ENDOSCOPY;  Service: Endoscopy;  Laterality: N/A;   KNEE ARTHROSCOPY Left 04/09/2015   Procedure: ,left knee arthroscopy, limited synovectomy and partial medial menisectomy.;  Surgeon: Thornton Park, MD;  Location: [facility] ORS;  Service: Orthopedics;  Laterality: Left;   VARICOCELE EXCISION      Medical History: Past Medical History:  Diagnosis Date   Allergy    Aortic atherosclerosis ([place]) 07/31/2017   Chest x-ray February 2019   Atrial fibrillation [facility]   Atrial fibrillation ([place])    CPAP (continuous positive airway pressure) dependence    Dysrhythmia    ED (erectile dysfunction)    GERD (gastroesophageal reflux disease)    Heart murmur    Hyperlipidemia    Hypertension    Sleep apnea    Sleep apnea     Family History: Non contributory to the present illness  Social History: Social History   Socioeconomic History   Marital status: Married    Spouse name: Not on file  Number of children: Not on file   Years of education: Not on file   Highest education level: Not on file  Occupational History   Occupation: full time    Employer: [employer]  Tobacco Use   Smoking status: Never   Smokeless tobacco: Never  Vaping Use   Vaping Use: Never used  Substance and Sexual Activity   Alcohol use: Yes    Alcohol/week: 0.0 standard drinks    Comment: on occasion with beer or liquor   Drug use: No   Sexual activity: Yes  Other Topics Concern   Not on  file  Social History Narrative   Not on file   Social Determinants of Health   Financial Resource Strain: Not on file  Food Insecurity: Not on file  Transportation Needs: Not on file  Physical Activity: Not on file  Stress: Not on file  Social Connections: Not on file  Intimate Partner Violence: Not on file    Vital Signs: Blood pressure 125/84, pulse 74, temperature 97.7 F (36.5 C), resp. rate 18, height 6\' 4"  (1.93 m), weight 248 lb (112.5 kg), SpO2 97 %.  Examination: General Appearance: The patient is well-developed, well-nourished, and in no distress. Neck Circumference: 42.5 cm Skin: Gross inspection of skin unremarkable. Head: normocephalic, no gross deformities. Eyes: no gross deformities noted. ENT: ears appear grossly normal Neurologic: Alert and oriented. No involuntary movements.    EPWORTH SLEEPINESS SCALE:  Scale:  (0)= no chance of dozing; (1)= slight chance of dozing; (2)= moderate chance of dozing; (3)= high chance of dozing  Chance  Situtation    Sitting and reading: 1    Watching TV: 1    Sitting Inactive in public: 0    As a passenger in car: 3      Lying down to rest: 0    Sitting and talking: 0    Sitting quielty after lunch: 2    In a car, stopped in traffic: 0   TOTAL SCORE:   7 out of 24    SLEEP STUDIES:  PSG 01/06/09 AHI 10 Spo78min 92%   CPAP COMPLIANCE DATA:  Date Range: 01/01/20-12/30/20  Average Daily Use: 6.7 hours  Median Use: 6.9  Compliance for > 4 Hours: 92%   AHI: 1.3 respiratory events per hour  Days Used: 351/365  Mask Leak: 14.6  95th Percentile Pressure: 7         LABS: No results found for this or any previous visit (from the past 2160 hour(s)).  Radiology: CT Chest Wo Contrast  Result Date: 08/28/2018 CLINICAL DATA:  Follow-up pulmonary nodule EXAM: CT CHEST WITHOUT CONTRAST TECHNIQUE: Multidetector CT imaging of the chest was performed following the standard protocol without IV  contrast. COMPARISON:  PET-CT dated 08/24/2017. CT chest dated 08/15/2017. [place] CTA chest dated 01/05/2016. FINDINGS: Cardiovascular: The heart is normal in size. No pericardial effusion. No evidence of thoracic aortic aneurysm. Atherosclerotic calcifications of the aortic arch. Mild coronary atherosclerosis of the LAD and left circumflex. Mediastinum/Nodes: No suspicious mediastinal lymphadenopathy. Visualized thyroid is unremarkable. Lungs/Pleura: 9 x 7 mm right middle lobe nodule (series 2/image 99), unchanged when compared to 2017, benign. No new/suspicious pulmonary nodules. No focal consolidation. No pleural effusion or pneumothorax. Upper Abdomen: Visualized upper abdomen is grossly unremarkable. Musculoskeletal: Visualized osseous structures are within normal limits. IMPRESSION: 8 mm (mean diameter) right middle lobe nodule, unchanged when compared to 2017, benign. Dedicated follow-up is not required per Fleischner Society guidelines. This recommendation follows the consensus  statement: Guidelines for Management of Small Pulmonary Nodules Detected on CT Images: From the Fleischner Society 2017; Radiology 2017; 284:228-243. Aortic Atherosclerosis (ICD10-I70.0). Electronically Signed   By: Julian Hy M.D.   On: 08/28/2018 12:14    No results found.  No results found.    Assessment and Plan: Patient Active Problem List   Diagnosis Date Noted   OSA on CPAP 01/05/2021   CPAP use counseling 01/05/2021   Obesity (BMI 30-39.9) 01/05/2021   Paroxysmal atrial fibrillation ([place]) 01/05/2021   Trigger finger of right hand 11/28/2017   Trigger finger of right hand 11/28/2017   Aortic atherosclerosis ([place]) 07/31/2017   Solitary pulmonary nodule 07/31/2017   Aortic atherosclerosis ([place]) 07/31/2017   Sprain of knee 06/05/2017   Essential hypertension 03/28/2017   Essential hypertension 03/28/2017   Infection of parotid gland 01/26/2016   Knee pain, left 04/06/2015   ED (erectile  dysfunction) 03/10/2015   Allergic rhinitis 03/10/2015   Hypogonadism in male 76/28/3151   Non-alcoholic fatty liver disease 03/10/2015   Allergic rhinitis 03/10/2015   Obstructive apnea 03/12/2013   Acid reflux 02/07/2013   Hyperlipidemia 02/07/2013   1. OSA on CPAP The patient does  tolerate PAP and reports definite benefit from PAP use. The patient was reminded how to clean equipment and advised to replace supplies routinely. The patient was also counselled on weight loss. The compliance is excellent . The AHI is 1.3.   OSA- continue excellent compliance, f/u one year.   2. CPAP use counseling CPAP Counseling: had a lengthy discussion with the patient regarding the importance of PAP therapy in management of the sleep apnea. Patient appears to understand the risk factor reduction and also understands the risks associated with untreated sleep apnea. Patient will try to make a good faith effort to remain compliant with therapy. Also instructed the patient on proper cleaning of the device including the water must be changed daily if possible and use of distilled water is preferred. Patient understands that the machine should be regularly cleaned with appropriate recommended cleaning solutions that do not damage the PAP machine for example given white vinegar and water rinses. Other methods such as ozone treatment may not be as good as these simple methods to achieve cleaning.   3. Essential hypertension Hypertension Counseling:   The following hypertensive lifestyle modification were recommended and discussed:  1. Limiting alcohol intake to less than 1 oz/day of ethanol:(24 oz of beer or 8 oz of wine or 2 oz of 100-proof whiskey). 2. Take baby ASA 81 mg daily. 3. Importance of regular aerobic exercise and losing weight. 4. Reduce dietary saturated fat and cholesterol intake for overall cardiovascular health. 5. Maintaining adequate dietary potassium, calcium, and magnesium intake. 6. Regular  monitoring of the blood pressure. 7. Reduce sodium intake to less than 100 mmol/day (less than 2.3 gm of sodium or less than 6 gm of sodium choride)    4. Obesity (BMI 30-39.9) Obesity Counseling: Had a lengthy discussion regarding patients BMI and weight issues. Patient was instructed on portion control as well as increased activity. Also discussed caloric restrictions with trying to maintain intake less than 2000 Kcal. Discussions were made in accordance with the 5As of weight management. Simple actions such as not eating late and if able to, taking a walk is suggested.     General Counseling: I have discussed the findings of the evaluation and examination with Thomas Mercado.  I have also discussed any further diagnostic evaluation thatmay be needed or ordered today.  Thomas Mercado verbalizes understanding of the findings of todays visit. We also reviewed his medications today and discussed drug interactions and side effects including but not limited excessive drowsiness and altered mental states. We also discussed that there is always a risk not just to him but also people around him. he has been encouraged to call the office with any questions or concerns that should arise related to todays visit.  No orders of the defined types were placed in this encounter.       I have personally obtained a history, examined the patient, evaluated laboratory and imaging results, formulated the assessment and plan and placed orders.   This patient was seen today by Tressie Ellis, PA-C in collaboration with Dr. Devona Konig.    Allyne Gee, MD [facility] Diplomate ABMS Pulmonary and Critical Care Medicine Sleep medicine

## 2021-01-05 ENCOUNTER — Ambulatory Visit (INDEPENDENT_AMBULATORY_CARE_PROVIDER_SITE_OTHER): Payer: Medicare HMO | Admitting: Internal Medicine

## 2021-01-05 VITALS — BP 125/84 | HR 74 | Temp 97.7°F | Resp 18 | Ht 76.0 in | Wt 248.0 lb

## 2021-01-05 DIAGNOSIS — E669 Obesity, unspecified: Secondary | ICD-10-CM | POA: Insufficient documentation

## 2021-01-05 DIAGNOSIS — G4733 Obstructive sleep apnea (adult) (pediatric): Secondary | ICD-10-CM | POA: Diagnosis not present

## 2021-01-05 DIAGNOSIS — Z9989 Dependence on other enabling machines and devices: Secondary | ICD-10-CM | POA: Diagnosis not present

## 2021-01-05 DIAGNOSIS — Z7189 Other specified counseling: Secondary | ICD-10-CM | POA: Insufficient documentation

## 2021-01-05 DIAGNOSIS — I10 Essential (primary) hypertension: Secondary | ICD-10-CM | POA: Diagnosis not present

## 2021-01-05 DIAGNOSIS — I480 Paroxysmal atrial fibrillation: Secondary | ICD-10-CM | POA: Insufficient documentation

## 2021-01-05 NOTE — Patient Instructions (Signed)

## 2021-01-06 NOTE — Progress Notes (Signed)
BP (!) 133/91   Pulse 65   Ht 6' 2.57" (1.894 m)   Wt 247 lb 4 oz (112.2 kg)   SpO2 98%   BMI 31.26 kg/m    Subjective:    Patient ID: Thomas Mercado, male    DOB: 1956/05/08, 65 y.o.   MRN: [id number]  HPI: Thomas Mercado is a 65 y.o. male presenting on 01/07/2021 for comprehensive medical examination. Current medical complaints include:none  He currently lives with: Interim Problems from his last visit:  left arm pain has worsend  HYPERTENSION / HYPERLIPIDEMIA Satisfied with current treatment? no Duration of hypertension: years BP monitoring frequency: a few times a month BP range: 120/80 BP medication side effects: no Past BP meds: benazepril Duration of hyperlipidemia: years Cholesterol medication side effects: no Cholesterol supplements: none Past cholesterol medications: none Medication compliance: excellent compliance Aspirin: yes Recent stressors: no Recurrent headaches: no Visual changes: no Palpitations: no Dyspnea: no Chest pain: no Lower extremity edema: no Dizzy/lightheaded: no  AFIB Patient states his rate has been controlled. Continues to follow up with Cardiology.  Denies concerns today.   LEFT ARM AND HAND PAIN Patient states he was seen in October for his left arm pain.  States it has improved some but seems to be worse now.  He states the voltaren gel and tabs do help but in the last few days the pain is worse.  Patient also states that he has noticed more pain in his hand since have the cellulitis.  Wondering if he needs to be checked for Lyme/RMSF.  Depression Screen done today and results listed below:  Depression screen [facility] 2/9 01/07/2021 03/11/2020 12/25/2019 12/12/2018 05/30/2018  Decreased Interest 0 0 1 0 0  Down, Depressed, Hopeless 0 0 1 0 0  PHQ - 2 Score 0 0 2 0 0  Altered sleeping - - (No Data) 2 -  Tired, decreased energy - - 0 0 -  Change in appetite - - 0 0 -  Feeling bad or failure about yourself  - - 0 0 -  Trouble concentrating - -  0 0 -  Moving slowly or fidgety/restless - - 0 0 -  Suicidal thoughts - - 0 0 -  PHQ-9 Score - - - 2 -    The patient does not have a history of falls. I did complete a risk assessment for falls. A plan of care for falls was documented.   Past Medical History:  Past Medical History:  Diagnosis Date   Allergy    Aortic atherosclerosis ([place]) 07/31/2017   Chest x-ray February 2019   [facility]rial fibrillation [facility]   Atrial fibrillation ([place])    CPAP (continuous positive airway pressure) dependence    Dysrhythmia    ED (erectile dysfunction)    GERD (gastroesophageal reflux disease)    Heart murmur    Hyperlipidemia    Hypertension    Sleep apnea    Sleep apnea     Surgical History:  Past Surgical History:  Procedure Laterality Date   ABLATION  Sept 2014   heart   ABLATION  12/2015   heart   COLONOSCOPY WITH PROPOFOL N/A 10/23/2015   Procedure: COLONOSCOPY WITH PROPOFOL;  Surgeon: Lollie Sails, MD;  Location: [facility] ENDOSCOPY;  Service: Endoscopy;  Laterality: N/A;   KNEE ARTHROSCOPY Left 04/09/2015   Procedure: ,left knee arthroscopy, limited synovectomy and partial medial menisectomy.;  Surgeon: Thornton Park, MD;  Location: [facility] ORS;  Service:  Orthopedics;  Laterality: Left;   VARICOCELE EXCISION      Medications:  Current Outpatient Medications on File Prior to Visit  Medication Sig   aspirin EC 81 MG tablet Take 81 mg by mouth daily.   cetirizine (ZYRTEC) 10 MG tablet Take 1 tablet (10 mg total) by mouth daily.   fluticasone (FLONASE) 50 MCG/ACT nasal spray Place 2 sprays into both nostrils daily.   No current facility-administered medications on file prior to visit.    Allergies:  Allergies  Allergen Reactions   Penicillin G Benzathine Rash    As a child    Social History:  Social History   Socioeconomic History   Marital status: Married    Spouse name: Not on file   Number of children: Not on file   Years of  education: Not on file   Highest education level: Not on file  Occupational History   Occupation: full time    Employer: [employer]  Tobacco Use   Smoking status: Never   Smokeless tobacco: Never  Vaping Use   Vaping Use: Never used  Substance and Sexual Activity   Alcohol use: Yes    Alcohol/week: 0.0 standard drinks    Comment: on occasion with beer or liquor   Drug use: No   Sexual activity: Yes  Other Topics Concern   Not on file  Social History Narrative   Not on file   Social Determinants of Health   Financial Resource Strain: Not on file  Food Insecurity: Not on file  Transportation Needs: Not on file  Physical Activity: Not on file  Stress: Not on file  Social Connections: Not on file  Intimate Partner Violence: Not on file   Social History   Tobacco Use  Smoking Status Never  Smokeless Tobacco Never   Social History   Substance and Sexual Activity  Alcohol Use Yes   Alcohol/week: 0.0 standard drinks   Comment: on occasion with beer or liquor    Family History:  Family History  Problem Relation Age of Onset   Breast cancer Mother    Alzheimer's disease Father    Hypertension Father    Arthritis Father    Heart disease Brother    Emphysema Brother     Past medical history, surgical history, medications, allergies, family history and social history reviewed with patient today and changes made to appropriate areas of the chart.   Review of Systems  Eyes:  Negative for blurred vision and double vision.  Respiratory:  Negative for shortness of breath.   Cardiovascular:  Negative for chest pain, palpitations and leg swelling.  Musculoskeletal:        Left arm and hand pain  Neurological:  Negative for dizziness and headaches.  All other ROS negative except what is listed above and in the HPI.      Objective:    BP (!) 133/91   Pulse 65   Ht 6' 2.57" (1.894 m)   Wt 247 lb 4 oz (112.2 kg)   SpO2 98%   BMI 31.26 kg/m   Wt Readings  from Last 3 Encounters:  01/07/21 247 lb 4 oz (112.2 kg)  01/05/21 248 lb (112.5 kg)  06/30/20 237 lb (107.5 kg)    Physical Exam Vitals and nursing note reviewed.  Constitutional:      General: He is not in acute distress.    Appearance: Normal appearance. He is not ill-appearing, toxic-appearing or diaphoretic.  HENT:     Head: Normocephalic.  Right Ear: Tympanic membrane, ear canal and external ear normal.     Left Ear: Tympanic membrane, ear canal and external ear normal.     Nose: Nose normal. No congestion or rhinorrhea.     Mouth/Throat:     Mouth: Mucous membranes are moist.  Eyes:     General:        Right eye: No discharge.        Left eye: No discharge.     Extraocular Movements: Extraocular movements intact.     Conjunctiva/sclera: Conjunctivae normal.     Pupils: Pupils are equal, round, and reactive to light.  Cardiovascular:     Rate and Rhythm: Normal rate and regular rhythm.     Heart sounds: No murmur heard. Pulmonary:     Effort: Pulmonary effort is normal. No respiratory distress.     Breath sounds: Normal breath sounds. No wheezing, rhonchi or rales.  Abdominal:     General: Abdomen is flat. Bowel sounds are normal. There is no distension.     Palpations: Abdomen is soft.     Tenderness: There is no abdominal tenderness. There is no guarding.  Musculoskeletal:        General: No swelling, tenderness, deformity or signs of injury. Normal range of motion.     Cervical back: Normal range of motion and neck supple.  Skin:    General: Skin is warm and dry.     Capillary Refill: Capillary refill takes less than 2 seconds.  Neurological:     General: No focal deficit present.     Mental Status: He is alert and oriented to person, place, and time.     Cranial Nerves: No cranial nerve deficit.     Motor: No weakness.     Deep Tendon Reflexes: Reflexes normal.  Psychiatric:        Mood and Affect: Mood normal.        Behavior: Behavior normal.         Thought Content: Thought content normal.        Judgment: Judgment normal.    Results for orders placed or performed in visit on 22/02/54  Basic metabolic panel  Result Value Ref Range   Glucose 103 (H) 65 - 99 mg/dL   BUN 25 8 - 27 mg/dL   Creatinine, Ser 0.98 0.76 - 1.27 mg/dL   GFR calc non Af Amer 81 >59 mL/min/1.73   GFR calc Af Amer 93 >59 mL/min/1.73   BUN/Creatinine Ratio 26 (H) 10 - 24   Sodium 142 134 - 144 mmol/L   Potassium 4.3 3.5 - 5.2 mmol/L   Chloride 104 96 - 106 mmol/L   CO2 21 20 - 29 mmol/L   Calcium 9.8 8.6 - 10.2 mg/dL      Assessment & Plan:   Problem List Items Addressed This Visit       Cardiovascular and Mediastinum   Essential hypertension    Chronic. Patient has been cutting Benzapril 40mg  in half. Will send 20mg  Benzapril to the pharmacy for patient. Discussed if blood pressure gets to 140s will need to increase dose back to 40mg .  Patient agrees and will continue to monitor at home.  Labs ordered today.         Relevant Medications   benazepril (LOTENSIN) 20 MG tablet   Other Relevant Orders   Comprehensive metabolic panel   Aortic atherosclerosis (HCC)    Chronic. Controlled.  On ASA daily. Will wait for lipid panel to return and  calculate updated ASCVD risk score. Will make recommendations based on risk score and blood work.          Relevant Medications   benazepril (LOTENSIN) 20 MG tablet   Other Relevant Orders   Lipid panel   CBC with Differential/Platelet   Paroxysmal atrial fibrillation (HCC)    Chronic.  Controlled.  No concerns at visit today. Continues to follow up with Cardiology.        Relevant Medications   benazepril (LOTENSIN) 20 MG tablet   Other Relevant Orders   CBC with Differential/Platelet     Other   Hyperlipidemia    Chronic.  Not currently on medication. Will wait for lipid panel to return and calculate updated ASCVD risk score. Will make recommendations based on risk score and blood work.  Follow up in 6  months.       Relevant Medications   benazepril (LOTENSIN) 20 MG tablet   Other Relevant Orders   Lipid panel   Other Visit Diagnoses     Annual physical exam    -  Primary   Health maintenance reviewed today. Up to date on vaccines. Labs ordered today.    Relevant Orders   TSH   PSA   Lipid panel   CBC with Differential/Platelet   Comprehensive metabolic panel   Urinalysis, Routine w reflex microscopic   Insect bite of left hand, sequela       Relevant Orders   Lyme Disease Serology w/Reflex   Rocky mtn spotted fvr abs pnl(IgG+IgM)   Biceps tendinitis of left upper extremity       Relevant Orders   Ambulatory referral to Physical Therapy        Discussed aspirin prophylaxis for myocardial infarction prevention and decision was made to continue ASA  LABORATORY TESTING:  Health maintenance labs ordered today as discussed above.   The natural history of prostate cancer and ongoing controversy regarding screening and potential treatment outcomes of prostate cancer has been discussed with the patient. The meaning of a false positive PSA and a false negative PSA has been discussed. He indicates understanding of the limitations of this screening test and wishes to proceed with screening PSA testing.   IMMUNIZATIONS:   - Tdap: Tetanus vaccination status reviewed: last tetanus booster within 10 years. - Influenza: Up to date - Pneumovax: Up to date - Prevnar:  - HPV: Not applicable - Zostavax vaccine: Up to date  SCREENING: - Colonoscopy: Up to date  Discussed with patient purpose of the colonoscopy is to detect colon cancer at curable precancerous or early stages   - AAA Screening: Not applicable  -Hearing Test: Not applicable  -Spirometry: Not applicable   PATIENT COUNSELING:    Sexuality: Discussed sexually transmitted diseases, partner selection, use of condoms, avoidance of unintended pregnancy  and contraceptive alternatives.   Advised to avoid cigarette  smoking.  I discussed with the patient that most people either abstain from alcohol or drink within safe limits (<=14/week and <=4 drinks/occasion for males, <=7/weeks and <= 3 drinks/occasion for females) and that the risk for alcohol disorders and other health effects rises proportionally with the number of drinks per week and how often a drinker exceeds daily limits.  Discussed cessation/primary prevention of drug use and availability of treatment for abuse.   Diet: Encouraged to adjust caloric intake to maintain  or achieve ideal body weight, to reduce intake of dietary saturated fat and total fat, to limit sodium intake by avoiding high sodium foods and  not adding table salt, and to maintain adequate dietary potassium and calcium preferably from fresh fruits, vegetables, and low-fat dairy products.    stressed the importance of regular exercise  Injury prevention: Discussed safety belts, safety helmets, smoke detector, smoking near bedding or upholstery.   Dental health: Discussed importance of regular tooth brushing, flossing, and dental visits.   Follow up plan: NEXT PREVENTATIVE PHYSICAL DUE IN 1 YEAR. Return in about 6 months (around 07/10/2021) for HTN, HLD, DM2 FU.

## 2021-01-07 ENCOUNTER — Encounter: Payer: Self-pay | Admitting: Nurse Practitioner

## 2021-01-07 ENCOUNTER — Ambulatory Visit (INDEPENDENT_AMBULATORY_CARE_PROVIDER_SITE_OTHER): Payer: Medicare HMO | Admitting: Nurse Practitioner

## 2021-01-07 ENCOUNTER — Other Ambulatory Visit: Payer: Self-pay

## 2021-01-07 VITALS — BP 133/91 | HR 65 | Ht 74.57 in | Wt 247.2 lb

## 2021-01-07 DIAGNOSIS — I480 Paroxysmal atrial fibrillation: Secondary | ICD-10-CM

## 2021-01-07 DIAGNOSIS — Z0000 Encounter for general adult medical examination without abnormal findings: Secondary | ICD-10-CM

## 2021-01-07 DIAGNOSIS — S60562S Insect bite (nonvenomous) of left hand, sequela: Secondary | ICD-10-CM

## 2021-01-07 DIAGNOSIS — M7522 Bicipital tendinitis, left shoulder: Secondary | ICD-10-CM

## 2021-01-07 DIAGNOSIS — I10 Essential (primary) hypertension: Secondary | ICD-10-CM

## 2021-01-07 DIAGNOSIS — E785 Hyperlipidemia, unspecified: Secondary | ICD-10-CM

## 2021-01-07 DIAGNOSIS — I700 Atherosclerosis of aorta: Secondary | ICD-10-CM

## 2021-01-07 DIAGNOSIS — W57XXXS Bitten or stung by nonvenomous insect and other nonvenomous arthropods, sequela: Secondary | ICD-10-CM | POA: Diagnosis not present

## 2021-01-07 LAB — URINALYSIS, ROUTINE W REFLEX MICROSCOPIC
Bilirubin, UA: NEGATIVE
Glucose, UA: NEGATIVE
Ketones, UA: NEGATIVE
Leukocytes,UA: NEGATIVE
Nitrite, UA: NEGATIVE
Protein,UA: NEGATIVE
RBC, UA: NEGATIVE
Specific Gravity, UA: >1.03 — ABNORMAL HIGH (ref 1.005–1.030)
Urobilinogen, Ur: 0.2 mg/dL (ref 0.2–1.0)
pH, UA: 5.5 (ref 5.0–7.5)

## 2021-01-07 MED ORDER — BENAZEPRIL HCL 20 MG PO TABS: 20.0000 mg | ORAL_TABLET | Freq: Every day | ORAL | 1 refills | Status: DC

## 2021-01-07 MED ORDER — PANTOPRAZOLE SODIUM 40 MG PO TBEC: 40.0000 mg | DELAYED_RELEASE_TABLET | Freq: Two times a day (BID) | ORAL | 0 refills | Status: DC

## 2021-01-07 MED ORDER — DICLOFENAC SODIUM 75 MG PO TBEC: 75.0000 mg | DELAYED_RELEASE_TABLET | Freq: Two times a day (BID) | ORAL | 1 refills | Status: DC | PRN

## 2021-01-07 NOTE — Assessment & Plan Note (Addendum)
Chronic. Controlled.  On ASA daily. Will wait for lipid panel to return and calculate updated ASCVD risk score. Will make recommendations based on risk score and blood work.

## 2021-01-07 NOTE — Assessment & Plan Note (Signed)
Chronic. Patient has been cutting Benzapril 40mg  in half. Will send 20mg  Benzapril to the pharmacy for patient. Discussed if blood pressure gets to 140s will need to increase dose back to 40mg .  Patient agrees and will continue to monitor at home.  Labs ordered today.

## 2021-01-07 NOTE — Assessment & Plan Note (Signed)
Chronic.  Controlled.  No concerns at visit today. Continues to follow up with Cardiology.

## 2021-01-07 NOTE — Assessment & Plan Note (Signed)
Chronic.  Not currently on medication. Will wait for lipid panel to return and calculate updated ASCVD risk score. Will make recommendations based on risk score and blood work.  Follow up in 6 months.

## 2021-01-07 NOTE — Progress Notes (Signed)
Hi Thomas Mercado. Your urinalysis looks good. We will continue to check it in the future. I will send you another message once your other labs come back.

## 2021-01-08 DIAGNOSIS — I10 Essential (primary) hypertension: Secondary | ICD-10-CM | POA: Diagnosis not present

## 2021-01-08 DIAGNOSIS — E7801 Familial hypercholesterolemia: Secondary | ICD-10-CM | POA: Diagnosis not present

## 2021-01-08 DIAGNOSIS — I480 Paroxysmal atrial fibrillation: Secondary | ICD-10-CM | POA: Diagnosis not present

## 2021-01-08 DIAGNOSIS — M25512 Pain in left shoulder: Secondary | ICD-10-CM | POA: Diagnosis not present

## 2021-01-08 LAB — LYME DISEASE SEROLOGY W/REFLEX: Lyme Total Antibody EIA: NEGATIVE

## 2021-01-08 NOTE — Progress Notes (Signed)
Hi Thomas Mercado. It was good to see you yesterday. Your lab work looks great. No evidence of Lyme disease. Thyroid, PSA, complete blood count, and liver, kidneys, and electrolytes are all normal. Your rocky mountain spotted fever isn't back yet but I will send you another message when it is.   You cholesterol is elevated and with your Aortic Atherosclerosis, I recommend you start Crestor '20mg'$  daily.  If you agree, I can send this to the pharmacy for you.    Please let me know if you have any questions.

## 2021-01-11 ENCOUNTER — Other Ambulatory Visit: Payer: Self-pay

## 2021-01-11 MED ORDER — CETIRIZINE HCL 10 MG PO TABS: 10.0000 mg | ORAL_TABLET | Freq: Every day | ORAL | 3 refills | Status: DC

## 2021-01-11 MED ORDER — ROSUVASTATIN CALCIUM 5 MG PO TABS: 5.0000 mg | ORAL_TABLET | Freq: Every day | ORAL | 0 refills | Status: DC

## 2021-01-14 LAB — ROCKY MTN SPOTTED FVR ABS PNL(IGG+IGM)
RMSF IgG: POSITIVE — AB
RMSF IgM: 0.86 index (ref 0.00–0.89)

## 2021-01-14 LAB — COMPREHENSIVE METABOLIC PANEL
ALT: 21 IU/L (ref 0–44)
AST: 16 IU/L (ref 0–40)
Albumin/Globulin Ratio: 2.4 — ABNORMAL HIGH (ref 1.2–2.2)
Albumin: 4.7 g/dL (ref 3.8–4.8)
Alkaline Phosphatase: 84 IU/L (ref 44–121)
BUN/Creatinine Ratio: 24 (ref 10–24)
BUN: 21 mg/dL (ref 8–27)
Bilirubin Total: 0.4 mg/dL (ref 0.0–1.2)
CO2: 24 mmol/L (ref 20–29)
Calcium: 9.8 mg/dL (ref 8.6–10.2)
Chloride: 103 mmol/L (ref 96–106)
Creatinine, Ser: 0.86 mg/dL (ref 0.76–1.27)
Globulin, Total: 2 g/dL (ref 1.5–4.5)
Glucose: 95 mg/dL (ref 65–99)
Potassium: 4.5 mmol/L (ref 3.5–5.2)
Sodium: 142 mmol/L (ref 134–144)
Total Protein: 6.7 g/dL (ref 6.0–8.5)
eGFR: 95 mL/min/{1.73_m2} (ref >59)

## 2021-01-14 LAB — CBC WITH DIFFERENTIAL/PLATELET
Basophils Absolute: 0.1 10*3/uL (ref 0.0–0.2)
Basos: 1 %
EOS (ABSOLUTE): 0.3 10*3/uL (ref 0.0–0.4)
Eos: 5 %
Hematocrit: 44.3 % (ref 37.5–51.0)
Hemoglobin: 14.8 g/dL (ref 13.0–17.7)
Immature Grans (Abs): 0 10*3/uL (ref 0.0–0.1)
Immature Granulocytes: 0 %
Lymphocytes Absolute: 1.8 10*3/uL (ref 0.7–3.1)
Lymphs: 32 %
MCH: 30 pg (ref 26.6–33.0)
MCHC: 33.4 g/dL (ref 31.5–35.7)
MCV: 90 fL (ref 79–97)
Monocytes Absolute: 0.6 10*3/uL (ref 0.1–0.9)
Monocytes: 10 %
Neutrophils Absolute: 2.9 10*3/uL (ref 1.4–7.0)
Neutrophils: 52 %
Platelets: 164 10*3/uL (ref 150–450)
RBC: 4.94 x10E6/uL (ref 4.14–5.80)
RDW: 13.1 % (ref 11.6–15.4)
WBC: 5.6 10*3/uL (ref 3.4–10.8)

## 2021-01-14 LAB — LIPID PANEL
Chol/HDL Ratio: 4.1 ratio (ref 0.0–5.0)
Cholesterol, Total: 212 mg/dL — ABNORMAL HIGH (ref 100–199)
HDL: 52 mg/dL (ref >39)
LDL Chol Calc (NIH): 139 mg/dL — ABNORMAL HIGH (ref 0–99)
Triglycerides: 116 mg/dL (ref 0–149)
VLDL Cholesterol Cal: 21 mg/dL (ref 5–40)

## 2021-01-14 LAB — RMSF, IGG, IFA: RMSF, IGG, IFA: 1:256 {titer} — ABNORMAL HIGH

## 2021-01-14 LAB — PSA: Prostate Specific Ag, Serum: 1.5 ng/mL (ref 0.0–4.0)

## 2021-01-14 LAB — TSH: TSH: 1.79 u[IU]/mL (ref 0.450–4.500)

## 2021-01-15 MED ORDER — DOXYCYCLINE HYCLATE 100 MG PO TABS: 100.0000 mg | ORAL_TABLET | Freq: Two times a day (BID) | ORAL | 0 refills | Status: AC

## 2021-01-15 NOTE — Progress Notes (Signed)
Please let patient that his lab work shows that he still has rocky mountain spotted fever. I will send in a prescription for Doxycycline twice daily x 21 days to treat this. If patient has any questions please let me know.  This is likely the cause of patient's symptoms.

## 2021-01-15 NOTE — Addendum Note (Signed)
Addended by: Jon Billings on: 01/15/2021 11:03 AM   Modules accepted: Orders

## 2021-01-26 DIAGNOSIS — R197 Diarrhea, unspecified: Secondary | ICD-10-CM | POA: Diagnosis not present

## 2021-01-26 DIAGNOSIS — E860 Dehydration: Secondary | ICD-10-CM | POA: Diagnosis not present

## 2021-01-26 DIAGNOSIS — Z880 Allergy status to penicillin: Secondary | ICD-10-CM | POA: Diagnosis not present

## 2021-02-04 NOTE — Telephone Encounter (Signed)
Appt next week with pcp

## 2021-02-09 ENCOUNTER — Encounter: Payer: Self-pay | Admitting: Nurse Practitioner

## 2021-02-09 ENCOUNTER — Ambulatory Visit (INDEPENDENT_AMBULATORY_CARE_PROVIDER_SITE_OTHER): Payer: Medicare HMO | Admitting: Nurse Practitioner

## 2021-02-09 ENCOUNTER — Other Ambulatory Visit: Payer: Self-pay

## 2021-02-09 VITALS — BP 122/81 | HR 86 | Temp 98.1°F | Ht 74.57 in | Wt 240.8 lb

## 2021-02-09 DIAGNOSIS — S60463D Insect bite (nonvenomous) of left middle finger, subsequent encounter: Secondary | ICD-10-CM | POA: Diagnosis not present

## 2021-02-09 DIAGNOSIS — R197 Diarrhea, unspecified: Secondary | ICD-10-CM | POA: Diagnosis not present

## 2021-02-09 DIAGNOSIS — W57XXXD Bitten or stung by nonvenomous insect and other nonvenomous arthropods, subsequent encounter: Secondary | ICD-10-CM | POA: Diagnosis not present

## 2021-02-09 NOTE — Progress Notes (Signed)
Established Patient Office Visit  Subjective:  Patient ID: Thomas Mercado, male    DOB: October 14, 1955  Age: 65 y.o. MRN: [id number]  CC:  Chief Complaint  Patient presents with   RMSF    Here to follow up after taking medications   Diarrhea    For past 3 weeks    HPI Thomas Mercado presents for follow-up on diarrhea. He was bit by a tick in July and was treated for rocky mountain spotted fever with doxycycline. He endorses having frequent diarrhea since taking the antibiotics. He said that over the last few days, his diarrhea has improved. He also noticed that his diarrhea worsens when he eats red meat. He has started taking an OTC probiotic and bentyl He has increased his fluids and is drinking pedialyte. He recently went to the ER where he received 4 bags of IV fluids and stated his stool studies done were negative for bacteria or parasites.    Past Medical History:  Diagnosis Date   Allergy    Aortic atherosclerosis ([place]) 07/31/2017   Chest x-ray February 2019   [facility]   Atrial fibrillation [facility])    CPAP (continuous positive airway pressure) dependence    Dysrhythmia    ED (erectile dysfunction)    GERD (gastroesophageal reflux disease)    Heart murmur    Hyperlipidemia    Hypertension    Sleep apnea    Sleep apnea     Past Surgical History:  Procedure Laterality Date   ABLATION  Sept 2014   heart   ABLATION  12/2015   heart   COLONOSCOPY WITH PROPOFOL N/A 10/23/2015   Procedure: COLONOSCOPY WITH PROPOFOL;  Surgeon: Lollie Sails, MD;  Location: [facility] ENDOSCOPY;  Service: Endoscopy;  Laterality: N/A;   KNEE ARTHROSCOPY Left 04/09/2015   Procedure: ,left knee arthroscopy, limited synovectomy and partial medial menisectomy.;  Surgeon: Thornton Park, MD;  Location: [facility] ORS;  Service: Orthopedics;  Laterality: Left;   VARICOCELE EXCISION      Family History  Problem Relation Age of Onset   Breast cancer Mother     Alzheimer's disease Father    Hypertension Father    Arthritis Father    Heart disease Brother    Emphysema Brother     Social History   Socioeconomic History   Marital status: Married    Spouse name: Not on file   Number of children: Not on file   Years of education: Not on file   Highest education level: Not on file  Occupational History   Occupation: full time    Employer: [employer]  Tobacco Use   Smoking status: Never   Smokeless tobacco: Never  Vaping Use   Vaping Use: Never used  Substance and Sexual Activity   Alcohol use: Yes    Alcohol/week: 0.0 standard drinks    Comment: on occasion with beer or liquor   Drug use: No   Sexual activity: Yes  Other Topics Concern   Not on file  Social History Narrative   Not on file   Social Determinants of Health   Financial Resource Strain: Not on file  Food Insecurity: Not on file  Transportation Needs: Not on file  Physical Activity: Not on file  Stress: Not on file  Social Connections: Not on file  Intimate Partner Violence: Not on file    Outpatient Medications Prior to Visit  Medication Sig Dispense Refill   aspirin EC  81 MG tablet Take 81 mg by mouth daily.     benazepril (LOTENSIN) 20 MG tablet Take 1 tablet (20 mg total) by mouth daily. 90 tablet 1   cetirizine (ZYRTEC) 10 MG tablet Take 1 tablet (10 mg total) by mouth daily. 90 tablet 3   diclofenac (VOLTAREN) 75 MG EC tablet Take 1 tablet (75 mg total) by mouth 2 (two) times daily as needed. 90 tablet 1   fluticasone (FLONASE) 50 MCG/ACT nasal spray Place 2 sprays into both nostrils daily. 16 g 12   pantoprazole (PROTONIX) 40 MG tablet Take 1 tablet (40 mg total) by mouth in the morning and at bedtime. 180 tablet 0   rosuvastatin (CRESTOR) 5 MG tablet Take 1 tablet (5 mg total) by mouth daily. 90 tablet 0   dicyclomine (BENTYL) 10 MG capsule SMARTSIG:2 Capsule(s) By Mouth 4 Times Daily PRN     No facility-administered medications prior to visit.     Allergies  Allergen Reactions   Penicillin G Benzathine Rash    As a child    ROS Review of Systems  Constitutional:  Positive for fatigue.  Respiratory: Negative.    Cardiovascular: Negative.   Gastrointestinal:  Positive for abdominal pain and diarrhea.  Genitourinary: Negative.   Musculoskeletal: Negative.   Skin: Negative.   Neurological: Negative.      Objective:    Physical Exam Vitals and nursing note reviewed.  Constitutional:      Appearance: Normal appearance.  HENT:     Head: Normocephalic.  Eyes:     Conjunctiva/sclera: Conjunctivae normal.  Cardiovascular:     Rate and Rhythm: Normal rate and regular rhythm.     Pulses: Normal pulses.     Heart sounds: Normal heart sounds.  Pulmonary:     Effort: Pulmonary effort is normal.     Breath sounds: Normal breath sounds.  Abdominal:     General: Bowel sounds are normal.     Palpations: Abdomen is soft.     Tenderness: There is no abdominal tenderness.  Musculoskeletal:     Cervical back: Normal range of motion.  Skin:    General: Skin is warm and dry.  Neurological:     General: No focal deficit present.     Mental Status: He is alert and oriented to person, place, and time.  Psychiatric:        Mood and Affect: Mood normal.        Behavior: Behavior normal.        Thought Content: Thought content normal.        Judgment: Judgment normal.    BP 122/81   Pulse 86   Temp 98.1 F (36.7 C) (Rectal)   Ht 6' 2.57" (1.894 m)   Wt 240 lb 12.8 oz (109.2 kg)   SpO2 97%   BMI 30.45 kg/m  Wt Readings from Last 3 Encounters:  02/09/21 240 lb 12.8 oz (109.2 kg)  01/07/21 247 lb 4 oz (112.2 kg)  01/05/21 248 lb (112.5 kg)     Health Maintenance Due  Topic Date Due   INFLUENZA VACCINE  01/18/2021    There are no preventive care reminders to display for this patient.  Lab Results  Component Value Date   TSH 1.790 01/07/2021   Lab Results  Component Value Date   WBC 5.6 01/07/2021   HGB  14.8 01/07/2021   HCT 44.3 01/07/2021   MCV 90 01/07/2021   PLT 164 01/07/2021   Lab Results  Component Value Date  NA 142 01/07/2021   K 4.5 01/07/2021   CO2 24 01/07/2021   GLUCOSE 95 01/07/2021   BUN 21 01/07/2021   CREATININE 0.86 01/07/2021   BILITOT 0.4 01/07/2021   ALKPHOS 84 01/07/2021   AST 16 01/07/2021   ALT 21 01/07/2021   PROT 6.7 01/07/2021   ALBUMIN 4.7 01/07/2021   CALCIUM 9.8 01/07/2021   ANIONGAP 9 12/17/2015   EGFR 95 01/07/2021   Lab Results  Component Value Date   CHOL 212 (H) 01/07/2021   Lab Results  Component Value Date   HDL 52 01/07/2021   Lab Results  Component Value Date   LDLCALC 139 (H) 01/07/2021   Lab Results  Component Value Date   TRIG 116 01/07/2021   Lab Results  Component Value Date   CHOLHDL 4.1 01/07/2021   No results found for: HGBA1C    Assessment & Plan:   Problem List Items Addressed This Visit   None Visit Diagnoses     Diarrhea, unspecified type    -  Primary   Continue probiotic. Increase fiber intake, can start metamucil OTC. Encouraged fluid intake. F/U if symptoms don't improve/worsen   Relevant Orders   Alpha-Gal Panel   Tick bite of left middle finger, subsequent encounter       Finished doxycycline. With increased diarrhea with red meat, will check alpha gal panel.    Relevant Orders   Alpha-Gal Panel       No orders of the defined types were placed in this encounter.   Follow-up: Return if symptoms worsen or fail to improve.    Charyl Dancer, NP

## 2021-02-09 NOTE — Patient Instructions (Addendum)
Continue taking probiotic  Start metamucil or benefiber and increasing your foods in fiber content Make sure you are drinking plenty of fluids

## 2021-02-13 LAB — ALPHA-GAL PANEL
Allergen Lamb IgE: <0.1 kU/L
Beef IgE: 0.43 kU/L — AB
IgE (Immunoglobulin E), Serum: 53 IU/mL (ref 6–495)
O215-IgE Alpha-Gal: 1.45 kU/L — AB
Pork IgE: 0.28 kU/L — AB

## 2021-02-15 ENCOUNTER — Encounter: Payer: Self-pay | Admitting: Nurse Practitioner

## 2021-02-15 DIAGNOSIS — T781XXA Other adverse food reactions, not elsewhere classified, initial encounter: Secondary | ICD-10-CM

## 2021-02-15 NOTE — Progress Notes (Signed)
Contacted via MyChart   Good morning Thomas Mercado, sorry for the delay, Ander Purpura has been off on vacation the past days.  Your alpha gal testing did return high level meaning post tick bite you have built a sensitivity to red meat and pork (low level).  At this time the treatment is avoidance of these to the best of your ability.  This would explain recent diarrhea after eating red meat products.  If worsening allergies to this we will at time send to allergist and provide epi pen.  We will monitor labs closely to see if overtime this sensitivity declines.  Any questions?

## 2021-02-19 DIAGNOSIS — M25512 Pain in left shoulder: Secondary | ICD-10-CM | POA: Diagnosis not present

## 2021-03-01 ENCOUNTER — Other Ambulatory Visit: Payer: Self-pay | Admitting: Orthopedic Surgery

## 2021-03-01 ENCOUNTER — Other Ambulatory Visit (HOSPITAL_COMMUNITY): Payer: Self-pay | Admitting: Orthopedic Surgery

## 2021-03-01 DIAGNOSIS — M25512 Pain in left shoulder: Secondary | ICD-10-CM

## 2021-03-12 ENCOUNTER — Ambulatory Visit: Admission: RE | Admit: 2021-03-12 | Payer: Medicare HMO | Source: Ambulatory Visit

## 2021-03-12 ENCOUNTER — Other Ambulatory Visit: Payer: Medicare HMO

## 2021-03-14 ENCOUNTER — Ambulatory Visit: Payer: Medicare HMO

## 2021-03-18 ENCOUNTER — Ambulatory Visit (INDEPENDENT_AMBULATORY_CARE_PROVIDER_SITE_OTHER): Payer: Medicare HMO

## 2021-03-18 DIAGNOSIS — Z0000 Encounter for general adult medical examination without abnormal findings: Secondary | ICD-10-CM | POA: Diagnosis not present

## 2021-03-18 NOTE — Progress Notes (Signed)
Subjective:   Thomas Mercado is a 65 y.o. male who presents for Medicare Annual/Subsequent preventive examination.  I connected with  [name] on 03/18/21 by an audio only telemedicine application and verified that I am speaking with the correct person using two identifiers.   I discussed the limitations, risks, security and privacy concerns of performing an evaluation and management service by telephone and the availability of in person appointments. I also discussed with the patient that there may be a patient responsible charge related to this service. The patient expressed understanding and verbally consented to this telephonic visit.  Location of Patient: home Location of Provider: office  List any persons and their role that are participating in the visit with the patient.    Review of Systems    Defer to PCP.   Cardiac Risk Factors include: advanced age (>53men, >76 women);male gender;hypertension     Objective:    Today's Vitals   03/18/21 1629  PainSc: 0-No pain   There is no height or weight on file to calculate BMI.  Advanced Directives 03/18/2021 08/28/2017 08/21/2017 07/29/2017 03/31/2015  Does Patient Have a Medical Advance Directive? No No No No No  Would patient like information on creating a medical advance directive? No - Patient declined No - Patient declined No - Patient declined No - Patient declined No - patient declined information    Current Medications (verified) Outpatient Encounter Medications as of 03/18/2021  Medication Sig   aspirin EC 81 MG tablet Take 81 mg by mouth daily.   benazepril (LOTENSIN) 20 MG tablet Take 1 tablet (20 mg total) by mouth daily.   cetirizine (ZYRTEC) 10 MG tablet Take 1 tablet (10 mg total) by mouth daily.   diclofenac (VOLTAREN) 75 MG EC tablet Take 1 tablet (75 mg total) by mouth 2 (two) times daily as needed.   fluticasone (FLONASE) 50 MCG/ACT nasal spray Place 2 sprays into both nostrils daily.   rosuvastatin (CRESTOR)  5 MG tablet Take 1 tablet (5 mg total) by mouth daily.   pantoprazole (PROTONIX) 40 MG tablet Take 1 tablet (40 mg total) by mouth in the morning and at bedtime.   [DISCONTINUED] dicyclomine (BENTYL) 10 MG capsule SMARTSIG:2 Capsule(s) By Mouth 4 Times Daily PRN   No facility-administered encounter medications on file as of 03/18/2021.    Allergies (verified) Penicillin g benzathine   History: Past Medical History:  Diagnosis Date   Allergy    Aortic atherosclerosis ([place]) 07/31/2017   Chest x-ray February 2019   Atrial fibrillation [facility]   Atrial fibrillation ([place])    CPAP (continuous positive airway pressure) dependence    Dysrhythmia    ED (erectile dysfunction)    GERD (gastroesophageal reflux disease)    Heart murmur    Hyperlipidemia    Hypertension    Sleep apnea    Sleep apnea    Past Surgical History:  Procedure Laterality Date   ABLATION  Sept 2014   heart   ABLATION  12/2015   heart   COLONOSCOPY WITH PROPOFOL N/A 10/23/2015   Procedure: COLONOSCOPY WITH PROPOFOL;  Surgeon: Lollie Sails, MD;  Location: [facility] ENDOSCOPY;  Service: Endoscopy;  Laterality: N/A;   KNEE ARTHROSCOPY Left 04/09/2015   Procedure: ,left knee arthroscopy, limited synovectomy and partial medial menisectomy.;  Surgeon: Thornton Park, MD;  Location: [facility] ORS;  Service: Orthopedics;  Laterality: Left;   VARICOCELE EXCISION     Family History  Problem Relation Age  of Onset   Breast cancer Mother    Alzheimer's disease Father    Hypertension Father    Arthritis Father    Heart disease Brother    Emphysema Brother    Social History   Socioeconomic History   Marital status: Married    Spouse name: Not on file   Number of children: Not on file   Years of education: Not on file   Highest education level: Not on file  Occupational History   Occupation: full time    Employer: [employer]  Tobacco Use   Smoking status: Never   Smokeless  tobacco: Never  Vaping Use   Vaping Use: Never used  Substance and Sexual Activity   Alcohol use: Yes    Alcohol/week: 0.0 standard drinks    Comment: on occasion with beer or liquor   Drug use: No   Sexual activity: Yes  Other Topics Concern   Not on file  Social History Narrative   Not on file   Social Determinants of Health   Financial Resource Strain: Low Risk    Difficulty of Paying Living Expenses: Not hard at all  Food Insecurity: No Food Insecurity   Worried About Charity fundraiser in the Last Year: Never true   [place] in the Last Year: Never true  Transportation Needs: No Transportation Needs   Lack of Transportation (Medical): No   Lack of Transportation (Non-Medical): No  Physical Activity: Sufficiently Active   Days of Exercise per Week: 7 days   Minutes of Exercise per Session: 60 min  Stress: No Stress Concern Present   Feeling of Stress : Not at all  Social Connections: Moderately Integrated   Frequency of Communication with Friends and Family: More than three times a week   Frequency of Social Gatherings with Friends and Family: Once a week   Attends Religious Services: Never   Marine scientist or Organizations: Yes   Attends Music therapist: More than 4 times per year   Marital Status: Married    Tobacco Counseling Counseling given: Not Answered   Clinical Intake:  Pre-visit preparation completed: Yes  Pain : No/denies pain Pain Score: 0-No pain     Nutritional Risks: None Diabetes: No  How often do you need to have someone help you when you read instructions, pamphlets, or other written materials from your doctor or pharmacy?: 1 - Never What is the last grade level you completed in school?: Technical College  Diabetic?No  Interpreter Needed?: No      Activities of Daily Living In your present state of health, do you have any difficulty performing the following activities: 03/18/2021 01/07/2021  Hearing? N  N  Vision? N N  Difficulty concentrating or making decisions? N N  Walking or climbing stairs? N N  Dressing or bathing? N N  Doing errands, shopping? N N  Preparing Food and eating ? N -  Using the Toilet? N -  In the past six months, have you accidently leaked urine? N -  Do you have problems with loss of bowel control? N -  Managing your Medications? N -  Managing your Finances? N -  Housekeeping or managing your Housekeeping? N -  Some recent data might be hidden    Patient Care Team: Jon Billings, NP as PCP - General Telford Nab, RN as Registered Nurse  Indicate any recent Medical Services you may have received from other than [facility] providers in the  past year (date may be approximate).     Assessment:   This is a routine wellness examination for Connie.  Hearing/Vision screen No results found.  Dietary issues and exercise activities discussed: Current Exercise Habits: Home exercise routine, Type of exercise: walking, Time (Minutes): 60, Frequency (Times/Week): 7, Weekly Exercise (Minutes/Week): 420, Intensity: Mild, Exercise limited by: None identified   Goals Addressed   None   Depression Screen PHQ 2/9 Scores 03/18/2021 02/09/2021 01/07/2021 03/11/2020 12/25/2019 12/12/2018 05/30/2018  PHQ - 2 Score 0 0 0 0 2 0 0  PHQ- 9 Score - - - - - 2 -    Fall Risk Fall Risk  03/18/2021 02/09/2021 01/07/2021 03/11/2020 12/25/2019  Falls in the past year? 0 0 0 0 0  Number falls in past yr: 0 0 0 0 0  Injury with Fall? 0 0 0 0 0  Risk for fall due to : No Fall Risks - No Fall Risks - -  Follow up Falls evaluation completed Falls evaluation completed Falls evaluation completed Falls evaluation completed -    FALL RISK PREVENTION PERTAINING TO THE HOME:  Any stairs in or around the home? Yes  If so, are there any without handrails? No  Home free of loose throw rugs in walkways, pet beds, electrical cords, etc? Yes  Adequate lighting in your home to reduce risk of falls? Yes    ASSISTIVE DEVICES UTILIZED TO PREVENT FALLS:  Life alert? No  Use of a cane, walker or w/c? No  Grab bars in the bathroom? No  Shower chair or bench in shower? No  Elevated toilet seat or a handicapped toilet? No   TIMED UP AND GO:  Was the test performed? No .  Length of time to ambulate 10 feet: N/A sec.     Cognitive Function:     6CIT Screen 03/18/2021  What Year? 0 points  What month? 0 points  What time? 0 points  Count back from 20 0 points  Months in reverse 0 points  Repeat phrase 0 points  Total Score 0    Immunizations Immunization History  Administered Date(s) Administered   Influenza,inj,Quad PF,6+ Mos 03/11/2015, 04/18/2018, 04/16/2019, 03/11/2020   Influenza-Unspecified 03/28/2017, 03/11/2020   PFIZER(Purple Top)SARS-COV-2 Vaccination 09/06/2019, 10/02/2019, 05/27/2020   Pneumococcal Conjugate-13 12/25/2019   Tdap 07/04/2013   Zoster Recombinat (Shingrix) 04/16/2019, 08/06/2019   Zoster, Live 01/26/2016    TDAP status: Up to date  Flu Vaccine status: Due, Education has been provided regarding the importance of this vaccine. Advised may receive this vaccine at local pharmacy or Health Dept. Aware to provide a copy of the vaccination record if obtained from local pharmacy or Health Dept. Verbalized acceptance and understanding.  Pneumococcal vaccine status: Up to date  Covid-19 vaccine status: Completed vaccines  Qualifies for Shingles Vaccine? Yes   Zostavax completed Yes   Shingrix Completed?: Yes  Screening Tests Health Maintenance  Topic Date Due   COVID-19 Vaccine (4 - Booster for Pfizer series) 08/19/2020   INFLUENZA VACCINE  01/18/2021   TETANUS/TDAP  07/05/2023   COLONOSCOPY (Pts 45-85yrs Insurance coverage will need to be confirmed)  10/22/2025   Hepatitis C Screening  Completed   Zoster Vaccines- Shingrix  Completed   HPV VACCINES  Aged Out    Health Maintenance  Health Maintenance Due  Topic Date Due   COVID-19 Vaccine (4  - Booster for Pfizer series) 08/19/2020   INFLUENZA VACCINE  01/18/2021    Colorectal cancer screening: Type of screening: Colonoscopy. Completed  11/02/15. Repeat every 10 years  Lung Cancer Screening: (Low Dose CT Chest recommended if Age 26-80 years, 30 pack-year currently smoking OR have quit w/in 15years.) does not qualify.   Lung Cancer Screening Referral: N/A  Additional Screening:  Hepatitis C Screening: does qualify; Completed 06/05/17  Vision Screening: Recommended annual ophthalmology exams for early detection of glaucoma and other disorders of the eye. Is the patient up to date with their annual eye exam?  Yes  Who is the provider or what is the name of the office in which the patient attends annual eye exams? Dr. Atilano Median If pt is not established with a provider, would they like to be referred to a provider to establish care? No .   Dental Screening: Recommended annual dental exams for proper oral hygiene  Community Resource Referral / Chronic Care Management: CRR required this visit?  No   CCM required this visit?  No      Plan:     I have personally reviewed and noted the following in the patient's chart:   Medical and social history Use of alcohol, tobacco or illicit drugs  Current medications and supplements including opioid prescriptions. Patient is not currently taking opioid prescriptions. Functional ability and status Nutritional status Physical activity Advanced directives List of other physicians Hospitalizations, surgeries, and ER visits in previous 12 months Vitals Screenings to include cognitive, depression, and falls Referrals and appointments  In addition, I have reviewed and discussed with patient certain preventive protocols, quality metrics, and best practice recommendations. A written personalized care plan for preventive services as well as general preventive health recommendations were provided to patient.   Mr. Dettman , Thank you for  taking time to come for your Medicare Wellness Visit. I appreciate your ongoing commitment to your health goals. Please review the following plan we discussed and let me know if I can assist you in the future.   These are the goals we discussed:  Goals   None     This is a list of the screening recommended for you and due dates:  Health Maintenance  Topic Date Due   COVID-19 Vaccine (4 - Booster for Pfizer series) 08/19/2020   Flu Shot  01/18/2021   Tetanus Vaccine  07/05/2023   Colon Cancer Screening  10/22/2025   Hepatitis C Screening: USPSTF Recommendation to screen - Ages 18-79 yo.  Completed   Zoster (Shingles) Vaccine  Completed   HPV Vaccine  Aged Out        [place], [place]   03/18/2021   Nurse Notes: Non Face to Face 60 Minutes

## 2021-03-23 ENCOUNTER — Ambulatory Visit
Admission: RE | Admit: 2021-03-23 | Discharge: 2021-03-23 | Disposition: A | Discharge status: Home / Self Care | Payer: Medicare HMO | Source: Ambulatory Visit | Attending: Orthopedic Surgery | Admitting: Orthopedic Surgery

## 2021-03-23 ENCOUNTER — Other Ambulatory Visit: Payer: Self-pay

## 2021-03-23 DIAGNOSIS — G8929 Other chronic pain: Secondary | ICD-10-CM | POA: Diagnosis not present

## 2021-03-23 DIAGNOSIS — M75112 Incomplete rotator cuff tear or rupture of left shoulder, not specified as traumatic: Secondary | ICD-10-CM | POA: Insufficient documentation

## 2021-03-23 DIAGNOSIS — M25512 Pain in left shoulder: Secondary | ICD-10-CM

## 2021-03-23 DIAGNOSIS — S46012A Strain of muscle(s) and tendon(s) of the rotator cuff of left shoulder, initial encounter: Secondary | ICD-10-CM | POA: Diagnosis not present

## 2021-03-23 DIAGNOSIS — R531 Weakness: Secondary | ICD-10-CM | POA: Diagnosis not present

## 2021-03-23 DIAGNOSIS — M19012 Primary osteoarthritis, left shoulder: Secondary | ICD-10-CM | POA: Diagnosis not present

## 2021-03-23 MED ORDER — SODIUM CHLORIDE (PF) 0.9 % IJ SOLN
7.0000 mL | Freq: Once | INTRAMUSCULAR | Status: AC
  Administered 2021-03-23: 7 mL

## 2021-03-23 MED ORDER — GADOBUTROL 1 MMOL/ML IV SOLN
0.0500 mL | Freq: Once | INTRAVENOUS | Status: AC | PRN
  Administered 2021-03-23: 0.05 mL

## 2021-03-23 MED ORDER — LIDOCAINE HCL (PF) 1 % IJ SOLN
3.0000 mL | Freq: Once | INTRAMUSCULAR | Status: AC
  Administered 2021-03-23: 3 mL
  Filled 2021-03-23: qty 4

## 2021-03-23 MED ORDER — IOHEXOL 180 MG/ML  SOLN
20.0000 mL | Freq: Once | INTRAMUSCULAR | Status: AC | PRN
  Administered 2021-03-23: 15 mL

## 2021-03-23 NOTE — Procedures (Signed)
PROCEDURE SUMMARY:  Successful fluoroscopic guided left shoulder arthrogram.  No immediate complications.  Pt tolerated well.  See follow up MRI report for details and findings.  EBL N/A  [name] 03/23/2021 8:33 AM

## 2021-03-31 DIAGNOSIS — M75122 Complete rotator cuff tear or rupture of left shoulder, not specified as traumatic: Secondary | ICD-10-CM | POA: Diagnosis not present

## 2021-04-05 MED ORDER — ROSUVASTATIN CALCIUM 5 MG PO TABS: 5.0000 mg | ORAL_TABLET | Freq: Every day | ORAL | 1 refills | Status: DC

## 2021-04-14 ENCOUNTER — Telehealth: Payer: Self-pay | Admitting: Nurse Practitioner

## 2021-04-14 NOTE — Telephone Encounter (Signed)
Copied from [place] [id number]. Topic: General - Other >> Apr 14, 2021  3:00 PM Tessa Lerner A wrote: Reason for CRM: The patient would like to be contacted by a member of clinical staff to discuss their vaccinations   The patient has questions about how long they are effective and also would like to discuss scheduling

## 2021-04-15 DIAGNOSIS — M75122 Complete rotator cuff tear or rupture of left shoulder, not specified as traumatic: Secondary | ICD-10-CM | POA: Diagnosis not present

## 2021-04-15 NOTE — Telephone Encounter (Signed)
Called and discussed vaccinations with patient. He states that he got his flu and covid vaccines yesterday. Patient asked about pneu vaccines. Explained that he is due for his Pneumovax, appointment scheduled for vaccine.

## 2021-04-16 ENCOUNTER — Other Ambulatory Visit: Payer: Self-pay

## 2021-04-16 ENCOUNTER — Ambulatory Visit (INDEPENDENT_AMBULATORY_CARE_PROVIDER_SITE_OTHER): Payer: Medicare HMO

## 2021-04-16 DIAGNOSIS — Z23 Encounter for immunization: Secondary | ICD-10-CM

## 2021-04-27 DIAGNOSIS — Z85828 Personal history of other malignant neoplasm of skin: Secondary | ICD-10-CM | POA: Diagnosis not present

## 2021-04-27 DIAGNOSIS — L578 Other skin changes due to chronic exposure to nonionizing radiation: Secondary | ICD-10-CM | POA: Diagnosis not present

## 2021-04-27 DIAGNOSIS — L570 Actinic keratosis: Secondary | ICD-10-CM | POA: Diagnosis not present

## 2021-04-27 DIAGNOSIS — Z859 Personal history of malignant neoplasm, unspecified: Secondary | ICD-10-CM | POA: Diagnosis not present

## 2021-04-27 DIAGNOSIS — L821 Other seborrheic keratosis: Secondary | ICD-10-CM | POA: Diagnosis not present

## 2021-05-16 ENCOUNTER — Other Ambulatory Visit: Payer: Self-pay | Admitting: Orthopedic Surgery

## 2021-05-17 ENCOUNTER — Other Ambulatory Visit: Payer: Self-pay

## 2021-05-17 ENCOUNTER — Encounter
Admission: RE | Admit: 2021-05-17 | Discharge: 2021-05-17 | Disposition: A | Discharge status: Home / Self Care | Payer: Medicare HMO | Source: Ambulatory Visit | Attending: Orthopedic Surgery | Admitting: Orthopedic Surgery

## 2021-05-17 DIAGNOSIS — Z01818 Encounter for other preprocedural examination: Secondary | ICD-10-CM | POA: Diagnosis present

## 2021-05-17 DIAGNOSIS — I10 Essential (primary) hypertension: Secondary | ICD-10-CM

## 2021-05-17 DIAGNOSIS — I480 Paroxysmal atrial fibrillation: Secondary | ICD-10-CM | POA: Diagnosis not present

## 2021-05-17 DIAGNOSIS — Z01810 Encounter for preprocedural cardiovascular examination: Secondary | ICD-10-CM | POA: Diagnosis not present

## 2021-05-17 LAB — BASIC METABOLIC PANEL
Anion gap: 7 (ref 5–15)
BUN: 22 mg/dL (ref 8–23)
CO2: 25 mmol/L (ref 22–32)
Calcium: 9.3 mg/dL (ref 8.9–10.3)
Chloride: 105 mmol/L (ref 98–111)
Creatinine, Ser: 0.92 mg/dL (ref 0.61–1.24)
GFR, Estimated: >60 mL/min (ref >60)
Glucose, Bld: 76 mg/dL (ref 70–99)
Potassium: 4 mmol/L (ref 3.5–5.1)
Sodium: 137 mmol/L (ref 135–145)

## 2021-05-17 LAB — CBC
HCT: 40.1 % (ref 39.0–52.0)
Hemoglobin: 13.7 g/dL (ref 13.0–17.0)
MCH: 30.4 pg (ref 26.0–34.0)
MCHC: 34.2 g/dL (ref 30.0–36.0)
MCV: 88.9 fL (ref 80.0–100.0)
Platelets: 198 10*3/uL (ref 150–400)
RBC: 4.51 MIL/uL (ref 4.22–5.81)
RDW: 12.8 % (ref 11.5–15.5)
WBC: 6.2 10*3/uL (ref 4.0–10.5)
nRBC: 0 % (ref 0.0–0.2)

## 2021-05-17 NOTE — Patient Instructions (Addendum)
Your procedure is scheduled on:05/20/2021  Report to the Registration Desk on the [facility]st floor of the [facility]. To find out your arrival time, please call [phone] between 1PM - 3PM on: 05/19/2021   REMEMBER: Instructions that are not followed completely may result in serious medical risk, up to and including death; or upon the discretion of your surgeon and anesthesiologist your surgery may need to be rescheduled.  Do not eat food after midnight the night before surgery.  No gum chewing, lozengers or hard candies.  You may however, drink CLEAR liquids up to 2 hours before you are scheduled to arrive for your surgery. Do not drink anything within 2 hours of your scheduled arrival time.  Clear liquids include: - water  - apple juice without pulp - gatorade (not RED, PURPLE, OR BLUE) - black coffee or tea (Do NOT add milk or creamers to the coffee or tea) Do NOT drink anything that is not on this list.   TAKE THESE MEDICATIONS THE MORNING OF SURGERY WITH A SIP OF WATER: Tylenol as needed Protonix (take one the night before and one on the morning of surgery - helps to prevent nausea after surgery.) Crestor it takes this in am.    Follow recommendations from Cardiologist, Pulmonologist or PCP regarding stopping Aspirin  One week prior to surgery: Stop Anti-inflammatories (NSAIDS) such as Advil, Aleve, Ibuprofen, Motrin, Naproxen, Naprosyn and Aspirin based products such as Excedrin, Goodys Powder, BC Powder. diclofenac  Stop ANY OVER THE COUNTER supplements until after surgery like probiotic  You may however, continue to take Tylenol if needed for pain up until the day of surgery.  No Alcohol for 24 hours before or after surgery.  No Smoking including e-cigarettes for 24 hours prior to surgery.  No chewable tobacco products for at least 6 hours prior to surgery.  No nicotine patches on the day of surgery.  Do not use any "recreational" drugs for at least a week prior to your  surgery.  Please be advised that the combination of cocaine and anesthesia may have negative outcomes, up to and including death. If you test positive for cocaine, your surgery will be cancelled.  On the morning of surgery brush your teeth with toothpaste and water, you may rinse your mouth with mouthwash if you wish. Do not swallow any toothpaste or mouthwash.  Bring your CPAP and leave in the Car in case you get admitted overnight  Use CHG Soap or wipes as directed on instruction sheet.  Do not wear jewelry, make-up, hairpins, clips or nail polish.  Do not wear lotions, powders, or perfumes.   Do not shave body from the neck down 48 hours prior to surgery just in case you cut yourself which could leave a site for infection.  Also, freshly shaved skin may become irritated if using the CHG soap.  Contact lenses, hearing aids and dentures may not be worn into surgery.  Do not bring valuables to the hospital. [facility] is not responsible for any missing/lost belongings or valuables.    Notify your doctor if there is any change in your medical condition (cold, fever, infection).  Wear comfortable clothing (specific to your surgery type) to the hospital.  After surgery, you can help prevent lung complications by doing breathing exercises.  Take deep breaths and cough every 1-2 hours. Your doctor may order a device called an Incentive Spirometer to help you take deep breaths.  If you are being admitted to the hospital overnight, leave  your suitcase in the car. After surgery it may be brought to your room.  If you are being discharged the day of surgery, you will not be allowed to drive home. You will need a responsible adult (18 years or older) to drive you home and stay with you that night.   If you are taking public transportation, you will need to have a responsible adult (18 years or older) with you. Please confirm with your physician that it is acceptable to use public  transportation.   Please call the [place] [facility]. at [phone] if you have any questions about these instructions.  Surgery Visitation Policy:  Patients undergoing a surgery or procedure may have one family member or support person with them as long as that person is not COVID-19 positive or experiencing its symptoms.  That person may remain in the waiting area during the procedure and may rotate out with other people.  Inpatient Visitation:    Visiting hours are 7 a.m. to 8 p.m. Up to two visitors ages 16+ are allowed at one time in a patient room. The visitors may rotate out with other people during the day. Visitors must check out when they leave, or other visitors will not be allowed. One designated support person may remain overnight. The visitor must pass COVID-19 screenings, use hand sanitizer when entering and exiting the patient's room and wear a mask at all times, including in the patient's room. Patients must also wear a mask when staff or their visitor are in the room. Masking is required regardless of vaccination status.

## 2021-05-20 ENCOUNTER — Encounter
Admission: RE | Disposition: A | Discharge status: Home / Self Care | Payer: Self-pay | Source: Ambulatory Visit | Attending: Orthopedic Surgery

## 2021-05-20 ENCOUNTER — Ambulatory Visit
Admission: RE | Admit: 2021-05-20 | Discharge: 2021-05-20 | Disposition: A | Discharge status: Home / Self Care | Payer: Medicare HMO | Source: Ambulatory Visit | Attending: Orthopedic Surgery | Admitting: Orthopedic Surgery

## 2021-05-20 ENCOUNTER — Ambulatory Visit: Payer: Medicare HMO | Admitting: Certified Registered Nurse Anesthetist

## 2021-05-20 ENCOUNTER — Encounter: Payer: Self-pay | Admitting: Orthopedic Surgery

## 2021-05-20 ENCOUNTER — Ambulatory Visit: Payer: Medicare HMO | Admitting: Urgent Care

## 2021-05-20 ENCOUNTER — Other Ambulatory Visit: Payer: Self-pay

## 2021-05-20 ENCOUNTER — Ambulatory Visit: Payer: Medicare HMO

## 2021-05-20 DIAGNOSIS — M75122 Complete rotator cuff tear or rupture of left shoulder, not specified as traumatic: Secondary | ICD-10-CM | POA: Diagnosis not present

## 2021-05-20 DIAGNOSIS — I10 Essential (primary) hypertension: Secondary | ICD-10-CM | POA: Diagnosis not present

## 2021-05-20 DIAGNOSIS — K219 Gastro-esophageal reflux disease without esophagitis: Secondary | ICD-10-CM | POA: Diagnosis not present

## 2021-05-20 DIAGNOSIS — M25512 Pain in left shoulder: Secondary | ICD-10-CM | POA: Diagnosis not present

## 2021-05-20 DIAGNOSIS — E785 Hyperlipidemia, unspecified: Secondary | ICD-10-CM | POA: Insufficient documentation

## 2021-05-20 DIAGNOSIS — M19012 Primary osteoarthritis, left shoulder: Secondary | ICD-10-CM | POA: Diagnosis not present

## 2021-05-20 DIAGNOSIS — M75102 Unspecified rotator cuff tear or rupture of left shoulder, not specified as traumatic: Secondary | ICD-10-CM | POA: Diagnosis not present

## 2021-05-20 DIAGNOSIS — I4891 Unspecified atrial fibrillation: Secondary | ICD-10-CM | POA: Diagnosis not present

## 2021-05-20 DIAGNOSIS — G8918 Other acute postprocedural pain: Secondary | ICD-10-CM | POA: Diagnosis not present

## 2021-05-20 DIAGNOSIS — M7542 Impingement syndrome of left shoulder: Secondary | ICD-10-CM | POA: Diagnosis not present

## 2021-05-20 DIAGNOSIS — Z419 Encounter for procedure for purposes other than remedying health state, unspecified: Secondary | ICD-10-CM

## 2021-05-20 DIAGNOSIS — G4730 Sleep apnea, unspecified: Secondary | ICD-10-CM | POA: Diagnosis not present

## 2021-05-20 DIAGNOSIS — M13812 Other specified arthritis, left shoulder: Secondary | ICD-10-CM | POA: Diagnosis not present

## 2021-05-20 HISTORY — PX: SHOULDER ARTHROSCOPY WITH OPEN ROTATOR CUFF REPAIR AND DISTAL CLAVICLE ACROMINECTOMY: SHX5683

## 2021-05-20 SURGERY — SHOULDER ARTHROSCOPY WITH OPEN ROTATOR CUFF REPAIR AND DISTAL CLAVICLE ACROMINECTOMY
Anesthesia: General | Site: Shoulder | Laterality: Left

## 2021-05-20 MED ORDER — PROPOFOL 10 MG/ML IV BOLUS
INTRAVENOUS | Status: DC | PRN
  Administered 2021-05-20: 200 mg via INTRAVENOUS

## 2021-05-20 MED ORDER — FENTANYL CITRATE PF 50 MCG/ML IJ SOSY
PREFILLED_SYRINGE | INTRAMUSCULAR | Status: AC
  Administered 2021-05-20: 50 ug via INTRAVENOUS
  Filled 2021-05-20: qty 1

## 2021-05-20 MED ORDER — NEOMYCIN-POLYMYXIN B GU 40-200000 IR SOLN
Status: AC
  Filled 2021-05-20: qty 2

## 2021-05-20 MED ORDER — BUPIVACAINE HCL (PF) 0.5 % IJ SOLN
INTRAMUSCULAR | Status: AC
  Filled 2021-05-20: qty 10

## 2021-05-20 MED ORDER — EPINEPHRINE PF 1 MG/ML IJ SOLN
INTRAMUSCULAR | Status: AC
  Filled 2021-05-20: qty 2

## 2021-05-20 MED ORDER — ONDANSETRON HCL 4 MG PO TABS: 4.0000 mg | ORAL_TABLET | Freq: Three times a day (TID) | ORAL | 0 refills | Status: DC | PRN

## 2021-05-20 MED ORDER — PHENYLEPHRINE HCL (PRESSORS) 10 MG/ML IV SOLN
INTRAVENOUS | Status: DC | PRN
  Administered 2021-05-20: 40 ug via INTRAVENOUS
  Administered 2021-05-20 (×2): 80 ug via INTRAVENOUS

## 2021-05-20 MED ORDER — FENTANYL CITRATE (PF) 100 MCG/2ML IJ SOLN
INTRAMUSCULAR | Status: DC | PRN
  Administered 2021-05-20 (×2): 50 ug via INTRAVENOUS

## 2021-05-20 MED ORDER — LACTATED RINGERS IR SOLN
Status: DC | PRN
  Administered 2021-05-20 (×4): 3000 mL

## 2021-05-20 MED ORDER — PHENYLEPHRINE HCL-NACL 20-0.9 MG/250ML-% IV SOLN
INTRAVENOUS | Status: DC | PRN
  Administered 2021-05-20: 40 ug/min via INTRAVENOUS

## 2021-05-20 MED ORDER — LIDOCAINE HCL (PF) 1 % IJ SOLN
INTRAMUSCULAR | Status: AC
  Filled 2021-05-20: qty 5

## 2021-05-20 MED ORDER — MIDAZOLAM HCL 2 MG/2ML IJ SOLN
INTRAMUSCULAR | Status: DC | PRN
  Administered 2021-05-20 (×2): 1 mg via INTRAVENOUS

## 2021-05-20 MED ORDER — OXYCODONE HCL 5 MG PO TABS: 5.0000 mg | ORAL_TABLET | Freq: Once | ORAL | Status: DC | PRN

## 2021-05-20 MED ORDER — FENTANYL CITRATE (PF) 100 MCG/2ML IJ SOLN: 25.0000 ug | INTRAMUSCULAR | Status: DC | PRN

## 2021-05-20 MED ORDER — CHLORHEXIDINE GLUCONATE CLOTH 2 % EX PADS: 6.0000 | MEDICATED_PAD | Freq: Once | CUTANEOUS | Status: DC

## 2021-05-20 MED ORDER — CEFAZOLIN SODIUM-DEXTROSE 2-4 GM/100ML-% IV SOLN
2.0000 g | INTRAVENOUS | Status: AC
  Administered 2021-05-20: 2 g via INTRAVENOUS

## 2021-05-20 MED ORDER — DEXAMETHASONE SODIUM PHOSPHATE 10 MG/ML IJ SOLN
INTRAMUSCULAR | Status: DC | PRN
  Administered 2021-05-20: 10 mg via INTRAVENOUS

## 2021-05-20 MED ORDER — ACETAMINOPHEN 500 MG PO TABS: 1000.0000 mg | ORAL_TABLET | ORAL | Status: AC

## 2021-05-20 MED ORDER — CHLORHEXIDINE GLUCONATE 0.12 % MT SOLN
OROMUCOSAL | Status: AC
  Administered 2021-05-20: 15 mL via OROMUCOSAL
  Filled 2021-05-20: qty 15

## 2021-05-20 MED ORDER — OXYCODONE HCL 5 MG/5ML PO SOLN: 5.0000 mg | Freq: Once | ORAL | Status: DC | PRN

## 2021-05-20 MED ORDER — MIDAZOLAM HCL 2 MG/2ML IJ SOLN
INTRAMUSCULAR | Status: AC
  Administered 2021-05-20: 1 mg via INTRAVENOUS
  Filled 2021-05-20: qty 2

## 2021-05-20 MED ORDER — BUPIVACAINE HCL (PF) 0.5 % IJ SOLN
INTRAMUSCULAR | Status: DC | PRN
  Administered 2021-05-20: 7 mL via PERINEURAL
  Administered 2021-05-20: 3 mL via PERINEURAL

## 2021-05-20 MED ORDER — LACTATED RINGERS IV SOLN: INTRAVENOUS | Status: DC

## 2021-05-20 MED ORDER — MIDAZOLAM HCL 2 MG/2ML IJ SOLN
INTRAMUSCULAR | Status: AC
  Filled 2021-05-20: qty 2

## 2021-05-20 MED ORDER — MIDAZOLAM HCL 2 MG/2ML IJ SOLN: 1.0000 mg | INTRAMUSCULAR | Status: DC | PRN

## 2021-05-20 MED ORDER — CHLORHEXIDINE GLUCONATE 0.12 % MT SOLN: 15.0000 mL | Freq: Once | OROMUCOSAL | Status: AC

## 2021-05-20 MED ORDER — ONDANSETRON HCL 4 MG/2ML IJ SOLN
INTRAMUSCULAR | Status: AC
  Filled 2021-05-20: qty 2

## 2021-05-20 MED ORDER — OXYCODONE HCL 5 MG PO TABS: 5.0000 mg | ORAL_TABLET | ORAL | 0 refills | Status: DC | PRN

## 2021-05-20 MED ORDER — FENTANYL CITRATE (PF) 100 MCG/2ML IJ SOLN
INTRAMUSCULAR | Status: AC
  Filled 2021-05-20: qty 2

## 2021-05-20 MED ORDER — LACTATED RINGERS IR SOLN: Status: DC | PRN

## 2021-05-20 MED ORDER — ROCURONIUM BROMIDE 10 MG/ML (PF) SYRINGE
PREFILLED_SYRINGE | INTRAVENOUS | Status: AC
  Filled 2021-05-20: qty 10

## 2021-05-20 MED ORDER — ACETAMINOPHEN 500 MG PO TABS
ORAL_TABLET | ORAL | Status: AC
  Administered 2021-05-20: 1000 mg via ORAL
  Filled 2021-05-20: qty 2

## 2021-05-20 MED ORDER — LIDOCAINE HCL (CARDIAC) PF 100 MG/5ML IV SOSY
PREFILLED_SYRINGE | INTRAVENOUS | Status: DC | PRN
  Administered 2021-05-20: 100 mg via INTRAVENOUS

## 2021-05-20 MED ORDER — CEFAZOLIN SODIUM-DEXTROSE 2-4 GM/100ML-% IV SOLN
INTRAVENOUS | Status: AC
  Filled 2021-05-20: qty 100

## 2021-05-20 MED ORDER — 0.9 % SODIUM CHLORIDE (POUR BTL) OPTIME
TOPICAL | Status: DC | PRN
  Administered 2021-05-20: 1000 mL

## 2021-05-20 MED ORDER — LIDOCAINE HCL (PF) 2 % IJ SOLN
INTRAMUSCULAR | Status: AC
  Filled 2021-05-20: qty 5

## 2021-05-20 MED ORDER — BUPIVACAINE HCL (PF) 0.25 % IJ SOLN
INTRAMUSCULAR | Status: AC
  Filled 2021-05-20: qty 30

## 2021-05-20 MED ORDER — DEXAMETHASONE SODIUM PHOSPHATE 10 MG/ML IJ SOLN
INTRAMUSCULAR | Status: AC
  Filled 2021-05-20: qty 1

## 2021-05-20 MED ORDER — NEOMYCIN-POLYMYXIN B GU 40-200000 IR SOLN
Status: DC | PRN
  Administered 2021-05-20: 2 mL

## 2021-05-20 MED ORDER — ONDANSETRON HCL 4 MG/2ML IJ SOLN
INTRAMUSCULAR | Status: DC | PRN
  Administered 2021-05-20: 4 mg via INTRAVENOUS

## 2021-05-20 MED ORDER — NEOMYCIN-POLYMYXIN B GU 40-200000 IR SOLN
Status: AC
  Filled 2021-05-20: qty 1

## 2021-05-20 MED ORDER — FENTANYL CITRATE PF 50 MCG/ML IJ SOSY: 50.0000 ug | PREFILLED_SYRINGE | INTRAMUSCULAR | Status: DC | PRN

## 2021-05-20 MED ORDER — SUGAMMADEX SODIUM 500 MG/5ML IV SOLN
INTRAVENOUS | Status: DC | PRN
  Administered 2021-05-20: 250 mg via INTRAVENOUS

## 2021-05-20 MED ORDER — PHENYLEPHRINE 40 MCG/ML (10ML) SYRINGE FOR IV PUSH (FOR BLOOD PRESSURE SUPPORT): PREFILLED_SYRINGE | INTRAVENOUS | Status: DC | PRN

## 2021-05-20 MED ORDER — BUPIVACAINE LIPOSOME 1.3 % IJ SUSP
INTRAMUSCULAR | Status: DC | PRN
  Administered 2021-05-20: 13 mL via PERINEURAL
  Administered 2021-05-20: 7 mL via PERINEURAL

## 2021-05-20 MED ORDER — BUPIVACAINE LIPOSOME 1.3 % IJ SUSP
INTRAMUSCULAR | Status: AC
  Filled 2021-05-20: qty 20

## 2021-05-20 MED ORDER — ORAL CARE MOUTH RINSE: 15.0000 mL | Freq: Once | OROMUCOSAL | Status: AC

## 2021-05-20 MED ORDER — PROPOFOL 1000 MG/100ML IV EMUL
INTRAVENOUS | Status: AC
  Filled 2021-05-20: qty 100

## 2021-05-20 MED ORDER — ROCURONIUM BROMIDE 100 MG/10ML IV SOLN
INTRAVENOUS | Status: DC | PRN
  Administered 2021-05-20: 30 mg via INTRAVENOUS
  Administered 2021-05-20: 50 mg via INTRAVENOUS

## 2021-05-20 MED ORDER — LIDOCAINE HCL (PF) 1 % IJ SOLN
INTRAMUSCULAR | Status: AC
  Filled 2021-05-20: qty 30

## 2021-05-20 SURGICAL SUPPLY — 71 items
ADAPTER IRRIG TUBE 2 SPIKE SOL (ADAPTER) ×4 IMPLANT
ADPR TBG 2 SPK PMP STRL ASCP (ADAPTER) ×2
ANCH SUT 5.5 KNTLS PEEK (Orthopedic Implant) ×2 IMPLANT
ANCH SUT Q-FX 2.8 (Anchor) ×2 IMPLANT
ANCHOR ALL-SUT Q-FIX 2.8 (Anchor) ×4 IMPLANT
ANCHOR SUT 5.5 MULTIFIX (Orthopedic Implant) ×4 IMPLANT
BLADE SHAVER 4.5X7 STR FR (MISCELLANEOUS) IMPLANT
CANISTER SUCT LVC 12 LTR MEDI- (MISCELLANEOUS) IMPLANT
CANNULA 5.75X7 CRYSTAL CLEAR (CANNULA) ×2 IMPLANT
CANNULA PARTIAL THREAD 2X7 (CANNULA) ×2 IMPLANT
CANNULA TWIST IN 8.25X9CM (CANNULA) ×2 IMPLANT
CONNECTOR PERFECT PASSER (CONNECTOR) ×4 IMPLANT
COOLER POLAR GLACIER W/PUMP (MISCELLANEOUS) ×2 IMPLANT
DEVICE SUCT BLK HOLE OR FLOOR (MISCELLANEOUS) ×4 IMPLANT
DRAPE 3/4 80X56 (DRAPES) ×2 IMPLANT
DRAPE IMP U-DRAPE 54X76 (DRAPES) ×4 IMPLANT
DRAPE INCISE IOBAN 66X45 STRL (DRAPES) ×2 IMPLANT
DRAPE U-SHAPE 47X51 STRL (DRAPES) ×2 IMPLANT
DURAPREP 26ML APPLICATOR (WOUND CARE) ×4 IMPLANT
ELECT REM PT RETURN 9FT ADLT (ELECTROSURGICAL) ×2
ELECTRODE REM PT RTRN 9FT ADLT (ELECTROSURGICAL) ×1 IMPLANT
GAUZE SPONGE 4X4 12PLY STRL (GAUZE/BANDAGES/DRESSINGS) ×2 IMPLANT
GAUZE XEROFORM 1X8 LF (GAUZE/BANDAGES/DRESSINGS) ×2 IMPLANT
GLOVE SURG ORTHO LTX SZ9 (GLOVE) ×6 IMPLANT
GLOVE SURG UNDER POLY LF SZ9 (GLOVE) ×2 IMPLANT
GOWN STRL REUS TWL 2XL XL LVL4 (GOWN DISPOSABLE) ×2 IMPLANT
GOWN STRL REUS W/ TWL LRG LVL3 (GOWN DISPOSABLE) ×1 IMPLANT
GOWN STRL REUS W/TWL LRG LVL3 (GOWN DISPOSABLE) ×2
IV LACTATED RINGER IRRG 3000ML (IV SOLUTION) ×12
IV LR IRRIG 3000ML ARTHROMATIC (IV SOLUTION) ×6 IMPLANT
KIT STABILIZATION SHOULDER (MISCELLANEOUS) IMPLANT
KIT SUTURE 2.8 Q-FIX DISP (MISCELLANEOUS) ×2 IMPLANT
KIT SUTURETAK 3.0 INSERT PERC (KITS) IMPLANT
KIT TURNOVER KIT A (KITS) ×2 IMPLANT
MANIFOLD NEPTUNE II (INSTRUMENTS) ×2 IMPLANT
MASK FACE SPIDER DISP (MASK) ×2 IMPLANT
MAT ABSORB  FLUID 56X50 GRAY (MISCELLANEOUS) ×2
MAT ABSORB FLUID 56X50 GRAY (MISCELLANEOUS) ×2 IMPLANT
NDL SAFETY ECLIPSE 18X1.5 (NEEDLE) ×2 IMPLANT
NEEDLE HYPO 18GX1.5 SHARP (NEEDLE) ×4
NEEDLE HYPO 22GX1.5 SAFETY (NEEDLE) ×2 IMPLANT
NS IRRIG 500ML POUR BTL (IV SOLUTION) ×2 IMPLANT
PACK ARTHROSCOPY SHOULDER (MISCELLANEOUS) ×2 IMPLANT
PAD ARMBOARD 7.5X6 YLW CONV (MISCELLANEOUS) ×4 IMPLANT
PAD WRAPON POLAR SHDR XLG (MISCELLANEOUS) ×1 IMPLANT
PASSER SUT FIRSTPASS SELF (INSTRUMENTS) ×2 IMPLANT
SHAVER BLADE BONE CUTTER 4.5 (BLADE) ×2 IMPLANT
SHAVER BLADE TAPERED BLUNT 4 (BLADE) IMPLANT
SPONGE T-LAP 18X18 ~~LOC~~+RFID (SPONGE) ×2 IMPLANT
STRIP CLOSURE SKIN 1/2X4 (GAUZE/BANDAGES/DRESSINGS) ×2 IMPLANT
SUT ETHILON 4-0 (SUTURE) ×2
SUT ETHILON 4-0 FS2 18XMFL BLK (SUTURE) ×1
SUT LASSO 90 DEG SD STR (SUTURE) IMPLANT
SUT MNCRL 4-0 (SUTURE) ×2
SUT MNCRL 4-0 27XMFL (SUTURE) ×1
SUT PDS AB 0 CT1 27 (SUTURE) ×6 IMPLANT
SUT PERFECTPASSER WHITE CART (SUTURE) ×4 IMPLANT
SUT SMART STITCH CARTRIDGE (SUTURE) ×4 IMPLANT
SUT ULTRABRAID 2 COBRAID 38 (SUTURE) IMPLANT
SUT VIC AB 0 CT1 36 (SUTURE) ×6 IMPLANT
SUT VIC AB 2-0 CT2 27 (SUTURE) ×2 IMPLANT
SUTURE ETHLN 4-0 FS2 18XMF BLK (SUTURE) ×1 IMPLANT
SUTURE MNCRL 4-0 27XMF (SUTURE) ×1 IMPLANT
SYR 10ML LL (SYRINGE) ×2 IMPLANT
TAPE MICROFOAM 4IN (TAPE) ×2 IMPLANT
TUBING CONNECTING 10 (TUBING) ×2 IMPLANT
TUBING INFLOW SET DBFLO PUMP (TUBING) ×2 IMPLANT
TUBING OUTFLOW SET DBLFO PUMP (TUBING) ×2 IMPLANT
WAND WEREWOLF FLOW 90D (MISCELLANEOUS) ×2 IMPLANT
WATER STERILE IRR 500ML POUR (IV SOLUTION) ×2 IMPLANT
WRAPON POLAR PAD SHDR XLG (MISCELLANEOUS) ×2

## 2021-05-20 NOTE — Op Note (Signed)
05/20/2021  3:52 PM  PATIENT:  Thomas Mercado  65 y.o. male  PRE-OPERATIVE DIAGNOSIS:  Left Shoulder Rotator Cuff Tear, subacromial impingement and acromioclavicular joint arthrosis.  POST-OPERATIVE DIAGNOSIS: Same  PROCEDURE: Left shoulder arthroscopic subacromial decompression, distal clavicle excision and mini open rotator cuff repair.  SURGEON:  Surgeon(s) and Role:    * Thornton Park, MD - Primary  ANESTHESIA:   general and paracervical block   PREOPERATIVE INDICATIONS:  Thomas Mercado is a  65 y.o. male with a diagnosis of Left Shoulder Rotator Cuff Tear failed conservative treatment and elected for surgical management.    The risks benefits and alternatives were discussed with the patient preoperatively including but not limited to the risks of infection, bleeding, nerve injury, persistent pain or weakness, shoulder stiffness/arthrofibrosis, failure of the repair, re-tear of the rotator cuff and the need for further surgery. Medical risks include DVT and pulmonary embolism, myocardial infarction, stroke, pneumonia, respiratory failure and death. Patient understood these risks and wished to proceed.  OPERATIVE IMPLANTS: [employer] MultiFix anchors x 2 & [employer]hew Q Fix anchors x 2  OPERATIVE FINDINGS: Left shoulder full-thickness rotator cuff tear involving the supraspinatus.  Previous long head of the biceps rupture.  Subacromial spurring with impingement and AC joint arthrosis  OPERATIVE PROCEDURE: The patient was met in the preoperative area. The left shoulder shoulder was signed with the word yes and my initials according the hospital's correct site of surgery protocol.   A pre-op history and physical was performed at the bedside.   Patient underwent an interscalene block with Exparel by the anesthesia service.  Patient was brought to the operating room where the underwent general endotracheal intubation.  The patient was placed in a beachchair position.  A spider arm  positioner was used for this case.  Examination under anesthesia revealed no loss of passive range of motion or instability with load shift testing. The patient had a negative sulcus sign.  Patient was prepped and draped in a sterile fashion. A timeout was performed to verify the patient's name, date of birth, medical record number, correct site of surgery and correct procedure to be performed there was also used to verify the patient received antibiotics that all appropriate instruments, implants and radiographs studies were available in the room. Once all in attendance were in agreement case began.  Patient received ancef 2 grams IV for pre-op antibiotics.  Bony landmarks were drawn out with a surgical marker along with proposed arthroscopy incisions.  An 11 blade was used to establish a posterior portal through which the arthroscope was placed in the glenohumeral joint. A full diagnostic examination of the shoulder was performed.  Findings are noted above.  The arthroscope was then placed in the subacromial space.  Extensive bursitis was encountered and debrided using a 4-0 resector shaver blade and a 90 ArthroCare wand from a lateral portal which was established under direct visualization using an 18-gauge spinal needle. A subacromial decompression was performed sing a 5.5 mm resector shaver blade from the lateral portal.   A distal clavicle excision was then performed through the anterior portal also using the 5.5 mm resector shaver blade.  A 5.5 mm resector shaver blade was then used to debride the greater tuberosity of all torn fibers of the rotator cuff.   Two Perfect Pass sutures were placed in the lateral border of the rotator cuff tear. All arthroscopic instruments were then removed and the mini-open portion of the procedure began.  A saber-type incision  was made along the lateral border of the acromion. The deltoid muscle was identified and split in line with its fibers which allowed  visualization of the rotator cuff. The Perfect Pass sutures previously placed in the lateral border of the rotator cuff were brought out through the deltoid split.  Two Smith and [employer] Fix anchors were placed at the articular margin of the humeral head with the greater tuberosity. The suture limbs of the Q Fix anchors were passed medially through the rotator cuff using a First Pass suture passer.  Two additional perfect pass sutures were then placed through the lateral border the rotator cuff.  The four Perfect Pass sutures were then anchored to the greater tuberosity footprint using two [employer] Multifix anchors. The sutures passed through the Multifix anchors were then tensioned to allow reduction of the rotator cuff to the greater tuberosity footprint. The medial row repair was then performed using the Q fix sutures, which were tied down using an arthroscopic knot tying technique.  Arthroscopic images of the repair were taken with the arthroscope both externally and from inside the glenohumeral joint.  All incisions were copiously irrigated. The deltoid fascia was repaired using a 0 Vicryl suture.  The subcutaneous tissue of all incisions were closed with a 2-0 Vicryl. Skin closure for the arthroscopic incisions was performed with 4-0 nylon. The skin edges of the saber incision was approximated with a running 4-0 undyed Monocryl.  A dry sterile dressing was applied.  The patient was placed in an abduction sling and a Polar Care was applied to the shoulder.  All sharp, sponge and it instrument counts were correct at the conclusion of the case. I was scrubbed and present for the entire case. I spoke with the patient's wife postoperatively to let her know the case had been performed without complication and the patient was stable in recovery room.  I reviewed the postoperative instructions with her.

## 2021-05-20 NOTE — Anesthesia Procedure Notes (Signed)
Procedure Name: Intubation Date/Time: 05/20/2021 1:33 PM Performed by: Johnna Acosta, CRNA Pre-anesthesia Checklist: Patient identified, Emergency Drugs available, Suction available, Patient being monitored and Timeout performed Patient Re-evaluated:Patient Re-evaluated prior to induction Oxygen Delivery Method: Circle system utilized Preoxygenation: Pre-oxygenation with 100% oxygen Induction Type: IV induction Ventilation: Mask ventilation with difficulty and Oral airway inserted - appropriate to patient size Laryngoscope Size: McGraph and 4 Grade View: Grade I Tube type: Oral Tube size: 7.5 mm Number of attempts: 1 Airway Equipment and Method: Stylet and Video-laryngoscopy Placement Confirmation: ETT inserted through vocal cords under direct vision, positive ETCO2 and breath sounds checked- equal and bilateral Secured at: 24 cm Tube secured with: Tape Dental Injury: Teeth and Oropharynx as per pre-operative assessment

## 2021-05-20 NOTE — Anesthesia Preprocedure Evaluation (Addendum)
Anesthesia Evaluation  Patient identified by MRN, date of birth, ID band Patient awake    Reviewed: Allergy & Precautions, NPO status , Patient's Chart, lab work & pertinent test results  History of Anesthesia Complications Negative for: history of anesthetic complications  Airway Mallampati: III  TM Distance: <3 FB Neck ROM: full    Dental  (+) Chipped, Poor Dentition, Caps   Pulmonary neg shortness of breath, sleep apnea and Continuous Positive Airway Pressure Ventilation ,    Pulmonary exam normal        Cardiovascular Exercise Tolerance: Good hypertension, Normal cardiovascular exam+ dysrhythmias Atrial Fibrillation      Neuro/Psych negative neurological ROS  negative psych ROS   GI/Hepatic Neg liver ROS, GERD  Medicated and Controlled,  Endo/Other  negative endocrine ROS  Renal/GU      Musculoskeletal   Abdominal   Peds  Hematology negative hematology ROS (+)   Anesthesia Other Findings Past Medical History: No date: Allergy 07/31/2017: Aortic atherosclerosis (HCC)     Comment:  Chest x-ray February 2019 No date: Atrial fibrillation [facility])     [facility] No date: Atrial fibrillation [facility]) No date: CPAP (continuous positive airway pressure) dependence No date: Dysrhythmia No date: ED (erectile dysfunction) No date: GERD (gastroesophageal reflux disease) No date: Heart murmur No date: Hyperlipidemia No date: Hypertension No date: Sleep apnea No date: Sleep apnea  Past Surgical History: Sept 2014: ABLATION     Comment:  heart 12/2015: ABLATION     Comment:  heart 10/23/2015: COLONOSCOPY WITH PROPOFOL; N/A     Comment:  Procedure: COLONOSCOPY WITH PROPOFOL;  Surgeon: Lollie Sails, MD;  Location: [facility] ENDOSCOPY;  Service:               Endoscopy;  Laterality: N/A; 04/09/2015: KNEE ARTHROSCOPY; Left     Comment:  Procedure: ,left knee arthroscopy,  limited synovectomy               and partial medial menisectomy.;  Surgeon: Thornton Park, MD;  Location: [facility] ORS;  Service:               Orthopedics;  Laterality: Left; No date: VARICOCELE EXCISION  BMI    Body Mass Index: 29.83 kg/m      Reproductive/Obstetrics negative OB ROS                             Anesthesia Physical Anesthesia Plan  ASA: 3  Anesthesia Plan: General ETT   Post-op Pain Management: Regional block   Induction: Intravenous  PONV Risk Score and Plan: Ondansetron, Dexamethasone, Midazolam and Treatment may vary due to age or medical condition  Airway Management Planned: Oral ETT  Additional Equipment:   Intra-op Plan:   Post-operative Plan: Extubation in OR  Informed Consent: I have reviewed the patients History and Physical, chart, labs and discussed the procedure including the risks, benefits and alternatives for the proposed anesthesia with the patient or authorized representative who has indicated his/her understanding and acceptance.     Dental Advisory Given  Plan Discussed with: Anesthesiologist, CRNA and Surgeon  Anesthesia Plan Comments: (Patient consented for risks of anesthesia including but not limited to:  - adverse reactions to medications - damage to eyes, teeth, lips or other oral mucosa -  nerve damage due to positioning  - sore throat or hoarseness - Damage to heart, brain, nerves, lungs, other parts of body or loss of life  Patient voiced understanding.)       Anesthesia Quick Evaluation

## 2021-05-20 NOTE — Transfer of Care (Signed)
Immediate Anesthesia Transfer of Care Note  Patient: Thomas Mercado  Procedure(s) Performed: SHOULDER ARTHROSCOPY WITH OPEN ROTATOR CUFF REPAIR AND DISTAL CLAVICLE ACROMINECTOMY (Left: Shoulder) SHOULDER ARTHROSCOPY WITH SUBACROMIAL DECOMPRESSION AND DISTAL CLAVICLE EXCISION (Left: Shoulder)  Patient Location: PACU  Anesthesia Type:General  Level of Consciousness: awake and drowsy  Airway & Oxygen Therapy: Patient Spontanous Breathing and Patient connected to face mask oxygen  Post-op Assessment: Report given to RN and Post -op Vital signs reviewed and stable  Post vital signs: Reviewed and stable  Last Vitals:  Vitals Value Taken Time  BP 120/76 05/20/21 1545  Temp    Pulse 64 05/20/21 1549  Resp 15 05/20/21 1549  SpO2 99 % 05/20/21 1549  Vitals shown include unvalidated device data.  Last Pain:  Vitals:   05/20/21 0943  TempSrc: Oral  PainSc: 0-No pain         Complications: No notable events documented.

## 2021-05-20 NOTE — H&P (Signed)
PREOPERATIVE H&P  Chief Complaint: Left Shoulder Rotator Cuff Tear  HPI: Thomas Mercado is a 65 y.o. male who presents for preoperative history and physical with a diagnosis of Left Shoulder Rotator Cuff Tear confirmed by MRI. Symptoms of pain and weakness are significantly impairing activities of daily living.  Patient's failed nonoperative management wished to proceed with surgical fixation of his left shoulder supraspinatus tear.  Past Medical History:  Diagnosis Date   Allergy    Aortic atherosclerosis ([place]) 07/31/2017   Chest x-ray February 2019   [facility]   Atrial fibrillation [facility])    CPAP (continuous positive airway pressure) dependence    Dysrhythmia    ED (erectile dysfunction)    GERD (gastroesophageal reflux disease)    Heart murmur    Hyperlipidemia    Hypertension    Sleep apnea    Sleep apnea    Past Surgical History:  Procedure Laterality Date   ABLATION  Sept 2014   heart   ABLATION  12/2015   heart   COLONOSCOPY WITH PROPOFOL N/A 10/23/2015   Procedure: COLONOSCOPY WITH PROPOFOL;  Surgeon: Lollie Sails, MD;  Location: [facility] ENDOSCOPY;  Service: Endoscopy;  Laterality: N/A;   KNEE ARTHROSCOPY Left 04/09/2015   Procedure: ,left knee arthroscopy, limited synovectomy and partial medial menisectomy.;  Surgeon: Thornton Park, MD;  Location: [facility] ORS;  Service: Orthopedics;  Laterality: Left;   VARICOCELE EXCISION     Social History   Socioeconomic History   Marital status: Married    Spouse name: Not on file   Number of children: Not on file   Years of education: Not on file   Highest education level: Not on file  Occupational History   Occupation: full time    Employer: [employer]  Tobacco Use   Smoking status: Never   Smokeless tobacco: Never  Vaping Use   Vaping Use: Never used  Substance and Sexual Activity   Alcohol use: Yes    Alcohol/week: 0.0 standard drinks    Comment: on  occasion with beer or liquor   Drug use: No   Sexual activity: Yes  Other Topics Concern   Not on file  Social History Narrative   Not on file   Social Determinants of Health   Financial Resource Strain: Low Risk    Difficulty of Paying Living Expenses: Not hard at all  Food Insecurity: No Food Insecurity   Worried About Charity fundraiser in the Last Year: Never true   [place] in the Last Year: Never true  Transportation Needs: No Transportation Needs   Lack of Transportation (Medical): No   Lack of Transportation (Non-Medical): No  Physical Activity: Sufficiently Active   Days of Exercise per Week: 7 days   Minutes of Exercise per Session: 60 min  Stress: No Stress Concern Present   Feeling of Stress : Not at all  Social Connections: Moderately Integrated   Frequency of Communication with Friends and Family: More than three times a week   Frequency of Social Gatherings with Friends and Family: Once a week   Attends Religious Services: Never   Marine scientist or Organizations: Yes   Attends Music therapist: More than 4 times per year   Marital Status: Married   Family History  Problem Relation Age of Onset   Breast cancer Mother    Alzheimer's disease Father    Hypertension Father  Arthritis Father    Heart disease Brother    Emphysema Brother    Allergies  Allergen Reactions   Alpha-Gal Diarrhea    Aches and pains    Penicillin G Benzathine Rash    As a child   Prior to Admission medications   Medication Sig Start Date End Date Taking? Authorizing Provider  acetaminophen (TYLENOL) 500 MG tablet Take 1,000 mg by mouth every 6 (six) hours as needed.   Yes [provider]  aspirin EC 81 MG tablet Take 81 mg by mouth daily.   Yes [provider]  benazepril (LOTENSIN) 20 MG tablet Take 1 tablet (20 mg total) by mouth daily. Patient taking differently: Take 10 mg by mouth daily. 01/07/21  Yes Jon Billings, NP   cetirizine (ZYRTEC) 10 MG tablet Take 1 tablet (10 mg total) by mouth daily. 01/11/21  Yes Jon Billings, NP  diclofenac (VOLTAREN) 75 MG EC tablet Take 1 tablet (75 mg total) by mouth 2 (two) times daily as needed. 01/07/21  Yes Jon Billings, NP  fluorouracil (EFUDEX) 5 % cream Apply 1 application topically 2 (two) times daily.   Yes [provider]  fluticasone (FLONASE) 50 MCG/ACT nasal spray Place 2 sprays into both nostrils daily. Patient taking differently: Place 1 spray into both nostrils daily as needed for allergies. 06/30/20  Yes Johnson, Megan P, DO  Probiotic Product (PROBIOTIC PO) Take 1 capsule by mouth daily.   Yes [provider]  rosuvastatin (CRESTOR) 5 MG tablet Take 1 tablet (5 mg total) by mouth daily. 04/05/21  Yes Jon Billings, NP  pantoprazole (PROTONIX) 40 MG tablet Take 1 tablet (40 mg total) by mouth in the morning and at bedtime. 01/07/21 05/17/21  Jon Billings, NP     Positive ROS: All other systems have been reviewed and were otherwise negative with the exception of those mentioned in the HPI and as above.  Physical Exam: General: Alert, no acute distress Cardiovascular: Regular rate and rhythm, no murmurs rubs or gallops.  No pedal edema Respiratory: Clear to auscultation bilaterally, no wheezes rales or rhonchi. No cyanosis, no use of accessory musculature GI: No organomegaly, abdomen is soft and non-tender nondistended with positive bowel sounds. Skin: Skin intact, no lesions within the operative field. Neurologic: Sensation intact distally Psychiatric: Patient is competent for consent with normal mood and affect Lymphatic: No cervical lymphadenopathy  MUSCULOSKELETAL: Left Shoulder: The patient's skin is intact. There is no erythema, ecchymosis, or effusion. He has a Popeye deformity. He can forward elevate and abduct to approximately 150 degrees, but has pain in the mid range of abduction and full forward elevation. He has  pain with impingement signs, but no apprehension or instability. He demonstrates mild weakness to shoulder abduction and external rotation. He has full digital, wrist, and elbow range of motion, intact sensation to light touch, and a palpable radial pulse.   Radiology: I have reviewed the patient's MR arthrogram images as well as radiology report. The patient has a full-thickness, but incomplete tear of the superior aspect of the supraspinatus with retraction. The patient has subscapularis tendinosis without tear. The long head of the biceps tendon was not visualized and is torn and retracted. The patient has an abnormal appearance of the posterior superior labrum.   Assessment: Left Shoulder Rotator Cuff Tear  Plan: Plan for Procedure(s): LEFT SHOULDER ARTHROSCOPIC SUBACROMIAL DECOMPRESSION, DISTAL CLAVICLE EXCISION WITH MINI OPEN ROTATOR CUFF REPAIR A  I reviewed the details of the operation as well as the postoperative  course with the patient.  I discussed the risks and benefits of surgery. The risks include but are not limited to infection, bleeding, nerve or blood vessel injury, joint stiffness or loss of motion, persistent pain, weakness or instability, retear of the rotator cuff, failure of the repair and the need for further surgery. Medical risks include but are not limited to DVT and pulmonary embolism, myocardial infarction, stroke, pneumonia, respiratory failure and death. Patient understood these risks and wished to proceed.     Thornton Park, MD   05/20/2021 1:21 PM

## 2021-05-20 NOTE — Anesthesia Procedure Notes (Signed)
Anesthesia Regional Block: Interscalene brachial plexus block   Pre-Anesthetic Checklist: , timeout performed,  Correct Patient, Correct Site, Correct Laterality,  Correct Procedure, Correct Position, site marked,  Risks and benefits discussed,  Surgical consent,  Pre-op evaluation,  At surgeon's request and post-op pain management  Laterality: Upper and Left  Prep: chloraprep       Needles:  Injection technique: Single-shot  Needle Type: Stimiplex     Needle Length: 9cm  Needle Gauge: 22     Additional Needles:   Procedures:,,,, ultrasound used (permanent image in chart),,    Narrative:  Start time: 05/20/2021 10:52 AM End time: 05/20/2021 10:55 AM Injection made incrementally with aspirations every 5 mL.  Performed by: Personally  Anesthesiologist: Mubarak Bevens, Precious Haws, MD  Additional Notes: Patient consented for risk and benefits of nerve block including but not limited to nerve damage, failed block, bleeding and infection.  Patient voiced understanding.  Functioning IV was confirmed and monitors were applied.  Timeout done prior to procedure and prior to any sedation being given to the patient.  Patient confirmed procedure site prior to any sedation given to the patient.  A 64mm 22ga Stimuplex needle was used. Sterile prep,hand hygiene and sterile gloves were used.  Minimal sedation used for procedure.  No paresthesia endorsed by patient during the procedure.  Negative aspiration and negative test dose prior to incremental administration of local anesthetic. The patient tolerated the procedure well with no immediate complications.

## 2021-05-20 NOTE — Discharge Instructions (Addendum)
AMBULATORY SURGERY  DISCHARGE INSTRUCTIONS   The drugs that you were given will stay in your system until tomorrow so for the next 24 hours you should not:  Drive an automobile Make any legal decisions Drink any alcoholic beverage   You may resume regular meals tomorrow.  Today it is better to start with liquids and gradually work up to solid foods.  You may eat anything you prefer, but it is better to start with liquids, then soup and crackers, and gradually work up to solid foods.   Please notify your doctor immediately if you have any unusual bleeding, trouble breathing, redness and pain at the surgery site, drainage, fever, or pain not relieved by medication.     Additional Instructions:        Interscalene Nerve Block with Exparel   For your surgery you have received an Interscalene Nerve Block with Exparel. Nerve Blocks affect many types of nerves, including nerves that control movement, pain and normal sensation.  You may experience feelings such as numbness, tingling, heaviness, weakness or the inability to move your arm or the feeling or sensation that your arm has "fallen asleep". A nerve block with Exparel can last up to 5 days.  Usually the weakness wears off first.  The tingling and heaviness usually wear off next.  Finally you may start to notice pain.  Keep in mind that this may occur in any order.  Once a nerve block starts to wear off it is usually completely gone within 60 minutes. ISNB may cause mild shortness of breath, a hoarse voice, blurry vision, unequal pupils, or drooping of the face on the same side as the nerve block.  These symptoms will usually resolve with the numbness.  Very rarely the procedure itself can cause mild seizures. If needed, your surgeon will give you a prescription for pain medication.  It will take about 60 minutes for the oral pain medication to become fully effective.  So, it is recommended that you start taking this medication before the  nerve block first begins to wear off, or when you first begin to feel discomfort. Take your pain medication only as prescribed.  Pain medication can cause sedation and decrease your breathing if you take more than you need for the level of pain that you have. Nausea is a common side effect of many pain medications.  You may want to eat something before taking your pain medicine to prevent nausea. After an Interscalene nerve block, you cannot feel pain, pressure or extremes in temperature in the effected arm.  Because your arm is numb it is at an increased risk for injury.  To decrease the possibility of injury, please practice the following:  While you are awake change the position of your arm frequently to prevent too much pressure on any one area for prolonged periods of time.  If you have a cast or tight dressing, check the color or your fingers every couple of hours.  Call your surgeon with the appearance of any discoloration (white or blue). If you are given a sling to wear before you go home, please wear it  at all times until the block has completely worn off.  Do not get up at night without your sling. Please contact [place] [facility] or your surgeon if you do not begin to regain sensation after 7 days from the surgery.  Anesthesia may be contacted by calling the Same Day Surgery Department, Mon. through Fri., 6 am to 4 pm at  [phone].   If you experience any other problems or concerns, please contact your surgeon's office. If you experience severe or prolonged shortness of breath go to the nearest emergency department.

## 2021-05-21 ENCOUNTER — Encounter: Payer: Self-pay | Admitting: Orthopedic Surgery

## 2021-05-21 NOTE — Anesthesia Postprocedure Evaluation (Signed)
Anesthesia Post Note  Patient: Thomas Mercado  Procedure(s) Performed: SHOULDER ARTHROSCOPY WITH OPEN ROTATOR CUFF REPAIR AND DISTAL CLAVICLE ACROMINECTOMY (Left: Shoulder) SHOULDER ARTHROSCOPY WITH SUBACROMIAL DECOMPRESSION AND DISTAL CLAVICLE EXCISION (Left: Shoulder)  Patient location during evaluation: PACU Anesthesia Type: General Level of consciousness: awake and alert Pain management: pain level controlled Vital Signs Assessment: post-procedure vital signs reviewed and stable Respiratory status: spontaneous breathing, nonlabored ventilation, respiratory function stable and patient connected to nasal cannula oxygen Cardiovascular status: blood pressure returned to baseline and stable Postop Assessment: no apparent nausea or vomiting Anesthetic complications: no   No notable events documented.   Last Vitals:  Vitals:   05/20/21 1600 05/20/21 1622  BP: 112/76 118/80  Pulse: 69 67  Resp: (!) 22 16  Temp: (!) 36.3 C (!) 36.1 C  SpO2: 95% 97%    Last Pain:  Vitals:   05/20/21 1622  TempSrc: Temporal  PainSc: 0-No pain                 Precious Haws [place]

## 2021-05-23 ENCOUNTER — Other Ambulatory Visit: Payer: Self-pay | Admitting: Nurse Practitioner

## 2021-05-24 MED ORDER — PANTOPRAZOLE SODIUM 40 MG PO TBEC: 40.0000 mg | DELAYED_RELEASE_TABLET | Freq: Two times a day (BID) | ORAL | 0 refills | Status: DC

## 2021-05-31 DIAGNOSIS — M25512 Pain in left shoulder: Secondary | ICD-10-CM | POA: Diagnosis not present

## 2021-06-02 DIAGNOSIS — Z91018 Allergy to other foods: Secondary | ICD-10-CM | POA: Diagnosis not present

## 2021-06-02 DIAGNOSIS — Z91014 Allergy to mammalian meats: Secondary | ICD-10-CM | POA: Diagnosis not present

## 2021-06-02 DIAGNOSIS — L501 Idiopathic urticaria: Secondary | ICD-10-CM | POA: Diagnosis not present

## 2021-06-02 DIAGNOSIS — K5229 Other allergic and dietetic gastroenteritis and colitis: Secondary | ICD-10-CM | POA: Diagnosis not present

## 2021-06-03 DIAGNOSIS — M25512 Pain in left shoulder: Secondary | ICD-10-CM | POA: Diagnosis not present

## 2021-06-09 DIAGNOSIS — M25512 Pain in left shoulder: Secondary | ICD-10-CM | POA: Diagnosis not present

## 2021-06-11 DIAGNOSIS — M25512 Pain in left shoulder: Secondary | ICD-10-CM | POA: Diagnosis not present

## 2021-06-15 DIAGNOSIS — M25512 Pain in left shoulder: Secondary | ICD-10-CM | POA: Diagnosis not present

## 2021-06-17 DIAGNOSIS — M25512 Pain in left shoulder: Secondary | ICD-10-CM | POA: Diagnosis not present

## 2021-06-23 DIAGNOSIS — M25512 Pain in left shoulder: Secondary | ICD-10-CM | POA: Diagnosis not present

## 2021-06-25 DIAGNOSIS — M25512 Pain in left shoulder: Secondary | ICD-10-CM | POA: Diagnosis not present

## 2021-06-29 DIAGNOSIS — M25512 Pain in left shoulder: Secondary | ICD-10-CM | POA: Diagnosis not present

## 2021-07-01 DIAGNOSIS — M25512 Pain in left shoulder: Secondary | ICD-10-CM | POA: Diagnosis not present

## 2021-07-06 DIAGNOSIS — M25512 Pain in left shoulder: Secondary | ICD-10-CM | POA: Diagnosis not present

## 2021-07-08 DIAGNOSIS — M25512 Pain in left shoulder: Secondary | ICD-10-CM | POA: Diagnosis not present

## 2021-07-09 NOTE — Progress Notes (Signed)
BP 101/70    Pulse 76    Temp 97.9 F (36.6 C) (Oral)    Ht 6' 2.49" (1.892 m)    Wt 251 lb (113.9 kg)    SpO2 97%    BMI 31.81 kg/m    Subjective:    Patient ID: Thomas Mercado, male    DOB: October 13, 1955, 66 y.o.   MRN: [id number]  HPI: Thomas Mercado is a 66 y.o. male  Chief Complaint  Patient presents with   Diabetes   Hypertension   Hyperlipidemia   Labwork    Patient states that he wants to be retested for [place] / [place] Satisfied with current treatment? yes Duration of hypertension: years BP monitoring frequency: not checking BP range:  BP medication side effects: no Past BP meds: benazepril Duration of hyperlipidemia: years Cholesterol medication side effects: no Cholesterol supplements: none Past cholesterol medications: rosuvastatin (crestor) Medication compliance: excellent compliance Aspirin: no Recent stressors: no Recurrent headaches: no Visual changes: no Palpitations: no Dyspnea: no Chest pain: no Lower extremity edema: no Dizzy/lightheaded: no  Patient states he had rotator cuff surgery December 1st.  He was taken out of the sling last week and seems like the pain has worsened since coming out it.   Patient would like to be rested for alpha gal.  Levels were right on the low side.  He would like to see if they have dropped lower.  Dr. Maisie Fus at [facility] is who he see's for alpha gal. Phone number: [phone].   Relevant past medical, surgical, family and social history reviewed and updated as indicated. Interim medical history since our last visit reviewed. Allergies and medications reviewed and updated.  Review of Systems  Eyes:  Negative for visual disturbance.  Respiratory:  Negative for chest tightness and shortness of breath.   Cardiovascular:  Negative for chest pain, palpitations and leg swelling.  Neurological:  Negative for dizziness, light-headedness and headaches.   Per HPI unless specifically indicated above      Objective:    BP 101/70    Pulse 76    Temp 97.9 F (36.6 C) (Oral)    Ht 6' 2.49" (1.892 m)    Wt 251 lb (113.9 kg)    SpO2 97%    BMI 31.81 kg/m   Wt Readings from Last 3 Encounters:  07/12/21 251 lb (113.9 kg)  05/20/21 235 lb 7.2 oz (106.8 kg)  02/09/21 240 lb 12.8 oz (109.2 kg)    Physical Exam Vitals and nursing note reviewed.  Constitutional:      General: He is not in acute distress.    Appearance: Normal appearance. He is not ill-appearing, toxic-appearing or diaphoretic.  HENT:     Head: Normocephalic.     Right Ear: External ear normal.     Left Ear: External ear normal.     Nose: Nose normal. No congestion or rhinorrhea.     Mouth/Throat:     Mouth: Mucous membranes are moist.  Eyes:     General:        Right eye: No discharge.        Left eye: No discharge.     Extraocular Movements: Extraocular movements intact.     Conjunctiva/sclera: Conjunctivae normal.     Pupils: Pupils are equal, round, and reactive to light.  Cardiovascular:     Rate and Rhythm: Normal rate and regular rhythm.     Heart sounds: No murmur heard. Pulmonary:     Effort: Pulmonary effort is  normal. No respiratory distress.     Breath sounds: Normal breath sounds. No wheezing, rhonchi or rales.  Abdominal:     General: Abdomen is flat. Bowel sounds are normal.  Musculoskeletal:     Cervical back: Normal range of motion and neck supple.  Skin:    General: Skin is warm and dry.     Capillary Refill: Capillary refill takes less than 2 seconds.  Neurological:     General: No focal deficit present.     Mental Status: He is alert and oriented to person, place, and time.  Psychiatric:        Mood and Affect: Mood normal.        Behavior: Behavior normal.        Thought Content: Thought content normal.        Judgment: Judgment normal.    Results for orders placed or performed during the hospital encounter of 05/17/21  CBC per protocol  Result Value Ref Range   WBC 6.2 4.0 - 10.5 K/uL    RBC 4.51 4.22 - 5.81 MIL/uL   Hemoglobin 13.7 13.0 - 17.0 g/dL   HCT 40.1 39.0 - 52.0 %   MCV 88.9 80.0 - 100.0 fL   MCH 30.4 26.0 - 34.0 pg   MCHC 34.2 30.0 - 36.0 g/dL   RDW 12.8 11.5 - 15.5 %   Platelets 198 150 - 400 K/uL   nRBC 0.0 0.0 - 0.2 %  Basic metabolic panel per protocol  Result Value Ref Range   Sodium 137 135 - 145 mmol/L   Potassium 4.0 3.5 - 5.1 mmol/L   Chloride 105 98 - 111 mmol/L   CO2 25 22 - 32 mmol/L   Glucose, Bld 76 70 - 99 mg/dL   BUN 22 8 - 23 mg/dL   Creatinine, Ser 0.92 0.61 - 1.24 mg/dL   Calcium 9.3 8.9 - 10.3 mg/dL   GFR, Estimated >60 >60 mL/min   Anion gap 7 5 - 15      Assessment & Plan:   Problem List Items Addressed This Visit       Cardiovascular and Mediastinum   Essential hypertension    Chronic.  Controlled.  Continue with current medication regimen on Benzapril $RemoveBefo'10mg'GdyjKkjtJCu$ .  Refill sent today.  Labs ordered today.  Return to clinic in 6 months for reevaluation.  Call sooner if concerns arise.        Relevant Medications   benazepril (LOTENSIN) 10 MG tablet   rosuvastatin (CRESTOR) 10 MG tablet   Other Relevant Orders   Comp Met (CMET)   Lipid Profile   Aortic atherosclerosis ([place]) - Primary    Will increase Crestor to $RemoveBe'10mg'jiJYoghVh$  daily.  Side effects and benefits discussed during visit.  Follow up in 6 months. Call sooner if concerns arise.       Relevant Medications   benazepril (LOTENSIN) 10 MG tablet   rosuvastatin (CRESTOR) 10 MG tablet   Paroxysmal atrial fibrillation (HCC)    Chronic.  Controlled.  Continue with current medication regimen.  Labs ordered today.  Return to clinic in 6 months for reevaluation.  Call sooner if concerns arise.        Relevant Medications   benazepril (LOTENSIN) 10 MG tablet   rosuvastatin (CRESTOR) 10 MG tablet     Other   Hyperlipidemia    Chronic.  Controlled.  Crestor increased to $RemoveBefo'10mg'QOlFnftVGEG$  daily. Labs ordered today.  Return to clinic in 6 months for reevaluation.  Call sooner if concerns arise.  Relevant Medications   benazepril (LOTENSIN) 10 MG tablet   rosuvastatin (CRESTOR) 10 MG tablet   Other Relevant Orders   Lipid Profile   Other Visit Diagnoses     Allergy to alpha-gal       Labs ordered today. Patient follows up with specialist at [facility].  Will make recommendations based on lab results.    Relevant Orders   Alpha-Gal Panel        Follow up plan: Return in about 6 months (around 01/09/2022) for Physical and Fasting labs.

## 2021-07-12 ENCOUNTER — Encounter: Payer: Self-pay | Admitting: Nurse Practitioner

## 2021-07-12 ENCOUNTER — Ambulatory Visit (INDEPENDENT_AMBULATORY_CARE_PROVIDER_SITE_OTHER): Payer: Medicare HMO | Admitting: Nurse Practitioner

## 2021-07-12 ENCOUNTER — Other Ambulatory Visit: Payer: Self-pay

## 2021-07-12 VITALS — BP 101/70 | HR 76 | Temp 97.9°F | Ht 74.49 in | Wt 251.0 lb

## 2021-07-12 DIAGNOSIS — I480 Paroxysmal atrial fibrillation: Secondary | ICD-10-CM | POA: Diagnosis not present

## 2021-07-12 DIAGNOSIS — Z91018 Allergy to other foods: Secondary | ICD-10-CM

## 2021-07-12 DIAGNOSIS — I700 Atherosclerosis of aorta: Secondary | ICD-10-CM | POA: Diagnosis not present

## 2021-07-12 DIAGNOSIS — I10 Essential (primary) hypertension: Secondary | ICD-10-CM

## 2021-07-12 DIAGNOSIS — E785 Hyperlipidemia, unspecified: Secondary | ICD-10-CM

## 2021-07-12 MED ORDER — ROSUVASTATIN CALCIUM 10 MG PO TABS: 5.0000 mg | ORAL_TABLET | Freq: Every day | ORAL | 1 refills | Status: DC

## 2021-07-12 MED ORDER — BENAZEPRIL HCL 10 MG PO TABS: 10.0000 mg | ORAL_TABLET | Freq: Every day | ORAL | 1 refills | Status: DC

## 2021-07-12 NOTE — Assessment & Plan Note (Signed)
Chronic.  Controlled.  Continue with current medication regimen.  Labs ordered today.  Return to clinic in 6 months for reevaluation.  Call sooner if concerns arise.  ? ?

## 2021-07-12 NOTE — Assessment & Plan Note (Signed)
Will increase Crestor to 10mg  daily.  Side effects and benefits discussed during visit.  Follow up in 6 months. Call sooner if concerns arise.

## 2021-07-12 NOTE — Assessment & Plan Note (Signed)
Chronic.  Controlled.  Crestor increased to 10mg daily. Labs ordered today.  Return to clinic in 6 months for reevaluation.  Call sooner if concerns arise.   

## 2021-07-12 NOTE — Assessment & Plan Note (Signed)
Chronic.  Controlled.  Continue with current medication regimen on Benzapril 10mg .  Refill sent today.  Labs ordered today.  Return to clinic in 6 months for reevaluation.  Call sooner if concerns arise.

## 2021-07-13 DIAGNOSIS — M25512 Pain in left shoulder: Secondary | ICD-10-CM | POA: Diagnosis not present

## 2021-07-14 LAB — COMPREHENSIVE METABOLIC PANEL
ALT: 19 IU/L (ref 0–44)
AST: 15 IU/L (ref 0–40)
Albumin/Globulin Ratio: 2.1 (ref 1.2–2.2)
Albumin: 4.7 g/dL (ref 3.8–4.8)
Alkaline Phosphatase: 81 IU/L (ref 44–121)
BUN/Creatinine Ratio: 22 (ref 10–24)
BUN: 20 mg/dL (ref 8–27)
Bilirubin Total: 0.6 mg/dL (ref 0.0–1.2)
CO2: 26 mmol/L (ref 20–29)
Calcium: 9.8 mg/dL (ref 8.6–10.2)
Chloride: 105 mmol/L (ref 96–106)
Creatinine, Ser: 0.93 mg/dL (ref 0.76–1.27)
Globulin, Total: 2.2 g/dL (ref 1.5–4.5)
Glucose: 101 mg/dL — ABNORMAL HIGH (ref 70–99)
Potassium: 4.7 mmol/L (ref 3.5–5.2)
Sodium: 141 mmol/L (ref 134–144)
Total Protein: 6.9 g/dL (ref 6.0–8.5)
eGFR: 91 mL/min/{1.73_m2} (ref >59)

## 2021-07-14 LAB — ALPHA-GAL PANEL
Allergen Lamb IgE: 0.32 kU/L — AB
Beef IgE: 1.24 kU/L — AB
IgE (Immunoglobulin E), Serum: 38 IU/mL (ref 6–495)
O215-IgE Alpha-Gal: 1.42 kU/L — AB
Pork IgE: 0.49 kU/L — AB

## 2021-07-14 LAB — LIPID PANEL
Chol/HDL Ratio: 3.3 ratio (ref 0.0–5.0)
Cholesterol, Total: 145 mg/dL (ref 100–199)
HDL: 44 mg/dL (ref >39)
LDL Chol Calc (NIH): 82 mg/dL (ref 0–99)
Triglycerides: 104 mg/dL (ref 0–149)
VLDL Cholesterol Cal: 19 mg/dL (ref 5–40)

## 2021-07-14 NOTE — Progress Notes (Signed)
HI Thomas Mercado.  Your lab work looks good.  We will continue to monitor.  Please see your Alpha Gal results and follow up with your specialist.  Please let me know if you have any questions.

## 2021-07-15 DIAGNOSIS — M25512 Pain in left shoulder: Secondary | ICD-10-CM | POA: Diagnosis not present

## 2021-07-16 DIAGNOSIS — I951 Orthostatic hypotension: Secondary | ICD-10-CM | POA: Diagnosis not present

## 2021-07-16 DIAGNOSIS — Z008 Encounter for other general examination: Secondary | ICD-10-CM | POA: Diagnosis not present

## 2021-07-16 DIAGNOSIS — J302 Other seasonal allergic rhinitis: Secondary | ICD-10-CM | POA: Diagnosis not present

## 2021-07-16 DIAGNOSIS — L309 Dermatitis, unspecified: Secondary | ICD-10-CM | POA: Diagnosis not present

## 2021-07-16 DIAGNOSIS — I10 Essential (primary) hypertension: Secondary | ICD-10-CM | POA: Diagnosis not present

## 2021-07-16 DIAGNOSIS — E669 Obesity, unspecified: Secondary | ICD-10-CM | POA: Diagnosis not present

## 2021-07-16 DIAGNOSIS — E785 Hyperlipidemia, unspecified: Secondary | ICD-10-CM | POA: Diagnosis not present

## 2021-07-16 DIAGNOSIS — H269 Unspecified cataract: Secondary | ICD-10-CM | POA: Diagnosis not present

## 2021-07-16 DIAGNOSIS — L570 Actinic keratosis: Secondary | ICD-10-CM | POA: Diagnosis not present

## 2021-07-16 DIAGNOSIS — N529 Male erectile dysfunction, unspecified: Secondary | ICD-10-CM | POA: Diagnosis not present

## 2021-07-16 DIAGNOSIS — K219 Gastro-esophageal reflux disease without esophagitis: Secondary | ICD-10-CM | POA: Diagnosis not present

## 2021-07-16 DIAGNOSIS — M1990 Unspecified osteoarthritis, unspecified site: Secondary | ICD-10-CM | POA: Diagnosis not present

## 2021-07-16 DIAGNOSIS — G4733 Obstructive sleep apnea (adult) (pediatric): Secondary | ICD-10-CM | POA: Diagnosis not present

## 2021-07-20 DIAGNOSIS — M25512 Pain in left shoulder: Secondary | ICD-10-CM | POA: Diagnosis not present

## 2021-07-22 DIAGNOSIS — M25512 Pain in left shoulder: Secondary | ICD-10-CM | POA: Diagnosis not present

## 2021-07-27 DIAGNOSIS — M25512 Pain in left shoulder: Secondary | ICD-10-CM | POA: Diagnosis not present

## 2021-07-29 DIAGNOSIS — M25512 Pain in left shoulder: Secondary | ICD-10-CM | POA: Diagnosis not present

## 2021-08-03 DIAGNOSIS — M25512 Pain in left shoulder: Secondary | ICD-10-CM | POA: Diagnosis not present

## 2021-08-05 DIAGNOSIS — M25512 Pain in left shoulder: Secondary | ICD-10-CM | POA: Diagnosis not present

## 2021-08-09 ENCOUNTER — Other Ambulatory Visit: Payer: Self-pay | Admitting: Nurse Practitioner

## 2021-08-09 DIAGNOSIS — I10 Essential (primary) hypertension: Secondary | ICD-10-CM

## 2021-08-09 MED ORDER — BENAZEPRIL HCL 10 MG PO TABS: 10.0000 mg | ORAL_TABLET | Freq: Every day | ORAL | 1 refills | Status: DC

## 2021-08-09 NOTE — Telephone Encounter (Signed)
Requested medication (s) are due for refill today: Yes  Requested medication (s) are on the active medication list: Yes  Last refill:  07/12/21  Future visit scheduled: Yes  Notes to clinic:  Will need new Rx sent for 1 pill per day per last OV note 07/12/21 and #100 per insurance request, see notes.  Aortic atherosclerosis (HCC) - Primary       Will increase Crestor to 10mg  daily.  Side effects and benefits discussed during visit.  Follow up in 6 months. Call sooner if concerns arise.          Requested Prescriptions  Pending Prescriptions Disp Refills   rosuvastatin (CRESTOR) 10 MG tablet 90 tablet 1    Sig: Take 0.5 tablets (5 mg total) by mouth daily.     Cardiovascular:  Antilipid - Statins 2 Failed - 08/09/2021 11:51 AM      Failed - Lipid Panel in normal range within the last 12 months    Cholesterol, Total  Date Value Ref Range Status  07/12/2021 145 100 - 199 mg/dL Final   Cholesterol  Date Value Ref Range Status  09/01/2012 142 0 - 200 mg/dL Final   Cholesterol Piccolo, Waived  Date Value Ref Range Status  05/01/2017 194 <200 mg/dL Final    Comment:                            Desirable                <200                         Borderline High      200- 239                         High                     >239    Ldl Cholesterol, Calc  Date Value Ref Range Status  09/01/2012 90 0 - 100 mg/dL Final   LDL Chol Calc (NIH)  Date Value Ref Range Status  07/12/2021 82 0 - 99 mg/dL Final   HDL Cholesterol  Date Value Ref Range Status  09/01/2012 29 (L) 40 - 60 mg/dL Final   HDL  Date Value Ref Range Status  07/12/2021 44 >39 mg/dL Final   Triglycerides  Date Value Ref Range Status  07/12/2021 104 0 - 149 mg/dL Final  09/01/2012 116 0 - 200 mg/dL Final   Triglycerides Piccolo,Waived  Date Value Ref Range Status  05/01/2017 123 <150 mg/dL Final    Comment:                            Normal                   <150                         Borderline  High     150 - 199                         High                200 - 499  Very High                >499          Passed - Cr in normal range and within 360 days    Creatinine  Date Value Ref Range Status  09/01/2012 1.19 0.60 - 1.30 mg/dL Final   Creatinine, Ser  Date Value Ref Range Status  07/12/2021 0.93 0.76 - 1.27 mg/dL Final          Passed - Patient is not pregnant      Passed - Valid encounter within last 12 months    Recent Outpatient Visits           4 weeks ago Aortic atherosclerosis ([place])   [facility] Jon Billings, NP   6 months ago Diarrhea, unspecified type   [place], Lauren A, NP   7 months ago Annual physical exam   [facility] Jon Billings, NP   7 months ago Insect bite of left hand, initial encounter   [facility] Jon Billings, NP   1 year ago Essential hypertension   [place], [place], DO       Future Appointments             In 5 months Jon Billings, NP [facility], [facility]            Signed Prescriptions Disp Refills   benazepril (LOTENSIN) 10 MG tablet 100 tablet 1    Sig: Take 1 tablet (10 mg total) by mouth daily.     Cardiovascular:  ACE Inhibitors Passed - 08/09/2021 11:51 AM      Passed - Cr in normal range and within 180 days    Creatinine  Date Value Ref Range Status  09/01/2012 1.19 0.60 - 1.30 mg/dL Final   Creatinine, Ser  Date Value Ref Range Status  07/12/2021 0.93 0.76 - 1.27 mg/dL Final          Passed - K in normal range and within 180 days    Potassium  Date Value Ref Range Status  07/12/2021 4.7 3.5 - 5.2 mmol/L Final  09/01/2012 3.9 3.5 - 5.1 mmol/L Final          Passed - Patient is not pregnant      Passed - Last BP in normal range    BP Readings from Last 1 Encounters:  07/12/21 101/70          Passed - Valid encounter within last 6 months    Recent  Outpatient Visits           4 weeks ago Aortic atherosclerosis ([place])   [place], NP   6 months ago Diarrhea, unspecified type   [place], NP   7 months ago Annual physical exam   [place], NP   7 months ago Insect bite of left hand, initial encounter   [facility] Jon Billings, NP   1 year ago Essential hypertension   [facility] Valerie Roys, DO       Future Appointments             In 5 months Jon Billings, NP [facility], [facility]               cf

## 2021-08-09 NOTE — Telephone Encounter (Signed)
Requested Prescriptions  Pending Prescriptions Disp Refills   benazepril (LOTENSIN) 10 MG tablet 100 tablet 1    Sig: Take 1 tablet (10 mg total) by mouth daily.     Cardiovascular:  ACE Inhibitors Passed - 08/09/2021 11:51 AM      Passed - Cr in normal range and within 180 days    Creatinine  Date Value Ref Range Status  09/01/2012 1.19 0.60 - 1.30 mg/dL Final   Creatinine, Ser  Date Value Ref Range Status  07/12/2021 0.93 0.76 - 1.27 mg/dL Final         Passed - K in normal range and within 180 days    Potassium  Date Value Ref Range Status  07/12/2021 4.7 3.5 - 5.2 mmol/L Final  09/01/2012 3.9 3.5 - 5.1 mmol/L Final         Passed - Patient is not pregnant      Passed - Last BP in normal range    BP Readings from Last 1 Encounters:  07/12/21 101/70         Passed - Valid encounter within last 6 months    Recent Outpatient Visits          4 weeks ago Aortic atherosclerosis ([place])   [facility] Jon Billings, NP   6 months ago Diarrhea, unspecified type   [facility] [place], Lauren A, NP   7 months ago Annual physical exam   [facility] Jon Billings, NP   7 months ago Insect bite of left hand, initial encounter   [facility] Jon Billings, NP   1 year ago Essential hypertension   [place], [place], DO      Future Appointments            In 5 months Jon Billings, NP [place], [facility]            rosuvastatin (CRESTOR) 10 MG tablet 90 tablet 1    Sig: Take 0.5 tablets (5 mg total) by mouth daily.     Cardiovascular:  Antilipid - Statins 2 Failed - 08/09/2021 11:51 AM      Failed - Lipid Panel in normal range within the last 12 months    Cholesterol, Total  Date Value Ref Range Status  07/12/2021 145 100 - 199 mg/dL Final   Cholesterol  Date Value Ref Range Status  09/01/2012 142 0 - 200 mg/dL Final   Cholesterol Piccolo, Waived  Date Value Ref  Range Status  05/01/2017 194 <200 mg/dL Final    Comment:                            Desirable                <200                         Borderline High      200- 239                         High                     >239    Ldl Cholesterol, Calc  Date Value Ref Range Status  09/01/2012 90 0 - 100 mg/dL Final   LDL Chol Calc (NIH)  Date Value Ref Range Status  07/12/2021 82 0 -  99 mg/dL Final   HDL Cholesterol  Date Value Ref Range Status  09/01/2012 29 (L) 40 - 60 mg/dL Final   HDL  Date Value Ref Range Status  07/12/2021 44 >39 mg/dL Final   Triglycerides  Date Value Ref Range Status  07/12/2021 104 0 - 149 mg/dL Final  09/01/2012 116 0 - 200 mg/dL Final   Triglycerides Piccolo,Waived  Date Value Ref Range Status  05/01/2017 123 <150 mg/dL Final    Comment:                            Normal                   <150                         Borderline High     150 - 199                         High                200 - 499                         Very High                >499          Passed - Cr in normal range and within 360 days    Creatinine  Date Value Ref Range Status  09/01/2012 1.19 0.60 - 1.30 mg/dL Final   Creatinine, Ser  Date Value Ref Range Status  07/12/2021 0.93 0.76 - 1.27 mg/dL Final         Passed - Patient is not pregnant      Passed - Valid encounter within last 12 months    Recent Outpatient Visits          4 weeks ago Aortic atherosclerosis ([place])   [facility] Jon Billings, NP   6 months ago Diarrhea, unspecified type   [place], Lauren A, NP   7 months ago Annual physical exam   [facility] Jon Billings, NP   7 months ago Insect bite of left hand, initial encounter   [facility] Jon Billings, NP   1 year ago Essential hypertension   [facility] Valerie Roys, DO      Future Appointments            In 5 months Jon Billings, NP  [facility], [place]

## 2021-08-09 NOTE — Telephone Encounter (Signed)
Provider increased Rosuvastatin at last OV, will need new Rx with this change. Insurance requesting #100 day supply.   07/12/21 OV note: Aortic atherosclerosis (HCC) - Primary       Will increase Crestor to 10mg  daily.  Side effects and benefits discussed during visit.  Follow up in 6 months. Call sooner if concerns arise.

## 2021-08-09 NOTE — Telephone Encounter (Signed)
°  rosuvastatin (CRESTOR) 10 MG tablet 90 tablet 1 07/12/2021    Sig - Route: Take 0.5 tablets (5 mg total) by mouth daily. - Oral   Sent to pharmacy as: rosuvastatin (CRESTOR) 10 MG tablet   E-Prescribing Status: Receipt confirmed by pharmacy (07/12/2021 10:12 AM EST   Pt ins calling regarding wanting Dr to know this med  qualifys for 100 day supply with Medicare, one med above what she stated that pt was taking differs from script states Mg is 5 mg vs 10mg  and she states she verified he was taking whole tablet. Calling to let dr know eligible for 100 day supply     Notification only this med qualifies at 100 day supply now   benazepril (LOTENSIN) 10 MG tablet 90 tablet 1 07/12/2021    Sig - Route: Take 1 tablet (10 mg total) by mouth daily. - Oral   Sent to pharmacy as: benazepril (LOTENSIN) 10 MG tablet   E-Prescribing Status: Receipt confirmed by pharmacy (07/12/2021 10:12 AM EST

## 2021-08-10 DIAGNOSIS — M25512 Pain in left shoulder: Secondary | ICD-10-CM | POA: Diagnosis not present

## 2021-08-12 DIAGNOSIS — M25512 Pain in left shoulder: Secondary | ICD-10-CM | POA: Diagnosis not present

## 2021-08-17 DIAGNOSIS — M25512 Pain in left shoulder: Secondary | ICD-10-CM | POA: Diagnosis not present

## 2021-08-24 DIAGNOSIS — M25512 Pain in left shoulder: Secondary | ICD-10-CM | POA: Diagnosis not present

## 2021-08-31 DIAGNOSIS — M25512 Pain in left shoulder: Secondary | ICD-10-CM | POA: Diagnosis not present

## 2021-09-07 DIAGNOSIS — M25512 Pain in left shoulder: Secondary | ICD-10-CM | POA: Diagnosis not present

## 2021-09-29 DIAGNOSIS — M75122 Complete rotator cuff tear or rupture of left shoulder, not specified as traumatic: Secondary | ICD-10-CM | POA: Diagnosis not present

## 2021-10-03 ENCOUNTER — Other Ambulatory Visit: Payer: Self-pay | Admitting: Nurse Practitioner

## 2021-10-04 NOTE — Telephone Encounter (Signed)
rx was sent to pharmacy on 07/12/21 #90/1. ? ?Requested Prescriptions  ?Pending Prescriptions Disp Refills  ?? rosuvastatin (CRESTOR) 5 MG tablet [Pharmacy Med Name: ROSUVASTATIN CALCIUM 5 MG TAB] 90 tablet 1  ?  Sig: TAKE ONE TABLET BY MOUTH DAILY  ?  ? Cardiovascular:  Antilipid - Statins 2 Failed - 10/03/2021  4:25 PM  ?  ?  Failed - Lipid Panel in normal range within the last 12 months  ?  Cholesterol, Total  ?Date Value Ref Range Status  ?07/12/2021 145 100 - 199 mg/dL Final  ? ?Cholesterol  ?Date Value Ref Range Status  ?09/01/2012 142 0 - 200 mg/dL Final  ? ?Cholesterol Piccolo, [place]  ?Date Value Ref Range Status  ?05/01/2017 194 <200 mg/dL Final  ?  Comment:  ?                          Desirable                <200 ?                        Borderline High      200- 239 ?                        High                     >239 ?  ? ?Ldl Cholesterol, Calc  ?Date Value Ref Range Status  ?09/01/2012 90 0 - 100 mg/dL Final  ? ?LDL Chol Calc (NIH)  ?Date Value Ref Range Status  ?07/12/2021 82 0 - 99 mg/dL Final  ? ?HDL Cholesterol  ?Date Value Ref Range Status  ?09/01/2012 29 (L) 40 - 60 mg/dL Final  ? ?HDL  ?Date Value Ref Range Status  ?07/12/2021 44 >39 mg/dL Final  ? ?Triglycerides  ?Date Value Ref Range Status  ?07/12/2021 104 0 - 149 mg/dL Final  ?09/01/2012 116 0 - 200 mg/dL Final  ? ?Triglycerides Piccolo,Waived  ?Date Value Ref Range Status  ?05/01/2017 123 <150 mg/dL Final  ?  Comment:  ?                          Normal                   <150 ?                        Borderline High     150 - 199 ?                        High                200 - 499 ?                        Very High                >499 ?  ? ?  ?  ?  Passed - Cr in normal range and within 360 days  ?  Creatinine  ?Date Value Ref Range Status  ?09/01/2012 1.19 0.60 - 1.30 mg/dL Final  ? ?Creatinine, Ser  ?Date Value Ref Range Status  ?07/12/2021 0.93 0.76 - 1.27 mg/dL Final  ?   ?  ?  Passed - Patient is not pregnant  ?  ?  Passed - Valid  encounter within last 12 months  ?  Recent Outpatient Visits   ?      ? 2 months ago Aortic atherosclerosis ([place])  ? [place], NP  ? 7 months ago Diarrhea, unspecified type  ? [place], Scheryl Darter, NP  ? 9 months ago Annual physical exam  ? [place], NP  ? 9 months ago Insect bite of left hand, initial encounter  ? [place], NP  ? 1 year ago Essential hypertension  ? [place], [place] P, DO  ?  ?  ?Future Appointments   ?        ? In 3 months Jon Billings, NP [facility], [facility]  ?  ? ?  ?  ?  ? ? ?

## 2021-10-26 DIAGNOSIS — Z85828 Personal history of other malignant neoplasm of skin: Secondary | ICD-10-CM | POA: Diagnosis not present

## 2021-10-26 DIAGNOSIS — L821 Other seborrheic keratosis: Secondary | ICD-10-CM | POA: Diagnosis not present

## 2021-10-26 DIAGNOSIS — L570 Actinic keratosis: Secondary | ICD-10-CM | POA: Diagnosis not present

## 2021-10-26 DIAGNOSIS — L720 Epidermal cyst: Secondary | ICD-10-CM | POA: Diagnosis not present

## 2021-10-26 DIAGNOSIS — L578 Other skin changes due to chronic exposure to nonionizing radiation: Secondary | ICD-10-CM | POA: Diagnosis not present

## 2021-10-26 DIAGNOSIS — Z859 Personal history of malignant neoplasm, unspecified: Secondary | ICD-10-CM | POA: Diagnosis not present

## 2021-11-14 ENCOUNTER — Other Ambulatory Visit: Payer: Self-pay | Admitting: Nurse Practitioner

## 2021-11-16 DIAGNOSIS — L720 Epidermal cyst: Secondary | ICD-10-CM | POA: Diagnosis not present

## 2021-11-16 NOTE — Telephone Encounter (Signed)
Requested medications are due for refill today.  yes  Requested medications are on the active medications list.  yes  Last refill. 12/52022 #180 0 refills  Future visit scheduled.   yes  Notes to clinic.  Rx written to expire 07/28/2021 - rx is expired    Requested Prescriptions  Pending Prescriptions Disp Refills   pantoprazole (PROTONIX) 40 MG tablet [Pharmacy Med Name: PANTOPRAZOLE SOD DR 40 MG TAB] 180 tablet 0    Sig: TAKE ONE TABLET BY MOUTH EVERY MORNING AND TAKE ONE TABLET BY MOUTH EVERY NIGHT AT BEDTIME     Gastroenterology: Proton Pump Inhibitors Passed - 11/14/2021  6:53 AM      Passed - Valid encounter within last 12 months    Recent Outpatient Visits           4 months ago Aortic atherosclerosis ([place])   [place], NP   9 months ago Diarrhea, unspecified type   [facility], Scheryl Darter, NP   10 months ago Annual physical exam   [place], NP   10 months ago Insect bite of left hand, initial encounter   [facility] Jon Billings, NP   1 year ago Essential hypertension   [facility] Valerie Roys, DO       Future Appointments             In 1 month Jon Billings, NP [facility], [place]

## 2021-12-10 DIAGNOSIS — M75122 Complete rotator cuff tear or rupture of left shoulder, not specified as traumatic: Secondary | ICD-10-CM | POA: Diagnosis not present

## 2021-12-15 ENCOUNTER — Other Ambulatory Visit: Payer: Self-pay | Admitting: Nurse Practitioner

## 2021-12-16 NOTE — Telephone Encounter (Signed)
Requested Prescriptions  Pending Prescriptions Disp Refills  . rosuvastatin (CRESTOR) 5 MG tablet [Pharmacy Med Name: ROSUVASTATIN CALCIUM 5 MG TAB] 90 tablet 0    Sig: TAKE ONE TABLET BY MOUTH DAILY     Cardiovascular:  Antilipid - Statins 2 Failed - 12/15/2021  9:00 PM      Failed - Lipid Panel in normal range within the last 12 months    Cholesterol, Total  Date Value Ref Range Status  07/12/2021 145 100 - 199 mg/dL Final   Cholesterol  Date Value Ref Range Status  09/01/2012 142 0 - 200 mg/dL Final   Cholesterol Piccolo, Waived  Date Value Ref Range Status  05/01/2017 194 <200 mg/dL Final    Comment:                            Desirable                <200                         Borderline High      200- 239                         High                     >239    Ldl Cholesterol, Calc  Date Value Ref Range Status  09/01/2012 90 0 - 100 mg/dL Final   LDL Chol Calc (NIH)  Date Value Ref Range Status  07/12/2021 82 0 - 99 mg/dL Final   HDL Cholesterol  Date Value Ref Range Status  09/01/2012 29 (L) 40 - 60 mg/dL Final   HDL  Date Value Ref Range Status  07/12/2021 44 >39 mg/dL Final   Triglycerides  Date Value Ref Range Status  07/12/2021 104 0 - 149 mg/dL Final  09/01/2012 116 0 - 200 mg/dL Final   Triglycerides Piccolo,Waived  Date Value Ref Range Status  05/01/2017 123 <150 mg/dL Final    Comment:                            Normal                   <150                         Borderline High     150 - 199                         High                200 - 499                         Very High                >499          Passed - Cr in normal range and within 360 days    Creatinine  Date Value Ref Range Status  09/01/2012 1.19 0.60 - 1.30 mg/dL Final   Creatinine, Ser  Date Value Ref Range Status  07/12/2021 0.93 0.76 - 1.27 mg/dL Final         Passed - Patient is not  pregnant      Passed - Valid encounter within last 12 months    Recent  Outpatient Visits          5 months ago Aortic atherosclerosis ([place])   [place], NP   10 months ago Diarrhea, unspecified type   [facility], Scheryl Darter, NP   11 months ago Annual physical exam   [place], NP   11 months ago Insect bite of left hand, initial encounter   [facility] Jon Billings, NP   1 year ago Essential hypertension   [facility] Valerie Roys, DO      Future Appointments            In 3 weeks Jon Billings, NP [facility], [place]

## 2021-12-28 DIAGNOSIS — E669 Obesity, unspecified: Secondary | ICD-10-CM | POA: Diagnosis not present

## 2021-12-28 DIAGNOSIS — G4733 Obstructive sleep apnea (adult) (pediatric): Secondary | ICD-10-CM | POA: Diagnosis not present

## 2021-12-28 DIAGNOSIS — I700 Atherosclerosis of aorta: Secondary | ICD-10-CM | POA: Diagnosis not present

## 2021-12-28 DIAGNOSIS — E7801 Familial hypercholesterolemia: Secondary | ICD-10-CM | POA: Diagnosis not present

## 2021-12-28 DIAGNOSIS — I10 Essential (primary) hypertension: Secondary | ICD-10-CM | POA: Diagnosis not present

## 2021-12-28 DIAGNOSIS — I480 Paroxysmal atrial fibrillation: Secondary | ICD-10-CM | POA: Diagnosis not present

## 2022-01-07 NOTE — Progress Notes (Signed)
BP 120/84   Pulse 89   Temp 97.7 F (36.5 C) (Oral)   Ht 6' 2.02" (1.88 m)   Wt 258 lb (117 kg)   SpO2 98%   BMI 33.11 kg/m    Subjective:    Patient ID: Thomas Mercado, male    DOB: 03-13-1956, 66 y.o.   MRN: [id number]  HPI: Thomas Mercado is a 66 y.o. male presenting on 01/10/2022 for comprehensive medical examination. Current medical complaints include:none  He currently lives with: Interim Problems from his last visit: None  HYPERTENSION / HYPERLIPIDEMIA Satisfied with current treatment? no Duration of hypertension: years BP monitoring frequency: a few times a month BP range: 120/80 BP medication side effects: no Past BP meds: benazepril Duration of hyperlipidemia: years Cholesterol medication side effects: no Cholesterol supplements: none Past cholesterol medications: none Medication compliance: excellent compliance Aspirin: yes Recent stressors: no Recurrent headaches: no Visual changes: no Palpitations: no Dyspnea: no Chest pain: no Lower extremity edema: no Dizzy/lightheaded: no  The 10-year ASCVD risk score (Arnett DK, et al., 2019) is: 13.5%   Values used to calculate the score:     Age: 79 years     Sex: Male     Is Non-Hispanic African American: No     Diabetic: No     Tobacco smoker: No     Systolic Blood Pressure: 120 mmHg     Is BP treated: Yes     HDL Cholesterol: 44 mg/dL     Total Cholesterol: 145 mg/dL   AFIB Patient states his rate has been controlled. Continues to follow up with Cardiology.  Denies concerns today. Just had an appt earlier this month.     Depression Screen done today and results listed below:     01/10/2022    8:38 AM 07/12/2021   10:05 AM 03/18/2021    4:30 PM 02/09/2021    8:30 AM 01/07/2021    8:06 AM  Depression screen PHQ 2/9  Decreased Interest 0 0 0 0 0  Down, Depressed, Hopeless 0 0 0 0 0  PHQ - 2 Score 0 0 0 0 0  Altered sleeping 1 2     Tired, decreased energy 1 0     Change in appetite 0 0     Feeling  bad or failure about yourself  0 0     Trouble concentrating 0 0     Moving slowly or fidgety/restless 0 0     Suicidal thoughts 0 0     PHQ-9 Score 2 2     Difficult doing work/chores Not difficult at all Not difficult at all       The patient does not have a history of falls. I did complete a risk assessment for falls. A plan of care for falls was documented.   Past Medical History:  Past Medical History:  Diagnosis Date  . Allergy   . Aortic atherosclerosis (HCC) 07/31/2017   Chest x-ray February 2019  . Atrial fibrillation [facility]  . Atrial fibrillation (HCC)   . CPAP (continuous positive airway pressure) dependence   . Dysrhythmia   . ED (erectile dysfunction)   . GERD (gastroesophageal reflux disease)   . Heart murmur   . Hyperlipidemia   . Hypertension   . Sleep apnea   . Sleep apnea     Surgical History:  Past Surgical History:  Procedure Laterality Date  . ABLATION  Sept 2014   heart  .  ABLATION  12/2015   heart  . COLONOSCOPY WITH PROPOFOL N/A 10/23/2015   Procedure: COLONOSCOPY WITH PROPOFOL;  Surgeon: Christena Deem, MD;  Location: [facility] ENDOSCOPY;  Service: Endoscopy;  Laterality: N/A;  . KNEE ARTHROSCOPY Left 04/09/2015   Procedure: ,left knee arthroscopy, limited synovectomy and partial medial menisectomy.;  Surgeon: Juanell Fairly, MD;  Location: [facility] ORS;  Service: Orthopedics;  Laterality: Left;  . SHOULDER ARTHROSCOPY WITH OPEN ROTATOR CUFF REPAIR AND DISTAL CLAVICLE ACROMINECTOMY Left 05/20/2021   Procedure: SHOULDER ARTHROSCOPY WITH OPEN ROTATOR CUFF REPAIR AND DISTAL CLAVICLE ACROMINECTOMY;  Surgeon: Juanell Fairly, MD;  Location: [facility] ORS;  Service: Orthopedics;  Laterality: Left;  Marland Kitchen VARICOCELE EXCISION      Medications:  Current Outpatient Medications on File Prior to Visit  Medication Sig  . acetaminophen (TYLENOL) 500 MG tablet Take 1,000 mg by mouth every 6 (six) hours as needed.  Marland Kitchen aspirin EC 81 MG  tablet Take 81 mg by mouth daily.  . cetirizine (ZYRTEC) 10 MG tablet Take 1 tablet (10 mg total) by mouth daily.  . fluorouracil (EFUDEX) 5 % cream Apply 1 application topically 2 (two) times daily.  . fluticasone (FLONASE) 50 MCG/ACT nasal spray Place 2 sprays into both nostrils daily. (Patient taking differently: Place 1 spray into both nostrils daily as needed for allergies.)  . pantoprazole (PROTONIX) 40 MG tablet TAKE ONE TABLET BY MOUTH EVERY MORNING AND TAKE ONE TABLET BY MOUTH EVERY NIGHT AT BEDTIME   No current facility-administered medications on file prior to visit.    Allergies:  Allergies  Allergen Reactions  . Alpha-Gal Diarrhea    Aches and pains   . Penicillin G Benzathine Rash    As a child    Social History:  Social History   Socioeconomic History  . Marital status: Married    Spouse name: Not on file  . Number of children: Not on file  . Years of education: Not on file  . Highest education level: Not on file  Occupational History  . Occupation: full time    Employer: [employer]  Tobacco Use  . Smoking status: Never  . Smokeless tobacco: Never  Vaping Use  . Vaping Use: Never used  Substance and Sexual Activity  . Alcohol use: Yes    Alcohol/week: 0.0 standard drinks of alcohol    Comment: on occasion with beer or liquor  . Drug use: No  . Sexual activity: Yes  Other Topics Concern  . Not on file  Social History Narrative  . Not on file   Social Determinants of Health   Financial Resource Strain: Low Risk  (03/18/2021)   Overall Financial Resource Strain (CARDIA)   . Difficulty of Paying Living Expenses: Not hard at all  Food Insecurity: No Food Insecurity (03/18/2021)   Hunger Vital Sign   . Worried About Programme researcher, broadcasting/film/video in the Last Year: Never true   . Ran Out of Food in the Last Year: Never true  Transportation Needs: No Transportation Needs (03/18/2021)   PRAPARE - Transportation   . Lack of Transportation (Medical): No   . Lack  of Transportation (Non-Medical): No  Physical Activity: Sufficiently Active (03/18/2021)   Exercise Vital Sign   . Days of Exercise per Week: 7 days   . Minutes of Exercise per Session: 60 min  Stress: No Stress Concern Present (03/18/2021)   [employer] of Occupational Health - Occupational Stress Questionnaire   . Feeling of Stress : Not at all  Social  Connections: Moderately Integrated (03/18/2021)   Social Connection and Isolation Panel [NHANES]   . Frequency of Communication with Friends and Family: More than three times a week   . Frequency of Social Gatherings with Friends and Family: Once a week   . Attends Religious Services: Never   . Active Member of Clubs or Organizations: Yes   . Attends Banker Meetings: More than 4 times per year   . Marital Status: Married  Catering manager Violence: Not At Risk (03/18/2021)   Humiliation, Afraid, Rape, and Kick questionnaire   . Fear of Current or Ex-Partner: No   . Emotionally Abused: No   . Physically Abused: No   . Sexually Abused: No   Social History   Tobacco Use  Smoking Status Never  Smokeless Tobacco Never   Social History   Substance and Sexual Activity  Alcohol Use Yes  . Alcohol/week: 0.0 standard drinks of alcohol   Comment: on occasion with beer or liquor    Family History:  Family History  Problem Relation Age of Onset  . Breast cancer Mother   . Alzheimer's disease Father   . Hypertension Father   . Arthritis Father   . Heart disease Brother   . Emphysema Brother     Past medical history, surgical history, medications, allergies, family history and social history reviewed with patient today and changes made to appropriate areas of the chart.   Review of Systems  Eyes:  Negative for blurred vision and double vision.  Respiratory:  Negative for shortness of breath.   Cardiovascular:  Negative for chest pain, palpitations and leg swelling.  Neurological:  Negative for dizziness and  headaches.   All other ROS negative except what is listed above and in the HPI.      Objective:    BP 120/84   Pulse 89   Temp 97.7 F (36.5 C) (Oral)   Ht 6' 2.02" (1.88 m)   Wt 258 lb (117 kg)   SpO2 98%   BMI 33.11 kg/m   Wt Readings from Last 3 Encounters:  01/10/22 258 lb (117 kg)  07/12/21 251 lb (113.9 kg)  05/20/21 235 lb 7.2 oz (106.8 kg)    Physical Exam Vitals and nursing note reviewed.  Constitutional:      General: He is not in acute distress.    Appearance: Normal appearance. He is not ill-appearing, toxic-appearing or diaphoretic.  HENT:     Head: Normocephalic.     Right Ear: Tympanic membrane, ear canal and external ear normal.     Left Ear: Tympanic membrane, ear canal and external ear normal.     Nose: Nose normal. No congestion or rhinorrhea.     Mouth/Throat:     Mouth: Mucous membranes are moist.  Eyes:     General:        Right eye: No discharge.        Left eye: No discharge.     Extraocular Movements: Extraocular movements intact.     Conjunctiva/sclera: Conjunctivae normal.     Pupils: Pupils are equal, round, and reactive to light.  Cardiovascular:     Rate and Rhythm: Normal rate and regular rhythm.     Heart sounds: No murmur heard. Pulmonary:     Effort: Pulmonary effort is normal. No respiratory distress.     Breath sounds: Normal breath sounds. No wheezing, rhonchi or rales.  Abdominal:     General: Abdomen is flat. Bowel sounds are normal. There is no  distension.     Palpations: Abdomen is soft.     Tenderness: There is no abdominal tenderness. There is no guarding.  Musculoskeletal:        General: No swelling, tenderness, deformity or signs of injury. Normal range of motion.     Cervical back: Normal range of motion and neck supple.  Skin:    General: Skin is warm and dry.     Capillary Refill: Capillary refill takes less than 2 seconds.  Neurological:     General: No focal deficit present.     Mental Status: He is alert and  oriented to person, place, and time.     Cranial Nerves: No cranial nerve deficit.     Motor: No weakness.     Deep Tendon Reflexes: Reflexes normal.  Psychiatric:        Mood and Affect: Mood normal.        Behavior: Behavior normal.        Thought Content: Thought content normal.        Judgment: Judgment normal.    Results for orders placed or performed in visit on 07/12/21  Comp Met (CMET)  Result Value Ref Range   Glucose 101 (H) 70 - 99 mg/dL   BUN 20 8 - 27 mg/dL   Creatinine, Ser 1.61 0.76 - 1.27 mg/dL   eGFR 91 >09 UE/AVW/0.98   BUN/Creatinine Ratio 22 10 - 24   Sodium 141 134 - 144 mmol/L   Potassium 4.7 3.5 - 5.2 mmol/L   Chloride 105 96 - 106 mmol/L   CO2 26 20 - 29 mmol/L   Calcium 9.8 8.6 - 10.2 mg/dL   Total Protein 6.9 6.0 - 8.5 g/dL   Albumin 4.7 3.8 - 4.8 g/dL   Globulin, Total 2.2 1.5 - 4.5 g/dL   Albumin/Globulin Ratio 2.1 1.2 - 2.2   Bilirubin Total 0.6 0.0 - 1.2 mg/dL   Alkaline Phosphatase 81 44 - 121 IU/L   AST 15 0 - 40 IU/L   ALT 19 0 - 44 IU/L  Lipid Profile  Result Value Ref Range   Cholesterol, Total 145 100 - 199 mg/dL   Triglycerides 119 0 - 149 mg/dL   HDL 44 >14 mg/dL   VLDL Cholesterol Cal 19 5 - 40 mg/dL   LDL Chol Calc (NIH) 82 0 - 99 mg/dL   Chol/HDL Ratio 3.3 0.0 - 5.0 ratio  Alpha-Gal Panel  Result Value Ref Range   Class Description Allergens Comment    IgE (Immunoglobulin E), Serum 38 6 - 495 IU/mL   [id number] Alpha-Gal 1.42 (A) Class III kU/L   Beef IgE 1.24 (A) Class II kU/L   Pork IgE 0.49 (A) Class I kU/L   Allergen Lamb IgE 0.32 (A) Class I kU/L      Assessment & Plan:   Problem List Items Addressed This Visit       Cardiovascular and Mediastinum   Essential hypertension    Chronic.  Controlled.  Continue with current medication regimen of Benzapril 5mg  daily.  Refill sent today. Labs ordered today.  Return to clinic in 6 months for reevaluation.  Call sooner if concerns arise.        Relevant Medications    benazepril (LOTENSIN) 5 MG tablet   rosuvastatin (CRESTOR) 10 MG tablet   Other Relevant Orders   Comprehensive metabolic panel   Aortic atherosclerosis (HCC)    Will increase Crestor to 10mg  daily.  Side effects and benefits discussed during visit.  Follow up in 6 months. Call sooner if concerns arise.  If not able to tolerate increase in dose will change back to Crestor 5mg .        Relevant Medications   benazepril (LOTENSIN) 5 MG tablet   rosuvastatin (CRESTOR) 10 MG tablet   Paroxysmal atrial fibrillation (HCC)    Chronic.  Controlled.  Continue with current medication regimen.  Labs ordered today.  Return to clinic in 6 months for reevaluation.  Call sooner if concerns arise.  Follow by Cardiology.  Last appt was July 2023.      Relevant Medications   benazepril (LOTENSIN) 5 MG tablet   rosuvastatin (CRESTOR) 10 MG tablet     Other   Hyperlipidemia    Chronic.  Controlled.  Crestor increased to 10mg  daily. Labs ordered today.  Return to clinic in 6 months for reevaluation.  Call sooner if concerns arise.        Relevant Medications   benazepril (LOTENSIN) 5 MG tablet   rosuvastatin (CRESTOR) 10 MG tablet   Other Relevant Orders   Lipid panel   Obesity (BMI 30-39.9)   Other Visit Diagnoses     Annual physical exam    -  Primary   Health maintenance reviewed during visit today. Labs ordered. Up to date on vaccines.     Relevant Orders   TSH   PSA   Lipid panel   CBC with Differential/Platelet   Comprehensive metabolic panel   Urinalysis, Routine w reflex microscopic   Allergy to alpha-gal       Labs checked at visit today.  WIll make recommendations based on lab results.    Relevant Orders   Alpha-Gal Panel        Discussed aspirin prophylaxis for myocardial infarction prevention and decision was made to continue ASA  LABORATORY TESTING:  Health maintenance labs ordered today as discussed above.   The natural history of prostate cancer and ongoing  controversy regarding screening and potential treatment outcomes of prostate cancer has been discussed with the patient. The meaning of a false positive PSA and a false negative PSA has been discussed. He indicates understanding of the limitations of this screening test and wishes to proceed with screening PSA testing.   IMMUNIZATIONS:   - Tdap: Tetanus vaccination status reviewed: last tetanus booster within 10 years. - Influenza: Up to date - Pneumovax: Up to date - Prevnar:  - HPV: Not applicable - Zostavax vaccine: Up to date  SCREENING: - Colonoscopy: Up to date  Discussed with patient purpose of the colonoscopy is to detect colon cancer at curable precancerous or early stages   - AAA Screening: Not applicable  -Hearing Test: Not applicable  -Spirometry: Not applicable   PATIENT COUNSELING:    Sexuality: Discussed sexually transmitted diseases, partner selection, use of condoms, avoidance of unintended pregnancy  and contraceptive alternatives.   Advised to avoid cigarette smoking.  I discussed with the patient that most people either abstain from alcohol or drink within safe limits (<=14/week and <=4 drinks/occasion for males, <=7/weeks and <= 3 drinks/occasion for females) and that the risk for alcohol disorders and other health effects rises proportionally with the number of drinks per week and how often a drinker exceeds daily limits.  Discussed cessation/primary prevention of drug use and availability of treatment for abuse.   Diet: Encouraged to adjust caloric intake to maintain  or achieve ideal body weight, to reduce intake of dietary saturated fat and total fat, to limit sodium intake by  avoiding high sodium foods and not adding table salt, and to maintain adequate dietary potassium and calcium preferably from fresh fruits, vegetables, and low-fat dairy products.    stressed the importance of regular exercise  Injury prevention: Discussed safety belts, safety helmets,  smoke detector, smoking near bedding or upholstery.   Dental health: Discussed importance of regular tooth brushing, flossing, and dental visits.   Follow up plan: NEXT PREVENTATIVE PHYSICAL DUE IN 1 YEAR. Return in about 6 months (around 07/13/2022) for HTN, HLD, DM2 FU.

## 2022-01-10 ENCOUNTER — Encounter: Payer: Self-pay | Admitting: Nurse Practitioner

## 2022-01-10 ENCOUNTER — Ambulatory Visit (INDEPENDENT_AMBULATORY_CARE_PROVIDER_SITE_OTHER): Payer: Medicare HMO | Admitting: Nurse Practitioner

## 2022-01-10 VITALS — BP 120/84 | HR 89 | Temp 97.7°F | Ht 74.02 in | Wt 258.0 lb

## 2022-01-10 DIAGNOSIS — Z0000 Encounter for general adult medical examination without abnormal findings: Secondary | ICD-10-CM | POA: Diagnosis not present

## 2022-01-10 DIAGNOSIS — Z91018 Allergy to other foods: Secondary | ICD-10-CM | POA: Diagnosis not present

## 2022-01-10 DIAGNOSIS — Z125 Encounter for screening for malignant neoplasm of prostate: Secondary | ICD-10-CM | POA: Diagnosis not present

## 2022-01-10 DIAGNOSIS — I700 Atherosclerosis of aorta: Secondary | ICD-10-CM | POA: Diagnosis not present

## 2022-01-10 DIAGNOSIS — I480 Paroxysmal atrial fibrillation: Secondary | ICD-10-CM | POA: Diagnosis not present

## 2022-01-10 DIAGNOSIS — I10 Essential (primary) hypertension: Secondary | ICD-10-CM

## 2022-01-10 DIAGNOSIS — E669 Obesity, unspecified: Secondary | ICD-10-CM | POA: Diagnosis not present

## 2022-01-10 DIAGNOSIS — E785 Hyperlipidemia, unspecified: Secondary | ICD-10-CM | POA: Diagnosis not present

## 2022-01-10 DIAGNOSIS — R8290 Unspecified abnormal findings in urine: Secondary | ICD-10-CM

## 2022-01-10 LAB — URINALYSIS, ROUTINE W REFLEX MICROSCOPIC
Bilirubin, UA: NEGATIVE
Glucose, UA: NEGATIVE
Ketones, UA: NEGATIVE
Leukocytes,UA: NEGATIVE
Nitrite, UA: NEGATIVE
Protein,UA: NEGATIVE
Specific Gravity, UA: >1.03 — ABNORMAL HIGH (ref 1.005–1.030)
Urobilinogen, Ur: 1 mg/dL (ref 0.2–1.0)
pH, UA: 6 (ref 5.0–7.5)

## 2022-01-10 LAB — MICROSCOPIC EXAMINATION
Bacteria, UA: NONE SEEN
RBC, Urine: >30 /hpf — ABNORMAL HIGH (ref 0–2)
WBC, UA: NONE SEEN /hpf (ref 0–5)

## 2022-01-10 MED ORDER — BENAZEPRIL HCL 5 MG PO TABS: 5.0000 mg | ORAL_TABLET | Freq: Every day | ORAL | 1 refills | Status: DC

## 2022-01-10 MED ORDER — ROSUVASTATIN CALCIUM 10 MG PO TABS: 10.0000 mg | ORAL_TABLET | Freq: Every day | ORAL | 1 refills | Status: DC

## 2022-01-10 NOTE — Assessment & Plan Note (Signed)
Chronic.  Controlled.  Continue with current medication regimen of Benzapril '5mg'$  daily.  Refill sent today. Labs ordered today.  Return to clinic in 6 months for reevaluation.  Call sooner if concerns arise.

## 2022-01-10 NOTE — Assessment & Plan Note (Addendum)
Will increase Crestor to '10mg'$  daily.  Side effects and benefits discussed during visit.  Follow up in 6 months. Call sooner if concerns arise.  If not able to tolerate increase in dose will change back to Crestor '5mg'$ .

## 2022-01-10 NOTE — Assessment & Plan Note (Signed)
Chronic.  Controlled.  Crestor increased to '10mg'$  daily. Labs ordered today.  Return to clinic in 6 months for reevaluation.  Call sooner if concerns arise.

## 2022-01-10 NOTE — Assessment & Plan Note (Signed)
Chronic.  Controlled.  Continue with current medication regimen.  Labs ordered today.  Return to clinic in 6 months for reevaluation.  Call sooner if concerns arise.  Follow by Cardiology.  Last appt was July 2023.

## 2022-01-10 NOTE — Addendum Note (Signed)
Addended by: Jon Billings on: 01/10/2022 09:19 AM   Modules accepted: Orders

## 2022-01-12 ENCOUNTER — Encounter: Payer: Self-pay | Admitting: Nurse Practitioner

## 2022-01-12 LAB — COMPREHENSIVE METABOLIC PANEL
ALT: 31 IU/L (ref 0–44)
AST: 23 IU/L (ref 0–40)
Albumin/Globulin Ratio: 2.5 — ABNORMAL HIGH (ref 1.2–2.2)
Albumin: 4.7 g/dL (ref 3.9–4.9)
Alkaline Phosphatase: 81 IU/L (ref 44–121)
BUN/Creatinine Ratio: 13 (ref 10–24)
BUN: 13 mg/dL (ref 8–27)
Bilirubin Total: 0.3 mg/dL (ref 0.0–1.2)
CO2: 22 mmol/L (ref 20–29)
Calcium: 9.7 mg/dL (ref 8.6–10.2)
Chloride: 104 mmol/L (ref 96–106)
Creatinine, Ser: 0.97 mg/dL (ref 0.76–1.27)
Globulin, Total: 1.9 g/dL (ref 1.5–4.5)
Glucose: 101 mg/dL — ABNORMAL HIGH (ref 70–99)
Potassium: 4.5 mmol/L (ref 3.5–5.2)
Sodium: 141 mmol/L (ref 134–144)
Total Protein: 6.6 g/dL (ref 6.0–8.5)
eGFR: 86 mL/min/{1.73_m2} (ref >59)

## 2022-01-12 LAB — LIPID PANEL
Chol/HDL Ratio: 4.6 ratio (ref 0.0–5.0)
Cholesterol, Total: 175 mg/dL (ref 100–199)
HDL: 38 mg/dL — ABNORMAL LOW (ref >39)
LDL Chol Calc (NIH): 115 mg/dL — ABNORMAL HIGH (ref 0–99)
Triglycerides: 121 mg/dL (ref 0–149)
VLDL Cholesterol Cal: 22 mg/dL (ref 5–40)

## 2022-01-12 LAB — ALPHA-GAL PANEL
Allergen Lamb IgE: 0.24 kU/L — AB
Beef IgE: 1.18 kU/L — AB
IgE (Immunoglobulin E), Serum: 52 IU/mL (ref 6–495)
O215-IgE Alpha-Gal: 1.49 kU/L — AB
Pork IgE: 0.61 kU/L — AB

## 2022-01-12 LAB — CBC WITH DIFFERENTIAL/PLATELET
Basophils Absolute: 0.1 10*3/uL (ref 0.0–0.2)
Basos: 2 %
EOS (ABSOLUTE): 0.2 10*3/uL (ref 0.0–0.4)
Eos: 5 %
Hematocrit: 42.7 % (ref 37.5–51.0)
Hemoglobin: 14.8 g/dL (ref 13.0–17.7)
Immature Grans (Abs): 0 10*3/uL (ref 0.0–0.1)
Immature Granulocytes: 0 %
Lymphocytes Absolute: 1.4 10*3/uL (ref 0.7–3.1)
Lymphs: 34 %
MCH: 30.9 pg (ref 26.6–33.0)
MCHC: 34.7 g/dL (ref 31.5–35.7)
MCV: 89 fL (ref 79–97)
Monocytes Absolute: 0.3 10*3/uL (ref 0.1–0.9)
Monocytes: 8 %
Neutrophils Absolute: 2.1 10*3/uL (ref 1.4–7.0)
Neutrophils: 51 %
Platelets: 176 10*3/uL (ref 150–450)
RBC: 4.79 x10E6/uL (ref 4.14–5.80)
RDW: 12.8 % (ref 11.6–15.4)
WBC: 4.1 10*3/uL (ref 3.4–10.8)

## 2022-01-12 LAB — TSH: TSH: 1.66 u[IU]/mL (ref 0.450–4.500)

## 2022-01-12 LAB — PSA: Prostate Specific Ag, Serum: 1.7 ng/mL (ref 0.0–4.0)

## 2022-01-12 LAB — URINE CULTURE: Organism ID, Bacteria: NO GROWTH

## 2022-01-12 NOTE — Progress Notes (Signed)
HI Thomas Mercado.  Your lab work shows that your cholesterol is slightly elevated from prior.  I recommend a low fat diet and exercise.  Your Alpha Gal is relatively the same as prior.  Other lab work is unremarkable.  We will continue to reassess them in the future.  Continue with your current medication regimen.  Follow up as discussed.

## 2022-01-13 NOTE — Progress Notes (Signed)
Please let patient know that higher the number the more sensitive he is to the beef and pork. The more he consumes the higher the more sensitive he can become.  If he still has questions, please let me know.

## 2022-02-01 ENCOUNTER — Other Ambulatory Visit: Payer: Self-pay | Admitting: Nurse Practitioner

## 2022-02-01 NOTE — Telephone Encounter (Signed)
Requested Prescriptions  Pending Prescriptions Disp Refills  . cetirizine (ZYRTEC) 10 MG tablet [Pharmacy Med Name: CETIRIZINE HCL 10 MG TABLET] 90 tablet 2    Sig: TAKE ONE TABLET BY MOUTH DAILY     Ear, Nose, and Throat:  Antihistamines 2 Passed - 02/01/2022  6:22 AM      Passed - Cr in normal range and within 360 days    Creatinine  Date Value Ref Range Status  09/01/2012 1.19 0.60 - 1.30 mg/dL Final   Creatinine, Ser  Date Value Ref Range Status  01/10/2022 0.97 0.76 - 1.27 mg/dL Final         Passed - Valid encounter within last 12 months    Recent Outpatient Visits          3 weeks ago Annual physical exam   [facility] Jon Billings, NP   6 months ago Aortic atherosclerosis ([place])   [facility] Jon Billings, NP   11 months ago Diarrhea, unspecified type   [facility] [place]yl Darter, NP   1 year ago Annual physical exam   [facility] Jon Billings, NP   1 year ago Insect bite of left hand, initial encounter   [facility] Jon Billings, NP      Future Appointments            In 5 months Jon Billings, NP [facility] [place]

## 2022-03-01 DIAGNOSIS — D485 Neoplasm of uncertain behavior of skin: Secondary | ICD-10-CM | POA: Diagnosis not present

## 2022-03-01 DIAGNOSIS — C4442 Squamous cell carcinoma of skin of scalp and neck: Secondary | ICD-10-CM | POA: Diagnosis not present

## 2022-03-08 NOTE — Progress Notes (Unsigned)
There were no vitals taken for this visit.   Subjective:    Patient ID: Thomas Mercado, male    DOB: 08/16/1955, 66 y.o.   MRN: [id number]  HPI: Thomas Mercado is a 66 y.o. male  No chief complaint on file.  UPPER RESPIRATORY TRACT INFECTION Worst symptom: Fever: {Blank single:19197::"yes","no"} Cough: {Blank single:19197::"yes","no"} Shortness of breath: {Blank single:19197::"yes","no"} Wheezing: {Blank single:19197::"yes","no"} Chest pain: {Blank single:19197::"yes","no","yes, with cough"} Chest tightness: {Blank single:19197::"yes","no"} Chest congestion: {Blank single:19197::"yes","no"} Nasal congestion: {Blank single:19197::"yes","no"} Runny nose: {Blank single:19197::"yes","no"} Post nasal drip: {Blank single:19197::"yes","no"} Sneezing: {Blank single:19197::"yes","no"} Sore throat: {Blank single:19197::"yes","no"} Swollen glands: {Blank single:19197::"yes","no"} Sinus pressure: {Blank single:19197::"yes","no"} Headache: {Blank single:19197::"yes","no"} Face pain: {Blank single:19197::"yes","no"} Toothache: {Blank single:19197::"yes","no"} Ear pain: {Blank single:19197::"yes","no"} {Blank single:19197::""right","left", "bilateral"} Ear pressure: {Blank single:19197::"yes","no"} {Blank single:19197::""right","left", "bilateral"} Eyes red/itching:{Blank single:19197::"yes","no"} Eye drainage/crusting: {Blank single:19197::"yes","no"}  Vomiting: {Blank single:19197::"yes","no"} Rash: {Blank single:19197::"yes","no"} Fatigue: {Blank single:19197::"yes","no"} Sick contacts: {Blank single:19197::"yes","no"} Strep contacts: {Blank single:19197::"yes","no"}  Context: {Blank multiple:19196::"better","worse","stable","fluctuating"} Recurrent sinusitis: {Blank single:19197::"yes","no"} Relief with OTC cold/cough medications: {Blank single:19197::"yes","no"}  Treatments attempted: {Blank multiple:19196::"none","cold/sinus","mucinex","anti-histamine","pseudoephedrine","cough  syrup","antibiotics"}   Relevant past medical, surgical, family and social history reviewed and updated as indicated. Interim medical history since our last visit reviewed. Allergies and medications reviewed and updated.  Review of Systems  Per HPI unless specifically indicated above     Objective:    There were no vitals taken for this visit.  Wt Readings from Last 3 Encounters:  01/10/22 258 lb (117 kg)  07/12/21 251 lb (113.9 kg)  05/20/21 235 lb 7.2 oz (106.8 kg)    Physical Exam  Results for orders placed or performed in visit on 01/10/22  Microscopic Examination   Urine  Result Value Ref Range   WBC, UA None seen 0 - 5 /hpf   RBC, Urine >30 (H) 0 - 2 /hpf   Epithelial Cells (non renal) 0-10 0 - 10 /hpf   Bacteria, UA None seen None seen/Few  Urine Culture   Specimen: Urine   UR  Result Value Ref Range   Urine Culture, Routine Final report    Organism ID, Bacteria No growth   TSH  Result Value Ref Range   TSH 1.660 0.450 - 4.500 uIU/mL  PSA  Result Value Ref Range   Prostate Specific Ag, Serum 1.7 0.0 - 4.0 ng/mL  Lipid panel  Result Value Ref Range   Cholesterol, Total 175 100 - 199 mg/dL   Triglycerides 753 0 - 149 mg/dL   HDL 38 (L) >04 mg/dL   VLDL Cholesterol Cal 22 5 - 40 mg/dL   LDL Chol Calc (NIH) 596 (H) 0 - 99 mg/dL   Chol/HDL Ratio 4.6 0.0 - 5.0 ratio  CBC with Differential/Platelet  Result Value Ref Range   WBC 4.1 3.4 - 10.8 x10E3/uL   RBC 4.79 4.14 - 5.80 x10E6/uL   Hemoglobin 14.8 13.0 - 17.7 g/dL   Hematocrit 62.3 25.4 - 51.0 %   MCV 89 79 - 97 fL   MCH 30.9 26.6 - 33.0 pg   MCHC 34.7 31.5 - 35.7 g/dL   RDW 17.3 61.3 - 80.6 %   Platelets 176 150 - 450 x10E3/uL   Neutrophils 51 Not Estab. %   Lymphs 34 Not Estab. %   Monocytes 8 Not Estab. %   Eos 5 Not Estab. %   Basos 2 Not Estab. %   Neutrophils Absolute 2.1 1.4 - 7.0 x10E3/uL   Lymphocytes Absolute 1.4 0.7 - 3.1 x10E3/uL   Monocytes Absolute 0.3 0.1 - 0.9 x10E3/uL  EOS  (ABSOLUTE) 0.2 0.0 - 0.4 x10E3/uL   Basophils Absolute 0.1 0.0 - 0.2 x10E3/uL   Immature Granulocytes 0 Not Estab. %   Immature Grans (Abs) 0.0 0.0 - 0.1 x10E3/uL  Comprehensive metabolic panel  Result Value Ref Range   Glucose 101 (H) 70 - 99 mg/dL   BUN 13 8 - 27 mg/dL   Creatinine, Ser 0.97 0.76 - 1.27 mg/dL   eGFR 86 >59 mL/min/1.73   BUN/Creatinine Ratio 13 10 - 24   Sodium 141 134 - 144 mmol/L   Potassium 4.5 3.5 - 5.2 mmol/L   Chloride 104 96 - 106 mmol/L   CO2 22 20 - 29 mmol/L   Calcium 9.7 8.6 - 10.2 mg/dL   Total Protein 6.6 6.0 - 8.5 g/dL   Albumin 4.7 3.9 - 4.9 g/dL   Globulin, Total 1.9 1.5 - 4.5 g/dL   Albumin/Globulin Ratio 2.5 (H) 1.2 - 2.2   Bilirubin Total 0.3 0.0 - 1.2 mg/dL   Alkaline Phosphatase 81 44 - 121 IU/L   AST 23 0 - 40 IU/L   ALT 31 0 - 44 IU/L  Urinalysis, Routine w reflex microscopic  Result Value Ref Range   Specific Gravity, UA >1.030 (H) 1.005 - 1.030   pH, UA 6.0 5.0 - 7.5   Color, UA Amber (A) Yellow   Appearance Ur Cloudy (A) Clear   Leukocytes,UA Negative Negative   Protein,UA Negative Negative/Trace   Glucose, UA Negative Negative   Ketones, UA Negative Negative   RBC, UA 3+ (A) Negative   Bilirubin, UA Negative Negative   Urobilinogen, Ur 1.0 0.2 - 1.0 mg/dL   Nitrite, UA Negative Negative   Microscopic Examination See below:   Alpha-Gal Panel  Result Value Ref Range   Class Description Allergens Comment    IgE (Immunoglobulin E), Serum 52 6 - 495 IU/mL   [id number] Alpha-Gal 1.49 (A) Class III kU/L   Beef IgE 1.18 (A) Class II kU/L   Pork IgE 0.61 (A) Class II kU/L   Allergen Lamb IgE 0.24 (A) Class 0/I kU/L      Assessment & Plan:   Problem List Items Addressed This Visit   None    Follow up plan: No follow-ups on file.

## 2022-03-09 ENCOUNTER — Encounter: Payer: Self-pay | Admitting: Nurse Practitioner

## 2022-03-09 ENCOUNTER — Ambulatory Visit (INDEPENDENT_AMBULATORY_CARE_PROVIDER_SITE_OTHER): Payer: Medicare HMO | Admitting: Nurse Practitioner

## 2022-03-09 VITALS — BP 120/80 | HR 91 | Temp 98.7°F | Wt 255.2 lb

## 2022-03-09 DIAGNOSIS — J0110 Acute frontal sinusitis, unspecified: Secondary | ICD-10-CM | POA: Diagnosis not present

## 2022-03-09 MED ORDER — DOXYCYCLINE HYCLATE 100 MG PO TABS: 100.0000 mg | ORAL_TABLET | Freq: Two times a day (BID) | ORAL | 0 refills | Status: DC

## 2022-03-24 ENCOUNTER — Ambulatory Visit: Payer: Medicare HMO

## 2022-04-05 ENCOUNTER — Ambulatory Visit (INDEPENDENT_AMBULATORY_CARE_PROVIDER_SITE_OTHER): Payer: Medicare HMO | Admitting: *Deleted

## 2022-04-05 DIAGNOSIS — Z0000 Encounter for general adult medical examination without abnormal findings: Secondary | ICD-10-CM

## 2022-04-05 NOTE — Patient Instructions (Signed)
Thomas Mercado , Thank you for taking time to come for your Medicare Wellness Visit. I appreciate your ongoing commitment to your health goals. Please review the following plan we discussed and let me know if I can assist you in the future.   These are the goals we discussed:  Goals   None     This is a list of the screening recommended for you and due dates:  Health Maintenance  Topic Date Due   COVID-19 Vaccine (5 - Pfizer risk series) 06/09/2021   Flu Shot  01/18/2022   Tetanus Vaccine  07/05/2023   Colon Cancer Screening  10/22/2025   Pneumonia Vaccine  Completed   Hepatitis C Screening: USPSTF Recommendation to screen - Ages 18-79 yo.  Completed   Zoster (Shingles) Vaccine  Completed   HPV Vaccine  Aged Out    Advanced directives: Education provided  Conditions/risks identified:   Next appointment: Follow up in one year for your annual wellness visit. 07-13-2022 @ 8:40  [place]  Preventive Care 65 Years and Older, Male  Preventive care refers to lifestyle choices and visits with your health care provider that can promote health and wellness. What does preventive care include? A yearly physical exam. This is also called an annual well check. Dental exams once or twice a year. Routine eye exams. Ask your health care provider how often you should have your eyes checked. Personal lifestyle choices, including: Daily care of your teeth and gums. Regular physical activity. Eating a healthy diet. Avoiding tobacco and drug use. Limiting alcohol use. Practicing safe sex. Taking low doses of aspirin every day. Taking vitamin and mineral supplements as recommended by your health care provider. What happens during an annual well check? The services and screenings done by your health care provider during your annual well check will depend on your age, overall health, lifestyle risk factors, and family history of disease. Counseling  Your health care provider may ask you questions  about your: Alcohol use. Tobacco use. Drug use. Emotional well-being. Home and relationship well-being. Sexual activity. Eating habits. History of falls. Memory and ability to understand (cognition). Work and work Statistician. Screening  You may have the following tests or measurements: Height, weight, and BMI. Blood pressure. Lipid and cholesterol levels. These may be checked every 5 years, or more frequently if you are over 22 years old. Skin check. Lung cancer screening. You may have this screening every year starting at age 3 if you have a 30-pack-year history of smoking and currently smoke or have quit within the past 15 years. Fecal occult blood test (FOBT) of the stool. You may have this test every year starting at age 48. Flexible sigmoidoscopy or colonoscopy. You may have a sigmoidoscopy every 5 years or a colonoscopy every 10 years starting at age 25. Prostate cancer screening. Recommendations will vary depending on your family history and other risks. Hepatitis C blood test. Hepatitis B blood test. Sexually transmitted disease (STD) testing. Diabetes screening. This is done by checking your blood sugar (glucose) after you have not eaten for a while (fasting). You may have this done every 1-3 years. Abdominal aortic aneurysm (AAA) screening. You may need this if you are a current or former smoker. Osteoporosis. You may be screened starting at age 67 if you are at high risk. Talk with your health care provider about your test results, treatment options, and if necessary, the need for more tests. Vaccines  Your health care provider may recommend certain vaccines, such as:  Influenza vaccine. This is recommended every year. Tetanus, diphtheria, and acellular pertussis (Tdap, Td) vaccine. You may need a Td booster every 10 years. Zoster vaccine. You may need this after age 90. Pneumococcal 13-valent conjugate (PCV13) vaccine. One dose is recommended after age 81. Pneumococcal  polysaccharide (PPSV23) vaccine. One dose is recommended after age 33. Talk to your health care provider about which screenings and vaccines you need and how often you need them. This information is not intended to replace advice given to you by your health care provider. Make sure you discuss any questions you have with your health care provider. Document Released: 07/03/2015 Document Revised: 02/24/2016 Document Reviewed: 04/07/2015 Elsevier Interactive Patient Education  2017 [place] Prevention in the Home Falls can cause injuries. They can happen to people of all ages. There are many things you can do to make your home safe and to help prevent falls. What can I do on the outside of my home? Regularly fix the edges of walkways and driveways and fix any cracks. Remove anything that might make you trip as you walk through a door, such as a raised step or threshold. Trim any bushes or trees on the path to your home. Use bright outdoor lighting. Clear any walking paths of anything that might make someone trip, such as rocks or tools. Regularly check to see if handrails are loose or broken. Make sure that both sides of any steps have handrails. Any raised decks and porches should have guardrails on the edges. Have any leaves, snow, or ice cleared regularly. Use sand or salt on walking paths during winter. Clean up any spills in your garage right away. This includes oil or grease spills. What can I do in the bathroom? Use night lights. Install grab bars by the toilet and in the tub and shower. Do not use towel bars as grab bars. Use non-skid mats or decals in the tub or shower. If you need to sit down in the shower, use a plastic, non-slip stool. Keep the floor dry. Clean up any water that spills on the floor as soon as it happens. Remove soap buildup in the tub or shower regularly. Attach bath mats securely with double-sided non-slip rug tape. Do not have throw rugs and other  things on the floor that can make you trip. What can I do in the bedroom? Use night lights. Make sure that you have a light by your bed that is easy to reach. Do not use any sheets or blankets that are too big for your bed. They should not hang down onto the floor. Have a firm chair that has side arms. You can use this for support while you get dressed. Do not have throw rugs and other things on the floor that can make you trip. What can I do in the kitchen? Clean up any spills right away. Avoid walking on wet floors. Keep items that you use a lot in easy-to-reach places. If you need to reach something above you, use a strong step stool that has a grab bar. Keep electrical cords out of the way. Do not use floor polish or wax that makes floors slippery. If you must use wax, use non-skid floor wax. Do not have throw rugs and other things on the floor that can make you trip. What can I do with my stairs? Do not leave any items on the stairs. Make sure that there are handrails on both sides of the stairs and use them.  Fix handrails that are broken or loose. Make sure that handrails are as long as the stairways. Check any carpeting to make sure that it is firmly attached to the stairs. Fix any carpet that is loose or worn. Avoid having throw rugs at the top or bottom of the stairs. If you do have throw rugs, attach them to the floor with carpet tape. Make sure that you have a light switch at the top of the stairs and the bottom of the stairs. If you do not have them, ask someone to add them for you. What else can I do to help prevent falls? Wear shoes that: Do not have high heels. Have rubber bottoms. Are comfortable and fit you well. Are closed at the toe. Do not wear sandals. If you use a stepladder: Make sure that it is fully opened. Do not climb a closed stepladder. Make sure that both sides of the stepladder are locked into place. Ask someone to hold it for you, if possible. Clearly  mark and make sure that you can see: Any grab bars or handrails. First and last steps. Where the edge of each step is. Use tools that help you move around (mobility aids) if they are needed. These include: Canes. Walkers. Scooters. Crutches. Turn on the lights when you go into a dark area. Replace any light bulbs as soon as they burn out. Set up your furniture so you have a clear path. Avoid moving your furniture around. If any of your floors are uneven, fix them. If there are any pets around you, be aware of where they are. Review your medicines with your doctor. Some medicines can make you feel dizzy. This can increase your chance of falling. Ask your doctor what other things that you can do to help prevent falls. This information is not intended to replace advice given to you by your health care provider. Make sure you discuss any questions you have with your health care provider. Document Released: 04/02/2009 Document Revised: 11/12/2015 Document Reviewed: 07/11/2014 Elsevier Interactive Patient Education  2017 [employer].

## 2022-04-05 NOTE — Progress Notes (Signed)
Subjective:   Thomas Mercado is a 66 y.o. male who presents for Medicare Annual/Subsequent preventive examination.  I connected with  Thomas Mercado on 04/05/22 by a telephone enabled telemedicine application and verified that I am speaking with the correct person using two identifiers.   I discussed the limitations of evaluation and management by telemedicine. The patient expressed understanding and agreed to proceed.  Patient location: home  Provider location: Tele-health-home     Review of Systems     Cardiac Risk Factors include: advanced age (>37mn, >>76women);family history of premature cardiovascular disease;male gender;obesity (BMI >30kg/m2);hypertension     Objective:    Today's Vitals   There is no height or weight on file to calculate BMI.     04/05/2022   11:33 AM 05/20/2021    9:51 AM 05/17/2021   10:34 AM 03/18/2021    4:33 PM 08/28/2017   10:41 AM 08/21/2017   12:59 PM 07/29/2017    1:58 PM  Advanced Directives  Does Patient Have a Medical Advance Directive? No No No No No No No  Does patient want to make changes to medical advance directive?  No - Patient declined No - Guardian declined      Would patient like information on creating a medical advance directive? No - Patient declined No - Patient declined No - Patient declined No - Patient declined No - Patient declined No - Patient declined No - Patient declined    Current Medications (verified) Outpatient Encounter Medications as of 04/05/2022  Medication Sig   acetaminophen (TYLENOL) 500 MG tablet Take 1,000 mg by mouth every 6 (six) hours as needed.   aspirin EC 81 MG tablet Take 81 mg by mouth daily.   benazepril (LOTENSIN) 5 MG tablet Take 1 tablet (5 mg total) by mouth daily.   cetirizine (ZYRTEC) 10 MG tablet TAKE ONE TABLET BY MOUTH DAILY   doxycycline (VIBRA-TABS) 100 MG tablet Take 1 tablet (100 mg total) by mouth 2 (two) times daily.   fluorouracil (EFUDEX) 5 % cream Apply 1 application topically 2  (two) times daily.   fluticasone (FLONASE) 50 MCG/ACT nasal spray Place 2 sprays into both nostrils daily. (Patient taking differently: Place 1 spray into both nostrils daily as needed for allergies.)   pantoprazole (PROTONIX) 40 MG tablet TAKE ONE TABLET BY MOUTH EVERY MORNING AND TAKE ONE TABLET BY MOUTH EVERY NIGHT AT BEDTIME   rosuvastatin (CRESTOR) 10 MG tablet Take 1 tablet (10 mg total) by mouth daily.   No facility-administered encounter medications on file as of 04/05/2022.    Allergies (verified) Alpha-gal and Penicillin g benzathine   History: Past Medical History:  Diagnosis Date   Allergy    [place] ([place] 07/31/[place]   Chest x-ray February 2019   [facility]   [facility]    CPAP (continuous positive airway pressure) dependence    Dysrhythmia    ED (erectile dysfunction)    GERD (gastroesophageal reflux disease)    Heart murmur    Hyperlipidemia    Hypertension    Sleep apnea    Sleep apnea    Past Surgical History:  Procedure Laterality Date   ABLATION  Sept 2014   heart   ABLATION  12/2015   heart   COLONOSCOPY WITH PROPOFOL N/A 10/23/2015   Procedure: COLONOSCOPY WITH PROPOFOL;  Surgeon: MLollie Sails MD;  [facility];  Service: Endoscopy;  Laterality: N/A;   KNEE ARTHROSCOPY  Left 04/09/2015   Procedure: ,left knee arthroscopy, limited synovectomy and partial medial menisectomy.;  Surgeon: Thornton Park, MD;  Location: [facility] ORS;  Service: Orthopedics;  Laterality: Left;   SHOULDER ARTHROSCOPY WITH OPEN ROTATOR CUFF REPAIR AND DISTAL CLAVICLE ACROMINECTOMY Left 05/20/2021   Procedure: SHOULDER ARTHROSCOPY WITH OPEN ROTATOR CUFF REPAIR AND DISTAL CLAVICLE ACROMINECTOMY;  Surgeon: Thornton Park, MD;  Location: [facility] ORS;  Service: Orthopedics;  Laterality: Left;   VARICOCELE EXCISION     Family History  Problem Relation Age of Onset   Breast cancer Mother     Alzheimer's disease Father    Hypertension Father    Arthritis Father    Heart disease Brother    Emphysema Brother    Social History   Socioeconomic History   Marital status: Married    Spouse name: Not on file   Number of children: Not on file   Years of education: Not on file   Highest education level: Not on file  Occupational History   Occupation: full time    Employer: [employer]  Tobacco Use   Smoking status: Never   Smokeless tobacco: Never  Vaping Use   Vaping Use: Never used  Substance and Sexual Activity   Alcohol use: Yes    Alcohol/week: 0.0 standard drinks of alcohol    Comment: on occasion with beer or liquor   Drug use: No   Sexual activity: Yes  Other Topics Concern   Not on file  Social History Narrative   Not on file   Social Determinants of Health   Financial Resource Strain: Low Risk  (04/05/2022)   Overall Financial Resource Strain (CARDIA)    Difficulty of Paying Living Expenses: Not hard at all  Food Insecurity: No Food Insecurity (04/05/2022)   Hunger Vital Sign    Worried About Running Out of Food in the Last Year: Never true    Ran Out of Food in the Last Year: Never true  Transportation Needs: No Transportation Needs (04/05/2022)   PRAPARE - Hydrologist (Medical): No    Lack of Transportation (Non-Medical): No  Physical Activity: Inactive (04/05/2022)   Exercise Vital Sign    Days of Exercise per Week: 0 days    Minutes of Exercise per Session: 0 min  Stress: No Stress Concern Present (04/05/2022)   [place]    Feeling of Stress : Not at all  Social Connections: Moderately Integrated (04/05/2022)   Social Connection and Isolation Panel [NHANES]    Frequency of Communication with Friends and Family: More than three times a week    Frequency of Social Gatherings with Friends and Family: Twice a week    Attends Religious Services:  Never    Marine scientist or Organizations: Yes    Attends Music therapist: More than 4 times per year    Marital Status: Married    Tobacco Counseling Counseling given: Not Answered   Clinical Intake:  Pre-visit preparation completed: Yes  Pain : No/denies pain     Diabetes: No  How often do you need to have someone help you when you read instructions, pamphlets, or other written materials from your doctor or pharmacy?: 1 - Never  Diabetic?  no  Interpreter Needed?: No  Information entered by :: Leroy Kennedy LPN   Activities of Daily Living    04/05/2022   11:35 AM 05/17/2021   10:39 AM  In your present  state of health, do you have any difficulty performing the following activities:  Hearing? 0   Vision? 0   Difficulty concentrating or making decisions? 0   Walking or climbing stairs? 0   Dressing or bathing? 0   Doing errands, shopping? 0 0  Preparing Food and eating ? N   Using the Toilet? N   In the past six months, have you accidently leaked urine? N   Do you have problems with loss of bowel control? N   Managing your Medications? N   Housekeeping or managing your Housekeeping? N     Patient Care Team: Jon Billings, NP as PCP - General Telford Nab, RN as Registered Nurse  Indicate any recent Medical Services you may have received from other than [facility] providers in the past year (date may be approximate).     Assessment:   This is a routine wellness examination for [place].  Hearing/Vision screen Hearing Screening - Comments:: No trouble hearing Vision Screening - Comments:: [place] Up to date  Dietary issues and exercise activities discussed: Current Exercise Habits: The patient does not participate in regular exercise at present   Goals Addressed   None    Depression Screen    04/05/2022   11:44 AM 04/05/2022   11:37 AM 03/09/2022    9:55 AM 01/10/2022    8:38 AM 07/12/2021   10:05 AM 03/18/2021    4:30 PM  02/09/2021    8:30 AM  PHQ 2/9 Scores  PHQ - 2 Score 0 0 0 0 0 0 0  PHQ- 9 Score 0 0 '1 2 2      '$ Fall Risk    04/05/2022   11:33 AM 03/09/2022    9:55 AM 01/10/2022    8:38 AM 07/12/2021   10:04 AM 03/18/2021    4:33 PM  Fall Risk   Falls in the past year? 0 0 0 0 0  Number falls in past yr: 0 0 0 0 0  Injury with Fall? 0 0 0 0 0  Risk for fall due to :  No Fall Risks No Fall Risks No Fall Risks No Fall Risks  Follow up Falls evaluation completed;Education provided;Falls prevention discussed Falls evaluation completed Falls evaluation completed Falls evaluation completed Falls evaluation completed    FALL RISK PREVENTION PERTAINING TO THE HOME:  Any stairs in or around the home? Yes  If so, are there any without handrails? No  Home free of loose throw rugs in walkways, pet beds, electrical cords, etc? Yes  Adequate lighting in your home to reduce risk of falls? Yes   ASSISTIVE DEVICES UTILIZED TO PREVENT FALLS:  Life alert? No  Use of a cane, walker or w/c? No  Grab bars in the bathroom? No  Shower chair or bench in shower? Yes  Elevated toilet seat or a handicapped toilet? Yes   TIMED UP AND GO:  Was the test performed? No .    Cognitive Function:        04/05/2022   11:34 AM 03/18/2021    4:34 PM  6CIT Screen  What Year? 0 points 0 points  What month? 0 points 0 points  What time? 0 points 0 points  Count back from 20 0 points 0 points  Months in reverse 0 points 0 points  Repeat phrase 0 points 0 points  Total Score 0 points 0 points    Immunizations Immunization History  Administered Date(s) Administered   Influenza,inj,Quad PF,6+ Mos 03/11/2015, 04/18/2018, 04/16/2019, 03/11/2020  Influenza-Unspecified 03/28/2017, 03/11/2020, 04/14/2021   PFIZER(Purple Top)SARS-COV-2 Vaccination 09/06/2019, 10/02/2019, 05/27/2020, 04/14/2021   Pneumococcal Conjugate-13 12/25/2019   Pneumococcal Polysaccharide-23 04/16/2021   Tdap 07/04/2013   Zoster Recombinat  (Shingrix) 04/16/2019, 08/06/2019   Zoster, Live 01/26/2016    TDAP status: Up to date  Flu Vaccine status: Due, Education has been provided regarding the importance of this vaccine. Advised may receive this vaccine at local pharmacy or Health Dept. Aware to provide a copy of the vaccination record if obtained from local pharmacy or Health Dept. Verbalized acceptance and understanding.  Pneumococcal vaccine status: Up to date  Covid-19 vaccine status: Information provided on how to obtain vaccines.   Qualifies for Shingles Vaccine? No   Zostavax completed Yes   Shingrix Completed?: Yes  Screening Tests Health Maintenance  Topic Date Due   COVID-19 Vaccine (5 - Pfizer risk series) 06/09/2021   INFLUENZA VACCINE  01/18/2022   TETANUS/TDAP  07/05/2023   COLONOSCOPY (Pts 45-57yr Insurance coverage will need to be confirmed)  10/22/2025   Pneumonia Vaccine 66 Years old  Completed   Hepatitis C Screening  Completed   Zoster Vaccines- Shingrix  Completed   HPV VACCINES  Aged Out    Health Maintenance  Health Maintenance Due  Topic Date Due   COVID-19 Vaccine (5 - Pfizer risk series) 06/09/2021   INFLUENZA VACCINE  01/18/2022    Colorectal cancer screening: Type of screening: Colonoscopy. Completed 2022. Repeat every 5 years  Lung Cancer Screening: (Low Dose CT Chest recommended if Age 371-80years, 30 pack-year currently smoking OR have quit w/in 15years.) does not qualify.   Lung Cancer Screening Referral:   Additional Screening:  Hepatitis C Screening: does not qualify; Completed 2017  Vision Screening: Recommended annual ophthalmology exams for early detection of glaucoma and other disorders of the eye. Is the patient up to date with their annual eye exam?  Yes  Who is the provider or what is the name of the office in which the patient attends annual eye exams? cheek If pt is not established with a provider, would they like to be referred to a provider to establish  care? No .   Dental Screening: Recommended annual dental exams for proper oral hygiene  Community Resource Referral / Chronic Care Management: CRR required this visit?  No   CCM required this visit?  No      Plan:     I have personally reviewed and noted the following in the patient's chart:   Medical and social history Use of alcohol, tobacco or illicit drugs  Current medications and supplements including opioid prescriptions. Patient is not currently taking opioid prescriptions. Functional ability and status Nutritional status Physical activity Advanced directives List of other physicians Hospitalizations, surgeries, and ER visits in previous 12 months Vitals Screenings to include cognitive, depression, and falls Referrals and appointments  In addition, I have reviewed and discussed with patient certain preventive protocols, quality metrics, and best practice recommendations. A written personalized care plan for preventive services as well as general preventive health recommendations were provided to patient.     JLeroy Kennedy LPN   [id number]  Nurse Notes:

## 2022-04-14 DIAGNOSIS — D485 Neoplasm of uncertain behavior of skin: Secondary | ICD-10-CM | POA: Diagnosis not present

## 2022-04-14 DIAGNOSIS — D044 Carcinoma in situ of skin of scalp and neck: Secondary | ICD-10-CM | POA: Diagnosis not present

## 2022-04-20 ENCOUNTER — Ambulatory Visit (INDEPENDENT_AMBULATORY_CARE_PROVIDER_SITE_OTHER): Payer: Medicare HMO | Admitting: Internal Medicine

## 2022-04-20 VITALS — BP 108/80 | HR 76 | Resp 16 | Ht 74.5 in | Wt 256.6 lb

## 2022-04-20 DIAGNOSIS — E669 Obesity, unspecified: Secondary | ICD-10-CM | POA: Diagnosis not present

## 2022-04-20 DIAGNOSIS — G4733 Obstructive sleep apnea (adult) (pediatric): Secondary | ICD-10-CM

## 2022-04-20 DIAGNOSIS — Z7189 Other specified counseling: Secondary | ICD-10-CM | POA: Diagnosis not present

## 2022-04-20 DIAGNOSIS — I10 Essential (primary) hypertension: Secondary | ICD-10-CM

## 2022-04-20 NOTE — Progress Notes (Signed)
[facility] [place], [place] [address]  Pulmonary Sleep Medicine   Office Visit Note  Patient Name: Thomas Mercado DOB: 10-13-1955 MRN [id number]    Chief Complaint: Obstructive Sleep Apnea visit  Brief History:  Thomas Mercado is seen today for an annual follow up on CPAP'@7cmh20'$ .  The patient has a 13 year history of sleep apnea. Patient is using PAP nightly.  The patient feels rested after sleeping with PAP.  The patient reports benefit from PAP use. Reported sleepiness is  improved and the Epworth Sleepiness Score is 6 out of 24. The patient does not take naps. The patient complains of the following: no complaints with therapy  The compliance download shows 95% compliance with an average use time of 7 hours 14 min. The AHI is 1.9  The patient does not complain of limb movements disrupting sleep.  ROS  General: (-) fever, (-) chills, (-) night sweat Nose and Sinuses: (-) nasal stuffiness or itchiness, (-) postnasal drip, (-) nosebleeds, (-) sinus trouble. Mouth and Throat: (-) sore throat, (-) hoarseness. Neck: (-) swollen glands, (-) enlarged thyroid, (-) neck pain. Respiratory: - cough, - shortness of breath, - wheezing. Neurologic: - numbness, - tingling. Psychiatric: - anxiety, - depression   Current Medication: Outpatient Encounter Medications as of 04/20/2022  Medication Sig   acetaminophen (TYLENOL) 500 MG tablet Take 1,000 mg by mouth every 6 (six) hours as needed.   aspirin EC 81 MG tablet Take 81 mg by mouth daily.   benazepril (LOTENSIN) 5 MG tablet Take 1 tablet (5 mg total) by mouth daily.   cetirizine (ZYRTEC) 10 MG tablet TAKE ONE TABLET BY MOUTH DAILY   doxycycline (VIBRA-TABS) 100 MG tablet Take 1 tablet (100 mg total) by mouth 2 (two) times daily.   fluorouracil (EFUDEX) 5 % cream Apply 1 application topically 2 (two) times daily.   fluticasone (FLONASE) 50 MCG/ACT nasal spray Place 2 sprays into both nostrils daily. (Patient taking  differently: Place 1 spray into both nostrils daily as needed for allergies.)   pantoprazole (PROTONIX) 40 MG tablet TAKE ONE TABLET BY MOUTH EVERY MORNING AND TAKE ONE TABLET BY MOUTH EVERY NIGHT AT BEDTIME   rosuvastatin (CRESTOR) 10 MG tablet Take 1 tablet (10 mg total) by mouth daily.   No facility-administered encounter medications on file as of 04/20/2022.    Surgical History: Past Surgical History:  Procedure Laterality Date   ABLATION  Sept 2014   heart   ABLATION  12/2015   heart   COLONOSCOPY WITH PROPOFOL N/A 10/23/2015   Procedure: COLONOSCOPY WITH PROPOFOL;  Surgeon: Lollie Sails, MD;  Location: [facility] END[facility]COPY;  Service: Endoscopy;  Laterality: N/A;   KNEE ARTHROSCOPY Left 04/09/2015   Procedure: ,left knee arthroscopy, limited synovectomy and partial medial menisectomy.;  Surgeon: Thornton Park, MD;  Location: [facility] ORS;  Service: Orthopedics;  Laterality: Left;   SHOULDER ARTHROSCOPY WITH OPEN ROTATOR CUFF REPAIR AND DISTAL CLAVICLE ACROMINECTOMY Left 05/20/2021   Procedure: SHOULDER ARTHROSCOPY WITH OPEN ROTATOR CUFF REPAIR AND DISTAL CLAVICLE ACROMINECTOMY;  Surgeon: Thornton Park, MD;  Location: [facility] ORS;  Service: Orthopedics;  Laterality: Left;   VARICOCELE EXCISION      Medical History: Past Medical History:  Diagnosis Date   Allergy    Aortic atherosclerosis ([place]) 07/31/2017   Chest x-ray February 2019   Atrial fibrillation [facility]   Atrial fibrillation ([place])    CPAP (continuous positive airway pressure) dependence    Dysrhythmia  ED (erectile dysfunction)    GERD (gastroesophageal reflux disease)    Heart murmur    Hyperlipidemia    Hypertension    Sleep apnea    Sleep apnea     Family History: Non contributory to the present illness  Social History: Social History   Socioeconomic History   Marital status: Married    Spouse name: Not on file   Number of children: Not on file   Years of education:  Not on file   Highest education level: Not on file  Occupational History   Occupation: full time    Employer: [employer]  Tobacco Use   Smoking status: Never   Smokeless tobacco: Never  Vaping Use   Vaping Use: Never used  Substance and Sexual Activity   Alcohol use: Yes    Alcohol/week: 0.0 standard drinks of alcohol    Comment: on occasion with beer or liquor   Drug use: No   Sexual activity: Yes  Other Topics Concern   Not on file  Social History Narrative   Not on file   Social Determinants of Health   Financial Resource Strain: Low Risk  (04/05/2022)   Overall Financial Resource Strain (CARDIA)    Difficulty of Paying Living Expenses: Not hard at all  Food Insecurity: No Food Insecurity (04/05/2022)   Hunger Vital Sign    Worried About Running Out of Food in the Last Year: Never true    Ran Out of Food in the Last Year: Never true  Transportation Needs: No Transportation Needs (04/05/2022)   PRAPARE - Hydrologist (Medical): No    Lack of Transportation (Non-Medical): No  Physical Activity: Inactive (04/05/2022)   Exercise Vital Sign    Days of Exercise per Week: 0 days    Minutes of Exercise per Session: 0 min  Stress: No Stress Concern Present (04/05/2022)   [place]    Feeling of Stress : Not at all  Social Connections: Moderately Integrated (04/05/2022)   Social Connection and Isolation Panel [NHANES]    Frequency of Communication with Friends and Family: More than three times a week    Frequency of Social Gatherings with Friends and Family: Twice a week    Attends Religious Services: Never    Marine scientist or Organizations: Yes    Attends Music therapist: More than 4 times per year    Marital Status: Married  Human resources officer Violence: Not At Risk (04/05/2022)   Humiliation, Afraid, Rape, and Kick questionnaire    Fear of Current or  Ex-Partner: No    Emotionally Abused: No    Physically Abused: No    Sexually Abused: No    Vital Signs: Blood pressure 108/80, pulse 76, resp. rate 16, height 6' 2.5" (1.892 m), weight 256 lb 9.6 oz (116.4 kg), SpO2 97 %. Body mass index is 32.5 kg/m.    Examination: General Appearance: The patient is well-developed, well-nourished, and in no distress. Neck Circumference: 41cm Skin: Gross inspection of skin unremarkable. Head: normocephalic, no gross deformities. Eyes: no gross deformities noted. ENT: ears appear grossly normal Neurologic: Alert and oriented. No involuntary movements.  STOP BANG RISK ASSESSMENT S (snore) Have you been told that you snore?     NO   T (tired) Are you often tired, fatigued, or sleepy during the day?   NO  O (obstruction) Do you stop breathing, choke, or gasp during sleep? NO  P (pressure) Do you have or are you being treated for high blood pressure? YES   B (BMI) Is your body index greater than 35 kg/m? NO   A (age) Are you 49 years old or older? YES   N (neck) Do you have a neck circumference greater than 16 inches?   YES   G (gender) Are you a male? YES   TOTAL STOP/BANG "YES" ANSWERS 4       A STOP-Bang score of 2 or less is considered low risk, and a score of 5 or more is high risk for having either moderate or severe OSA. For people who score 3 or 4, doctors may need to perform further assessment to determine how likely they are to have OSA.         EPWORTH SLEEPINESS SCALE:  Scale:  (0)= no chance of dozing; (1)= slight chance of dozing; (2)= moderate chance of dozing; (3)= high chance of dozing  Chance  Situtation    Sitting and reading: 1    Watching TV: 2    Sitting Inactive in public: 0    As a passenger in car: 1      Lying down to rest: 1    Sitting and talking: 0    Sitting quielty after lunch: 1    In a car, stopped in traffic: 0   TOTAL SCORE:   6 out of 24    SLEEP STUDIES:  PSG (12/2008) AHI  10/hr, Supine AHI 22/hr, RERA 27/hr, Supine RERA 56/hr, min SpO2 85% Titration (01/2009) CPAP@ 7 cmH2O   CPAP COMPLIANCE DATA:  Date Range: 04/15/21-04/14/22  Average Daily Use: 7 hours  Median Use: 7 hrs 26 min  Compliance for > 4 Hours: 95% days  AHI: 1.9 respiratory events per hour  Days Used: 361/365  Mask Leak: 9.9  95th Percentile Pressure: 7         LABS: No results found for this or any previous visit (from the past 2160 hour(s)).  Radiology: [place] OR NERVE BLOCK-IMAGE ONLY [facility])  Result Date: 05/20/2021 There is no interpretation for this exam.  This order is for images obtained during a surgical procedure.  Please See "Surgeries" Tab for more information regarding the procedure.    No results found.  No results found.    Assessment and Plan: Patient Active Problem List   Diagnosis Date Noted   Allergic reaction to alpha-gal 02/15/2021   OSA on CPAP 01/05/2021   CPAP use counseling 01/05/2021   Obesity (BMI 30-39.9) 01/05/2021   Paroxysmal atrial fibrillation ([place]) 01/05/2021   Trigger finger of right hand 11/28/2017   Aortic atherosclerosis ([place]) 07/31/2017   Solitary pulmonary nodule 07/31/2017   Sprain of knee 06/05/2017   Essential hypertension 03/28/2017   Infection of parotid gland 01/26/2016   Knee pain, left 04/06/2015   ED (erectile dysfunction) 03/10/2015   Allergic rhinitis 03/10/2015   Hypogonadism in male 60/03/9322   Non-alcoholic fatty liver disease 03/10/2015   Obstructive apnea 03/12/2013   Acid reflux 02/07/2013   Hyperlipidemia 02/07/2013      The patient does tolerate PAP and reports benefit from PAP use. The patient was reminded how to adjust mask fit and advised to change supplies regularly. The patient was also counselled on nightly use. The compliance is excellent. The AHI is 1.9. Pt continues to require cpap to treat his apnea and is medically necessary.   1. OSA on CPAP Continue excellent compliance  2. CPAP  use counseling CPAP couseling-Discussed  importance of adequate CPAP use as well as proper care and cleaning techniques of machine and all supplies.  3. Essential hypertension Continue current medication and f/u with PCP.  4. Obesity (BMI 30-39.9) Obesity Counseling: Had a lengthy discussion regarding patients BMI and weight issues. Patient was instructed on portion control as well as increased activity. Also discussed caloric restrictions with trying to maintain intake less than 2000 Kcal. Discussions were made in accordance with the 5As of weight management. Simple actions such as not eating late and if able to, taking a walk is suggested.    General Counseling: I have discussed the findings of the evaluation and examination with Keilyn.  I have also discussed any further diagnostic evaluation thatmay be needed or ordered today. Ezrael verbalizes understanding of the findings of todays visit. We also reviewed his medications today and discussed drug interactions and side effects including but not limited excessive drowsiness and altered mental states. We also discussed that there is always a risk not just to him but also people around him. he has been encouraged to call the office with any questions or concerns that should arise related to todays visit.  No orders of the defined types were placed in this encounter.       I have personally obtained a history, examined the patient, evaluated laboratory and imaging results, formulated the assessment and plan and placed orders.  This patient was seen by Drema Dallas, PA-C in collaboration with Dr. Devona Konig as a part of collaborative care agreement.  Allyne Gee, MD [facility] Diplomate ABMS Pulmonary Critical Care Medicine and Sleep Medicine

## 2022-04-20 NOTE — Patient Instructions (Signed)

## 2022-04-28 DIAGNOSIS — L578 Other skin changes due to chronic exposure to nonionizing radiation: Secondary | ICD-10-CM | POA: Diagnosis not present

## 2022-04-28 DIAGNOSIS — D225 Melanocytic nevi of trunk: Secondary | ICD-10-CM | POA: Diagnosis not present

## 2022-04-28 DIAGNOSIS — C44719 Basal cell carcinoma of skin of left lower limb, including hip: Secondary | ICD-10-CM | POA: Diagnosis not present

## 2022-04-28 DIAGNOSIS — L570 Actinic keratosis: Secondary | ICD-10-CM | POA: Diagnosis not present

## 2022-04-28 DIAGNOSIS — D485 Neoplasm of uncertain behavior of skin: Secondary | ICD-10-CM | POA: Diagnosis not present

## 2022-04-28 DIAGNOSIS — C4442 Squamous cell carcinoma of skin of scalp and neck: Secondary | ICD-10-CM | POA: Diagnosis not present

## 2022-04-28 DIAGNOSIS — Z85828 Personal history of other malignant neoplasm of skin: Secondary | ICD-10-CM | POA: Diagnosis not present

## 2022-04-28 DIAGNOSIS — Z859 Personal history of malignant neoplasm, unspecified: Secondary | ICD-10-CM | POA: Diagnosis not present

## 2022-05-24 DIAGNOSIS — C4442 Squamous cell carcinoma of skin of scalp and neck: Secondary | ICD-10-CM | POA: Diagnosis not present

## 2022-06-02 ENCOUNTER — Ambulatory Visit (INDEPENDENT_AMBULATORY_CARE_PROVIDER_SITE_OTHER): Payer: Medicare HMO

## 2022-06-02 DIAGNOSIS — Z23 Encounter for immunization: Secondary | ICD-10-CM | POA: Diagnosis not present

## 2022-06-02 NOTE — Progress Notes (Signed)
Patient presents today for Flu vaccination, patient received in left  deltoid, patient tolerated well.

## 2022-06-29 DIAGNOSIS — C44719 Basal cell carcinoma of skin of left lower limb, including hip: Secondary | ICD-10-CM | POA: Diagnosis not present

## 2022-06-29 DIAGNOSIS — L988 Other specified disorders of the skin and subcutaneous tissue: Secondary | ICD-10-CM | POA: Diagnosis not present

## 2022-06-29 HISTORY — PX: BASAL CELL CARCINOMA EXCISION: SHX1214

## 2022-06-30 ENCOUNTER — Other Ambulatory Visit: Payer: Self-pay | Admitting: Nurse Practitioner

## 2022-06-30 NOTE — Telephone Encounter (Signed)
Requested Prescriptions  Pending Prescriptions Disp Refills   pantoprazole (PROTONIX) 40 MG tablet [Pharmacy Med Name: PANTOPRAZOLE SOD DR 40 MG TAB] 180 tablet 0    Sig: TAKE ONE TABLET BY MOUTH EVERY MORNING AND TAKE ONE TABLET BY MOUTH EVERY NIGHT AT BEDTIME     Gastroenterology: Proton Pump Inhibitors Passed - 06/30/2022  6:23 AM      Passed - Valid encounter within last 12 months    Recent Outpatient Visits           3 months ago Acute non-recurrent frontal sinusitis   [place], NP   5 months ago Annual physical exam   [facility] Jon Billings, NP   11 months ago Aortic atherosclerosis [facility])   [facility] Jon Billings, NP   1 year ago Diarrhea, unspecified type   [facility] McElwee, Scheryl Darter, NP   1 year ago Annual physical exam   [facility] Jon Billings, NP       Future Appointments             In 1 week Jon Billings, NP [facility], [place]

## 2022-07-03 ENCOUNTER — Encounter: Payer: Self-pay | Admitting: Nurse Practitioner

## 2022-07-12 NOTE — Progress Notes (Unsigned)
There were no vitals taken for this visit.   Subjective:    Patient ID: Thomas Mercado, male    DOB: May 17, 1956, 67 y.o.   MRN: [id number]  HPI: Thomas Mercado is a 67 y.o. male  No chief complaint on file.  HYPERTENSION / HYPERLIPIDEMIA Satisfied with current treatment? yes Duration of hypertension: years BP monitoring frequency: not checking BP range:  BP medication side effects: no Past BP meds: benazepril Duration of hyperlipidemia: years Cholesterol medication side effects: no Cholesterol supplements: none Past cholesterol medications: rosuvastatin (crestor) Medication compliance: excellent compliance Aspirin: no Recent stressors: no Recurrent headaches: no Visual changes: no Palpitations: no Dyspnea: no Chest pain: no Lower extremity edema: no Dizzy/lightheaded: no  SLEEP APNEA Sleep apnea status: {Blank multiple:19196::"uncontrolled","controlled","better","worse","stable","fluctuating"} Duration: {Blank single:19197::"chronic","days","weeks","months"} Satisfied with current treatment?:  {Blank single:19197::"yes","no"} CPAP use:  {Blank single:19197::"yes","no"} Sleep quality with CPAP use: {Blank single:19197::"excellent","good", average","poor"} Treament compliance:{Blank single:19197::"excellent compliance","good compliance","fair compliance","poor compliance"} Last sleep study:  Treatments attempted:  Wakes feeling refreshed:  {Blank single:19197::"yes","no"} Daytime hypersomnolence:  {Blank single:19197::"yes","no"} Fatigue:  {Blank single:19197::"yes","no"} Insomnia:  {Blank single:19197::"yes","no"} Good sleep hygiene:  {Blank single:19197::"yes","no"} Difficulty falling asleep:  {Blank single:19197::"yes","no"} Difficulty staying asleep:  {Blank single:19197::"yes","no"} Snoring bothers bed partner:  {Blank single:19197::"yes","no"} Observed apnea by bed partner: {Blank single:19197::"yes","no"} Obesity:  {Blank single:19197::"yes","no"} Hypertension:  {Blank single:19197::"yes","no"}  Pulmonary hypertension:  {Blank single:19197::"yes","no"} Coronary artery disease:  {Blank single:19197::"yes","no"}   Patient states he had rotator cuff surgery December 1st.  He was taken out of the sling last week and seems like the pain has worsened since coming out it.   Patient would like to be rested for alpha gal.  Levels were right on the low side.  He would like to see if they have dropped lower.  Dr. Maisie Fus at [facility] is who he see's for alpha gal. Phone number: [phone].   Relevant past medical, surgical, family and social history reviewed and updated as indicated. Interim medical history since our last visit reviewed. Allergies and medications reviewed and updated.  Review of Systems  Eyes:  Negative for visual disturbance.  Respiratory:  Negative for chest tightness and shortness of breath.   Cardiovascular:  Negative for chest pain, palpitations and leg swelling.  Neurological:  Negative for dizziness, light-headedness and headaches.    Per HPI unless specifically indicated above     Objective:    There were no vitals taken for this visit.  Wt Readings from Last 3 Encounters:  04/20/22 256 lb 9.6 oz (116.4 kg)  03/09/22 255 lb 3.2 oz (115.8 kg)  01/10/22 258 lb (117 kg)    Physical Exam Vitals and nursing note reviewed.  Constitutional:      General: He is not in acute distress.    Appearance: Normal appearance. He is not ill-appearing, toxic-appearing or diaphoretic.  HENT:     Head: Normocephalic.     Right Ear: External ear normal.     Left Ear: External ear normal.     Nose: Nose normal. No congestion or rhinorrhea.     Mouth/Throat:     Mouth: Mucous membranes are moist.  Eyes:     General:        Right eye: No discharge.        Left eye: No discharge.     Extraocular Movements: Extraocular movements intact.     Conjunctiva/sclera: Conjunctivae normal.     Pupils: Pupils are equal, round, and reactive to light.   Cardiovascular:     Rate and Rhythm: Normal rate and regular  rhythm.     Heart sounds: No murmur heard. Pulmonary:     Effort: Pulmonary effort is normal. No respiratory distress.     Breath sounds: Normal breath sounds. No wheezing, rhonchi or rales.  Abdominal:     General: Abdomen is flat. Bowel sounds are normal.  Musculoskeletal:     Cervical back: Normal range of motion and neck supple.  Skin:    General: Skin is warm and dry.     Capillary Refill: Capillary refill takes less than 2 seconds.  Neurological:     General: No focal deficit present.     Mental Status: He is alert and oriented to person, place, and time.  Psychiatric:        Mood and Affect: Mood normal.        Behavior: Behavior normal.        Thought Content: Thought content normal.        Judgment: Judgment normal.     Results for orders placed or performed in visit on 01/10/22  Microscopic Examination   Urine  Result Value Ref Range   WBC, UA None seen 0 - 5 /hpf   RBC, Urine >30 (H) 0 - 2 /hpf   Epithelial Cells (non renal) 0-10 0 - 10 /hpf   Bacteria, UA None seen None seen/Few  Urine Culture   Specimen: Urine   UR  Result Value Ref Range   Urine Culture, Routine Final report    Organism ID, Bacteria No growth   TSH  Result Value Ref Range   TSH 1.660 0.450 - 4.500 uIU/mL  PSA  Result Value Ref Range   Prostate Specific Ag, Serum 1.7 0.0 - 4.0 ng/mL  Lipid panel  Result Value Ref Range   Cholesterol, Total 175 100 - 199 mg/dL   Triglycerides 121 0 - 149 mg/dL   HDL 38 (L) >39 mg/dL   VLDL Cholesterol Cal 22 5 - 40 mg/dL   LDL Chol Calc (NIH) 115 (H) 0 - 99 mg/dL   Chol/HDL Ratio 4.6 0.0 - 5.0 ratio  CBC with Differential/Platelet  Result Value Ref Range   WBC 4.1 3.4 - 10.8 x10E3/uL   RBC 4.79 4.14 - 5.80 x10E6/uL   Hemoglobin 14.8 13.0 - 17.7 g/dL   Hematocrit 42.7 37.5 - 51.0 %   MCV 89 79 - 97 fL   MCH 30.9 26.6 - 33.0 pg   MCHC 34.7 31.5 - 35.7 g/dL   RDW 12.8 11.6 - 15.4 %    Platelets 176 150 - 450 x10E3/uL   Neutrophils 51 Not Estab. %   Lymphs 34 Not Estab. %   Monocytes 8 Not Estab. %   Eos 5 Not Estab. %   Basos 2 Not Estab. %   Neutrophils Absolute 2.1 1.4 - 7.0 x10E3/uL   Lymphocytes Absolute 1.4 0.7 - 3.1 x10E3/uL   Monocytes Absolute 0.3 0.1 - 0.9 x10E3/uL   EOS (ABSOLUTE) 0.2 0.0 - 0.4 x10E3/uL   Basophils Absolute 0.1 0.0 - 0.2 x10E3/uL   Immature Granulocytes 0 Not Estab. %   Immature Grans (Abs) 0.0 0.0 - 0.1 x10E3/uL  Comprehensive metabolic panel  Result Value Ref Range   Glucose 101 (H) 70 - 99 mg/dL   BUN 13 8 - 27 mg/dL   Creatinine, Ser 0.97 0.76 - 1.27 mg/dL   eGFR 86 >59 mL/min/1.73   BUN/Creatinine Ratio 13 10 - 24   Sodium 141 134 - 144 mmol/L   Potassium 4.5 3.5 - 5.2 mmol/L  Chloride 104 96 - 106 mmol/L   CO2 22 20 - 29 mmol/L   Calcium 9.7 8.6 - 10.2 mg/dL   Total Protein 6.6 6.0 - 8.5 g/dL   Albumin 4.7 3.9 - 4.9 g/dL   Globulin, Total 1.9 1.5 - 4.5 g/dL   Albumin/Globulin Ratio 2.5 (H) 1.2 - 2.2   Bilirubin Total 0.3 0.0 - 1.2 mg/dL   Alkaline Phosphatase 81 44 - 121 IU/L   AST 23 0 - 40 IU/L   ALT 31 0 - 44 IU/L  Urinalysis, Routine w reflex microscopic  Result Value Ref Range   Specific Gravity, UA >1.030 (H) 1.005 - 1.030   pH, UA 6.0 5.0 - 7.5   Color, UA Amber (A) Yellow   Appearance Ur Cloudy (A) Clear   Leukocytes,UA Negative Negative   Protein,UA Negative Negative/Trace   Glucose, UA Negative Negative   Ketones, UA Negative Negative   RBC, UA 3+ (A) Negative   Bilirubin, UA Negative Negative   Urobilinogen, Ur 1.0 0.2 - 1.0 mg/dL   Nitrite, UA Negative Negative   Microscopic Examination See below:   Alpha-Gal Panel  Result Value Ref Range   Class Description Allergens Comment    IgE (Immunoglobulin E), Serum 52 6 - 495 IU/mL   O215-IgE Alpha-Gal 1.49 (A) Class III kU/L   Beef IgE 1.18 (A) Class II kU/L   Pork IgE 0.61 (A) Class II kU/L   Allergen Lamb IgE 0.24 (A) Class 0/I kU/L      Assessment  & Plan:   Problem List Items Addressed This Visit       Cardiovascular and Mediastinum   Essential hypertension   Aortic atherosclerosis (HCC) - Primary   Paroxysmal atrial fibrillation (HCC)     Respiratory   OSA on CPAP     Other   Hyperlipidemia   Obesity (BMI 30-39.9)     Follow up plan: No follow-ups on file.

## 2022-07-13 ENCOUNTER — Ambulatory Visit (INDEPENDENT_AMBULATORY_CARE_PROVIDER_SITE_OTHER): Payer: Medicare HMO | Admitting: Nurse Practitioner

## 2022-07-13 ENCOUNTER — Encounter: Payer: Self-pay | Admitting: Nurse Practitioner

## 2022-07-13 VITALS — BP 124/81 | HR 71 | Temp 97.7°F | Ht 74.5 in | Wt 263.7 lb

## 2022-07-13 DIAGNOSIS — I700 Atherosclerosis of aorta: Secondary | ICD-10-CM

## 2022-07-13 DIAGNOSIS — E669 Obesity, unspecified: Secondary | ICD-10-CM | POA: Diagnosis not present

## 2022-07-13 DIAGNOSIS — G4733 Obstructive sleep apnea (adult) (pediatric): Secondary | ICD-10-CM | POA: Diagnosis not present

## 2022-07-13 DIAGNOSIS — I480 Paroxysmal atrial fibrillation: Secondary | ICD-10-CM | POA: Diagnosis not present

## 2022-07-13 DIAGNOSIS — I10 Essential (primary) hypertension: Secondary | ICD-10-CM

## 2022-07-13 DIAGNOSIS — E785 Hyperlipidemia, unspecified: Secondary | ICD-10-CM

## 2022-07-13 DIAGNOSIS — T781XXA Other adverse food reactions, not elsewhere classified, initial encounter: Secondary | ICD-10-CM

## 2022-07-13 MED ORDER — BENAZEPRIL HCL 5 MG PO TABS: 5.0000 mg | ORAL_TABLET | Freq: Every day | ORAL | 1 refills | Status: DC

## 2022-07-13 MED ORDER — ROSUVASTATIN CALCIUM 10 MG PO TABS: 10.0000 mg | ORAL_TABLET | Freq: Every day | ORAL | 1 refills | Status: DC

## 2022-07-13 NOTE — Assessment & Plan Note (Signed)
Chronic.  Controlled.  Has been eating some red meat and pork and doing well.  Followed by Dr. Maisie Fus.  Labs ordered today.  Return to clinic in 6 months for reevaluation.  Call sooner if concerns arise.

## 2022-07-13 NOTE — Assessment & Plan Note (Signed)
Controlled.  Continue with Crestor '10mg'$  daily.  Refills sent today.   Follow up in 6 months. Call sooner if concerns arise.

## 2022-07-13 NOTE — Assessment & Plan Note (Signed)
Chronic.  Controlled.  Continue with current medication regimen.  Endorses 100% use of machine.  Labs ordered today.  Return to clinic in 6 months for reevaluation.  Call sooner if concerns arise.

## 2022-07-13 NOTE — Assessment & Plan Note (Signed)
Chronic.  Controlled.  Continue with current medication regimen of Benzapril '5mg'$  daily.  Refills sent today.  Labs ordered today.  Return to clinic in 6 months for reevaluation.  Call sooner if concerns arise.

## 2022-07-13 NOTE — Assessment & Plan Note (Signed)
Chronic.  Controlled.  Continue with current medication regimen.  Labs ordered today.  Return to clinic in 6 months for reevaluation.  Call sooner if concerns arise.  Follow by Cardiology.  New referral placed due to previous Cardiologist retiring.

## 2022-07-14 ENCOUNTER — Other Ambulatory Visit: Payer: Self-pay | Admitting: Nurse Practitioner

## 2022-07-14 DIAGNOSIS — H903 Sensorineural hearing loss, bilateral: Secondary | ICD-10-CM | POA: Diagnosis not present

## 2022-07-14 DIAGNOSIS — H9313 Tinnitus, bilateral: Secondary | ICD-10-CM | POA: Diagnosis not present

## 2022-07-14 NOTE — Telephone Encounter (Signed)
[place] called and spoke to [place], [facility] about the refill(s) benazepril and rosuvastatin requested. Advised it was sent on 07/13/22 #90/1 refill(s) for both rxs. [place] states they were received and already filled. No further assistance needed.

## 2022-07-14 NOTE — Telephone Encounter (Signed)
Rxs were sent to pharmacy, received and already filled. Has 1 RF remaining.   Requested Prescriptions  Refused Prescriptions Disp Refills   benazepril (LOTENSIN) 5 MG tablet [Pharmacy Med Name: BENAZEPRIL HCL 5 MG TABLET] 90 tablet 1    Sig: TAKE 1 TABLET BY MOUTH DAILY     Cardiovascular:  ACE Inhibitors Failed - 07/14/2022  6:23 AM      Failed - Cr in normal range and within 180 days    Creatinine  Date Value Ref Range Status  09/01/2012 1.19 0.60 - 1.30 mg/dL Final   Creatinine, Ser  Date Value Ref Range Status  07/13/2022 0.81 0.76 - 1.27 mg/dL Final         Failed - K in normal range and within 180 days    Potassium  Date Value Ref Range Status  07/13/2022 4.3 3.5 - 5.2 mmol/L Final  09/01/2012 3.9 3.5 - 5.1 mmol/L Final         Passed - Patient is not pregnant      Passed - Last BP in normal range    BP Readings from Last 1 Encounters:  07/13/22 124/81         Passed - Valid encounter within last 6 months    Recent Outpatient Visits           Yesterday Aortic atherosclerosis [facility])   [place] Jon Billings, NP   4 months ago Acute non-recurrent frontal sinusitis   [place] Jon Billings, NP   6 months ago Annual physical exam   [place] Jon Billings, NP   1 year ago Aortic atherosclerosis ([place])   [place] Jon Billings, NP   1 year ago Diarrhea, unspecified type   [place] McElwee, Scheryl Darter, NP       Future Appointments             In 6 months Jon Billings, NP [place]             rosuvastatin (CRESTOR) 10 MG tablet [Pharmacy Med Name: ROSUVASTATIN CALCIUM 10 MG TAB] 90 tablet 1    Sig: TAKE 1 TABLET BY MOUTH DAILY     Cardiovascular:  Antilipid - Statins 2 Failed - 07/14/2022  6:23 AM      Failed - Lipid Panel in normal range within the last 12 months     Cholesterol, Total  Date Value Ref Range Status  07/13/2022 121 100 - 199 mg/dL Final   Cholesterol  Date Value Ref Range Status  09/01/2012 142 0 - 200 mg/dL Final   Cholesterol Piccolo, Waived  Date Value Ref Range Status  05/01/2017 194 <200 mg/dL Final    Comment:                            Desirable                <200                         Borderline High      200- 239                         High                     >  239    Ldl Cholesterol, Calc  Date Value Ref Range Status  09/01/2012 90 0 - 100 mg/dL Final   LDL Chol Calc (NIH)  Date Value Ref Range Status  07/13/2022 62 0 - 99 mg/dL Final   HDL Cholesterol  Date Value Ref Range Status  09/01/2012 29 (L) 40 - 60 mg/dL Final   HDL  Date Value Ref Range Status  07/13/2022 40 >39 mg/dL Final   Triglycerides  Date Value Ref Range Status  07/13/2022 102 0 - 149 mg/dL Final  09/01/2012 116 0 - 200 mg/dL Final   Triglycerides Piccolo,Waived  Date Value Ref Range Status  05/01/2017 123 <150 mg/dL Final    Comment:                            Normal                   <150                         Borderline High     150 - 199                         High                200 - 499                         Very High                >499          Passed - Cr in normal range and within 360 days    Creatinine  Date Value Ref Range Status  09/01/2012 1.19 0.60 - 1.30 mg/dL Final   Creatinine, Ser  Date Value Ref Range Status  07/13/2022 0.81 0.76 - 1.27 mg/dL Final         Passed - Patient is not pregnant      Passed - Valid encounter within last 12 months    Recent Outpatient Visits           Yesterday Aortic atherosclerosis ([place])   [place], Karen, NP   4 months ago Acute non-recurrent frontal sinusitis   [place], Karen, NP   6 months ago Annual physical exam   [place] Jon Billings, NP   1  year ago Aortic atherosclerosis [facility])   [place] Jon Billings, NP   1 year ago Diarrhea, unspecified type   [place] Charyl Dancer, NP       Future Appointments             In 6 months Jon Billings, NP [place]

## 2022-07-16 LAB — LIPID PANEL
Chol/HDL Ratio: 3 ratio (ref 0.0–5.0)
Cholesterol, Total: 121 mg/dL (ref 100–199)
HDL: 40 mg/dL (ref >39)
LDL Chol Calc (NIH): 62 mg/dL (ref 0–99)
Triglycerides: 102 mg/dL (ref 0–149)
VLDL Cholesterol Cal: 19 mg/dL (ref 5–40)

## 2022-07-16 LAB — COMPREHENSIVE METABOLIC PANEL
ALT: 37 IU/L (ref 0–44)
AST: 23 IU/L (ref 0–40)
Albumin/Globulin Ratio: 1.8 (ref 1.2–2.2)
Albumin: 4.4 g/dL (ref 3.9–4.9)
Alkaline Phosphatase: 78 IU/L (ref 44–121)
BUN/Creatinine Ratio: 22 (ref 10–24)
BUN: 18 mg/dL (ref 8–27)
Bilirubin Total: 0.5 mg/dL (ref 0.0–1.2)
CO2: 23 mmol/L (ref 20–29)
Calcium: 9.3 mg/dL (ref 8.6–10.2)
Chloride: 106 mmol/L (ref 96–106)
Creatinine, Ser: 0.81 mg/dL (ref 0.76–1.27)
Globulin, Total: 2.4 g/dL (ref 1.5–4.5)
Glucose: 99 mg/dL (ref 70–99)
Potassium: 4.3 mmol/L (ref 3.5–5.2)
Sodium: 143 mmol/L (ref 134–144)
Total Protein: 6.8 g/dL (ref 6.0–8.5)
eGFR: 97 mL/min/{1.73_m2} (ref >59)

## 2022-07-16 LAB — ALPHA-GAL PANEL
Allergen Lamb IgE: 0.15 kU/L — AB
Beef IgE: 0.42 kU/L — AB
IgE (Immunoglobulin E), Serum: 38 IU/mL (ref 6–495)
O215-IgE Alpha-Gal: 0.65 kU/L — AB
Pork IgE: 0.29 kU/L — AB

## 2022-07-18 NOTE — Progress Notes (Signed)
Hi Thomas Mercado. It was nice to see you last week.  Your lab work looks good.  Your Alpha gal numbers are still abnormal but improved a good but from prior.  No concerns at this time. Continue with your current medication regimen.  Follow up as discussed.  Please let me know if you have any questions.

## 2022-07-30 ENCOUNTER — Encounter: Payer: Self-pay | Admitting: Nurse Practitioner

## 2022-08-01 ENCOUNTER — Encounter: Payer: Self-pay | Admitting: Nurse Practitioner

## 2022-08-02 ENCOUNTER — Encounter: Payer: Self-pay | Admitting: Nurse Practitioner

## 2022-08-02 ENCOUNTER — Ambulatory Visit (INDEPENDENT_AMBULATORY_CARE_PROVIDER_SITE_OTHER): Payer: Medicare HMO | Admitting: Nurse Practitioner

## 2022-08-02 VITALS — BP 130/76 | HR 82 | Temp 98.0°F | Wt 267.2 lb

## 2022-08-02 DIAGNOSIS — R319 Hematuria, unspecified: Secondary | ICD-10-CM | POA: Diagnosis not present

## 2022-08-02 DIAGNOSIS — N3001 Acute cystitis with hematuria: Secondary | ICD-10-CM | POA: Diagnosis not present

## 2022-08-02 MED ORDER — CIPROFLOXACIN HCL 500 MG PO TABS: 500.0000 mg | ORAL_TABLET | Freq: Two times a day (BID) | ORAL | 0 refills | Status: AC

## 2022-08-02 NOTE — Assessment & Plan Note (Addendum)
Acute, started last week. Will check UA, C&S, CBC, CMP, PSA. Will perform urine dipstick in office today. Discussed drinking plenty of fluids and will notify of lab results.

## 2022-08-02 NOTE — Progress Notes (Deleted)
   There were no vitals taken for this visit.   Subjective:    Patient ID: Thomas Mercado, male    DOB: [birth year], 67 y.o.   MRN: [id number]  HPI: Thomas Mercado is a 67 y.o. male  No chief complaint on file.  URINARY SYMPTOMS  Dysuria: {Blank single:19197::"yes","no","burning"} Urinary frequency: {Blank single:19197::"yes","no"} Urgency: {Blank single:19197::"yes","no"} Small volume voids: {Blank single:19197::"yes","no"} Symptom severity: {Blank single:19197::"yes","no"} Urinary incontinence: {Blank single:19197::"yes","no"} Foul odor: {Blank single:19197::"yes","no"} Hematuria: {Blank single:19197::"yes","no"} Abdominal pain: {Blank single:19197::"yes","no"} Back pain: {Blank single:19197::"yes","no"} Suprapubic pain/pressure: {Blank single:19197::"yes","no"} Flank pain: {Blank single:19197::"yes","no"} Fever:  {Blank multiple:19196::"yes","no","subjective","low grade"} Vomiting: {Blank single:19197::"yes","no"} Relief with cranberry juice: {Blank single:19197::"yes","no"} Relief with pyridium: {Blank single:19197::"yes","no"} Status: better/worse/stable Previous urinary tract infection: {Blank single:19197::"yes","no"} Recurrent urinary tract infection: {Blank single:19197::"yes","no"} Sexual activity: No sexually active/monogomous/practicing safe sex History of sexually transmitted disease: {Blank single:19197::"yes","no"} Penile discharge: {Blank single:19197::"yes","no"} Treatments attempted: {Blank multiple:19196::"none","antibiotics","pyridium","cranberry","increasing fluids"}   Relevant past medical, surgical, family and social history reviewed and updated as indicated. Interim medical history since our last visit reviewed. Allergies and medications reviewed and updated.  Review of Systems  Per HPI unless specifically indicated above     Objective:    There were no vitals taken for this visit.  Wt Readings from Last 3 Encounters:  07/13/22 263 lb 11.2 oz (119.6  kg)  04/20/22 256 lb 9.6 oz (116.4 kg)  03/09/22 255 lb 3.2 oz (115.8 kg)    Physical Exam  Results for orders placed or performed in visit on 07/13/22  Comp Met (CMET)  Result Value Ref Range   Glucose 99 70 - 99 mg/dL   BUN 18 8 - 27 mg/dL   Creatinine, Ser 0.81 0.76 - 1.27 mg/dL   eGFR 97 >59 mL/min/1.73   BUN/Creatinine Ratio 22 10 - 24   Sodium 143 134 - 144 mmol/L   Potassium 4.3 3.5 - 5.2 mmol/L   Chloride 106 96 - 106 mmol/L   CO2 23 20 - 29 mmol/L   Calcium 9.3 8.6 - 10.2 mg/dL   Total Protein 6.8 6.0 - 8.5 g/dL   Albumin 4.4 3.9 - 4.9 g/dL   Globulin, Total 2.4 1.5 - 4.5 g/dL   Albumin/Globulin Ratio 1.8 1.2 - 2.2   Bilirubin Total 0.5 0.0 - 1.2 mg/dL   Alkaline Phosphatase 78 44 - 121 IU/L   AST 23 0 - 40 IU/L   ALT 37 0 - 44 IU/L  Lipid Profile  Result Value Ref Range   Cholesterol, Total 121 100 - 199 mg/dL   Triglycerides 102 0 - 149 mg/dL   HDL 40 >39 mg/dL   VLDL Cholesterol Cal 19 5 - 40 mg/dL   LDL Chol Calc (NIH) 62 0 - 99 mg/dL   Chol/HDL Ratio 3.0 0.0 - 5.0 ratio  Alpha-Gal Panel  Result Value Ref Range   Class Description Allergens Comment    IgE (Immunoglobulin E), Serum 38 6 - 495 IU/mL   O215-IgE Alpha-Gal 0.65 (A) Class II kU/L   Beef IgE 0.42 (A) Class I kU/L   Pork IgE 0.29 (A) Class 0/I kU/L   Allergen Lamb IgE 0.15 (A) Class 0/I kU/L      Assessment & Plan:   Problem List Items Addressed This Visit   None    Follow up plan: No follow-ups on file.

## 2022-08-02 NOTE — Assessment & Plan Note (Addendum)
Acute, started last week. Will check UA, C&S, CBC, CMP, PSA. Urine dip positive for RBC's. Discussed drinking plenty of fluids and will notify of lab results. Cipro 525m BID for 5 days ordered.

## 2022-08-02 NOTE — Progress Notes (Signed)
BP 130/76   Pulse 82   Temp 98 F (36.7 C) (Oral)   Wt 267 lb 3.2 oz (121.2 kg)   SpO2 98%   BMI 33.85 kg/m    Subjective:    Patient ID: Thomas Mercado, male    DOB: 04-May-1956, 67 y.o.   MRN: [id number]  HPI: Thomas Mercado is a 67 y.o. male  NOTE WRITTEN BY DNP STUDENT.  ASSESSMENT AND PLAN OF CARE REVIEWED WITH STUDENT, AGREE WITH ABOVE FINDINGS AND PLAN.   Chief Complaint  Patient presents with   Hematuria    Pt states he has been noticing off and on blood in his urine for the past week. States he has not been experiencing any pain or burning with urination.     Pt noticed blood in his urine last week. He reports the amount of blood he sees varies and he does not always see blood when he urinates, it is off and on. He reports he has not seen any blood in his urine today. Pt reports seeing the blood in the toilet and denies seeing any blood in his underwear prior to urinating. He also reports seeing a blood clot in the toilet yesterday after urinating but has not seen any since. Pt denies any other penile discharge.  URINARY SYMPTOMS  Dysuria: no Urinary frequency: no Urgency: no Small volume voids: no Symptom severity: no Urinary incontinence: no Foul odor: no Hematuria: yes Abdominal pain:  pt reports an abdominal discomfort (likely due to alpha gal) that he has had since starting back eating red meats/pork but denies worsening abdominal pain or symptoms since hematuria started Back pain: no Suprapubic pain/pressure: no Flank pain: no Fever:  no Vomiting: no Relief with cranberry juice: no Relief with pyridium: no Status: better/worse/stable Previous urinary tract infection: no Recurrent urinary tract infection: no Sexual activity: No sexually active/monogomous/practicing safe sex History of sexually transmitted disease: no Penile discharge: no Treatments attempted: none   Relevant past medical, surgical, family and social history reviewed and updated as  indicated. Interim medical history since our last visit reviewed. Allergies and medications reviewed and updated.  Review of Systems  Constitutional:  Negative for appetite change, chills, diaphoresis, fatigue and fever.  Respiratory:  Negative for shortness of breath.   Cardiovascular:  Negative for chest pain, palpitations and leg swelling.  Gastrointestinal:  Positive for abdominal pain. Negative for nausea and vomiting.  Endocrine: Negative for polyuria.  Genitourinary:  Positive for hematuria. Negative for decreased urine volume, difficulty urinating, dysuria, flank pain, frequency, penile discharge, penile pain and urgency.  Musculoskeletal:  Negative for back pain.  Psychiatric/Behavioral:  Negative for sleep disturbance. The patient is not nervous/anxious.     Per HPI unless specifically indicated above     Objective:    BP 130/76   Pulse 82   Temp 98 F (36.7 C) (Oral)   Wt 267 lb 3.2 oz (121.2 kg)   SpO2 98%   BMI 33.85 kg/m   Wt Readings from Last 3 Encounters:  08/02/22 267 lb 3.2 oz (121.2 kg)  07/13/22 263 lb 11.2 oz (119.6 kg)  04/20/22 256 lb 9.6 oz (116.4 kg)    Physical Exam Vitals and nursing note reviewed.  Constitutional:      General: He is not in acute distress.    Appearance: Normal appearance. He is normal weight. He is not ill-appearing, toxic-appearing or diaphoretic.  HENT:     Head: Normocephalic.  Cardiovascular:     Rate and Rhythm: Normal  rate and regular rhythm.     Heart sounds: No murmur heard.    No gallop.  Pulmonary:     Effort: Pulmonary effort is normal.     Breath sounds: Normal breath sounds.  Abdominal:     General: There is no distension.     Palpations: Abdomen is soft.     Tenderness: There is no abdominal tenderness. There is no right CVA tenderness, left CVA tenderness, guarding or rebound.  Musculoskeletal:        General: Normal range of motion.     Cervical back: Normal range of motion and neck supple.  Skin:     General: Skin is warm.  Neurological:     General: No focal deficit present.     Mental Status: He is alert and oriented to person, place, and time. Mental status is at baseline.  Psychiatric:        Mood and Affect: Mood normal.        Behavior: Behavior normal.        Thought Content: Thought content normal.        Judgment: Judgment normal.     Results for orders placed or performed in visit on 07/13/22  Comp Met (CMET)  Result Value Ref Range   Glucose 99 70 - 99 mg/dL   BUN 18 8 - 27 mg/dL   Creatinine, Ser 0.81 0.76 - 1.27 mg/dL   eGFR 97 >59 mL/min/1.73   BUN/Creatinine Ratio 22 10 - 24   Sodium 143 134 - 144 mmol/L   Potassium 4.3 3.5 - 5.2 mmol/L   Chloride 106 96 - 106 mmol/L   CO2 23 20 - 29 mmol/L   Calcium 9.3 8.6 - 10.2 mg/dL   Total Protein 6.8 6.0 - 8.5 g/dL   Albumin 4.4 3.9 - 4.9 g/dL   Globulin, Total 2.4 1.5 - 4.5 g/dL   Albumin/Globulin Ratio 1.8 1.2 - 2.2   Bilirubin Total 0.5 0.0 - 1.2 mg/dL   Alkaline Phosphatase 78 44 - 121 IU/L   AST 23 0 - 40 IU/L   ALT 37 0 - 44 IU/L  Lipid Profile  Result Value Ref Range   Cholesterol, Total 121 100 - 199 mg/dL   Triglycerides 102 0 - 149 mg/dL   HDL 40 >39 mg/dL   VLDL Cholesterol Cal 19 5 - 40 mg/dL   LDL Chol Calc (NIH) 62 0 - 99 mg/dL   Chol/HDL Ratio 3.0 0.0 - 5.0 ratio  Alpha-Gal Panel  Result Value Ref Range   Class Description Allergens Comment    IgE (Immunoglobulin E), Serum 38 6 - 495 IU/mL   O215-IgE Alpha-Gal 0.65 (A) Class II kU/L   Beef IgE 0.42 (A) Class I kU/L   Pork IgE 0.29 (A) Class 0/I kU/L   Allergen Lamb IgE 0.15 (A) Class 0/I kU/L      Assessment & Plan:   Problem List Items Addressed This Visit       Genitourinary   Acute cystitis with hematuria - Primary    Acute, started last week. Will check UA, C&S, CBC, CMP, PSA. Urine dip positive for RBC's. Discussed drinking plenty of fluids and will notify of lab results. Cipro 555m BID for 5 days ordered.       Relevant  Orders   Comp Met (CMET)   PSA   CBC w/Diff   Urine Culture   Urinalysis, Routine w reflex microscopic     Follow up plan: No follow-ups on file.

## 2022-08-03 LAB — CBC WITH DIFFERENTIAL/PLATELET
Basophils Absolute: 0.1 10*3/uL (ref 0.0–0.2)
Basos: 1 %
EOS (ABSOLUTE): 0.2 10*3/uL (ref 0.0–0.4)
Eos: 3 %
Hematocrit: 41.4 % (ref 37.5–51.0)
Hemoglobin: 14.1 g/dL (ref 13.0–17.7)
Immature Grans (Abs): 0 10*3/uL (ref 0.0–0.1)
Immature Granulocytes: 0 %
Lymphocytes Absolute: 1.8 10*3/uL (ref 0.7–3.1)
Lymphs: 36 %
MCH: 30 pg (ref 26.6–33.0)
MCHC: 34.1 g/dL (ref 31.5–35.7)
MCV: 88 fL (ref 79–97)
Monocytes Absolute: 0.5 10*3/uL (ref 0.1–0.9)
Monocytes: 11 %
Neutrophils Absolute: 2.5 10*3/uL (ref 1.4–7.0)
Neutrophils: 49 %
Platelets: 185 10*3/uL (ref 150–450)
RBC: 4.7 x10E6/uL (ref 4.14–5.80)
RDW: 13.1 % (ref 11.6–15.4)
WBC: 5.1 10*3/uL (ref 3.4–10.8)

## 2022-08-03 LAB — MICROSCOPIC EXAMINATION
Bacteria, UA: NONE SEEN
Casts: NONE SEEN /lpf
Epithelial Cells (non renal): NONE SEEN /hpf (ref 0–10)
RBC, Urine: >30 /hpf — AB (ref 0–2)
WBC, UA: NONE SEEN /hpf (ref 0–5)

## 2022-08-03 LAB — URINALYSIS, ROUTINE W REFLEX MICROSCOPIC
Bilirubin, UA: NEGATIVE
Glucose, UA: NEGATIVE
Ketones, UA: NEGATIVE
Leukocytes,UA: NEGATIVE
Nitrite, UA: NEGATIVE
Protein,UA: NEGATIVE
Specific Gravity, UA: 1.013 (ref 1.005–1.030)
Urobilinogen, Ur: 0.2 mg/dL (ref 0.2–1.0)
pH, UA: 6.5 (ref 5.0–7.5)

## 2022-08-03 LAB — COMPREHENSIVE METABOLIC PANEL
ALT: 34 IU/L (ref 0–44)
AST: 20 IU/L (ref 0–40)
Albumin/Globulin Ratio: 2.3 — ABNORMAL HIGH (ref 1.2–2.2)
Albumin: 4.9 g/dL (ref 3.9–4.9)
Alkaline Phosphatase: 85 IU/L (ref 44–121)
BUN/Creatinine Ratio: 20 (ref 10–24)
BUN: 17 mg/dL (ref 8–27)
Bilirubin Total: 0.4 mg/dL (ref 0.0–1.2)
CO2: 23 mmol/L (ref 20–29)
Calcium: 9.5 mg/dL (ref 8.6–10.2)
Chloride: 105 mmol/L (ref 96–106)
Creatinine, Ser: 0.85 mg/dL (ref 0.76–1.27)
Globulin, Total: 2.1 g/dL (ref 1.5–4.5)
Glucose: 86 mg/dL (ref 70–99)
Potassium: 4.5 mmol/L (ref 3.5–5.2)
Sodium: 142 mmol/L (ref 134–144)
Total Protein: 7 g/dL (ref 6.0–8.5)
eGFR: 95 mL/min/{1.73_m2} (ref >59)

## 2022-08-03 LAB — PSA: Prostate Specific Ag, Serum: 1.5 ng/mL (ref 0.0–4.0)

## 2022-08-03 NOTE — Progress Notes (Signed)
Hi Horald.  Your lab work looks great.  PSA remains in the normal range.  No evidence of infection.  Complete the course of ciprofloxacin and I am going to place a referral to Urology for further evaluation.

## 2022-08-03 NOTE — Addendum Note (Signed)
Addended by: Jon Billings on: 08/03/2022 12:25 PM   Modules accepted: Orders

## 2022-08-05 LAB — URINE CULTURE: Organism ID, Bacteria: NO GROWTH

## 2022-08-05 NOTE — Progress Notes (Signed)
HI Dolph. No growth on your urine culture.  I still recommend seeing Urology.  Please let me know if you have any questions.

## 2022-08-10 ENCOUNTER — Other Ambulatory Visit
Admission: RE | Admit: 2022-08-10 | Discharge: 2022-08-10 | Disposition: A | Discharge status: Home / Self Care | Payer: Medicare HMO | Attending: Urology | Admitting: Urology

## 2022-08-10 ENCOUNTER — Other Ambulatory Visit: Payer: Self-pay

## 2022-08-10 ENCOUNTER — Encounter: Payer: Self-pay | Admitting: Urology

## 2022-08-10 ENCOUNTER — Ambulatory Visit: Payer: Medicare HMO | Admitting: Urology

## 2022-08-10 VITALS — BP 138/86 | HR 82 | Ht 75.0 in | Wt 264.0 lb

## 2022-08-10 DIAGNOSIS — R319 Hematuria, unspecified: Secondary | ICD-10-CM

## 2022-08-10 DIAGNOSIS — R310 Gross hematuria: Secondary | ICD-10-CM

## 2022-08-10 LAB — URINALYSIS, COMPLETE (UACMP) WITH MICROSCOPIC
Bilirubin Urine: NEGATIVE
Glucose, UA: NEGATIVE mg/dL
Hgb urine dipstick: NEGATIVE
Ketones, ur: NEGATIVE mg/dL
Leukocytes,Ua: NEGATIVE
Nitrite: NEGATIVE
Protein, ur: NEGATIVE mg/dL
Specific Gravity, Urine: 1.01 (ref 1.005–1.030)
pH: 7 (ref 5.0–8.0)

## 2022-08-10 NOTE — Patient Instructions (Signed)

## 2022-08-10 NOTE — Progress Notes (Signed)
08/10/22 11:31 AM   Annalee Genta Rick Duff 1955/07/04 [id number]  CC: Gross hematuria  HPI: 67 year old healthy male who reports 2 weeks of intermittent painless gross hematuria.  Urinalysis on 08/02/2022 showed > 30 RBC, and culture was negative.  Looking back he actually had >30 RBC on a urinalysis from July 2023.  PSA has been normal, most recently 1.5.  He denies any significant flank pain, has had some very brief right-sided aching on the right side but nothing significant.  No history of kidney stones, non-smoker, denies any weight loss.  Denies any blood thinners.  He denies any prior abdominal surgeries  Urinalysis today is benign  PMH: Past Medical History:  Diagnosis Date   Allergy    Aortic atherosclerosis ([place]) 07/31/2017   Chest x-ray February 2019   Atrial fibrillation [facility]   Atrial fibrillation ([place])    CPAP (continuous positive airway pressure) dependence    Dysrhythmia    ED (erectile dysfunction)    GERD (gastroesophageal reflux disease)    Heart murmur    Hyperlipidemia    Hypertension    Sleep apnea    Sleep apnea     Surgical History: Past Surgical History:  Procedure Laterality Date   ABLATION  02/2013   heart   ABLATION  12/2015   heart   BASAL CELL CARCINOMA EXCISION Left 06/29/2022   left foot   COLONOSCOPY WITH PROPOFOL N/A 10/23/2015   Procedure: COLONOSCOPY WITH PROPOFOL;  Surgeon: Lollie Sails, MD;  Location: [facility] ENDOSCOPY;  Service: Endoscopy;  Laterality: N/A;   KNEE ARTHROSCOPY Left 04/09/2015   Procedure: ,left knee arthroscopy, limited synovectomy and partial medial menisectomy.;  Surgeon: Thornton Park, MD;  Location: [facility] ORS;  Service: Orthopedics;  Laterality: Left;   SHOULDER ARTHROSCOPY WITH OPEN ROTATOR CUFF REPAIR AND DISTAL CLAVICLE ACROMINECTOMY Left 05/20/2021   Procedure: SHOULDER ARTHROSCOPY WITH OPEN ROTATOR CUFF REPAIR AND DISTAL CLAVICLE ACROMINECTOMY;  Surgeon: Thornton Park,  MD;  Location: [facility] ORS;  Service: Orthopedics;  Laterality: Left;   VARICOCELE EXCISION       Family History: Family History  Problem Relation Age of Onset   Breast cancer Mother    Alzheimer's disease Father    Hypertension Father    Arthritis Father    Heart disease Brother    Emphysema Brother     Social History:  reports that he has never smoked. He has never used smokeless tobacco. He reports current alcohol use. He reports that he does not use drugs.  Physical Exam: BP 138/86 (BP Location: Left Arm, Patient Position: Sitting, Cuff Size: Large)   Pulse 82   Ht 6' 3"$  (1.905 m)   Wt 264 lb (119.7 kg)   SpO2 98%   BMI 33.00 kg/m    Constitutional:  Alert and oriented, No acute distress. Cardiovascular: No clubbing, cyanosis, or edema. Respiratory: Normal respiratory effort, no increased work of breathing. GI: Abdomen is soft, nontender, nondistended, no abdominal masses  Assessment & Plan:   67 year old male with 2 weeks of painless gross hematuria, and microscopic hematuria since at least July 2023  We discussed common possible etiologies of hematuria including BPH, malignancy, urolithiasis, medical renal disease, and idiopathic. Standard workup recommended by the [facility] includes imaging with CT urogram to assess the upper tracts, and cystoscopy. Cytology is performed on patient's with gross hematuria to look for malignant cells in the urine.  CT urogram and cystoscopy for further evaluation of gross hematuria  Nickolas Madrid, MD  08/10/2022  [facility] [address] [place], [place] [address] [phone]

## 2022-08-18 ENCOUNTER — Ambulatory Visit
Admission: RE | Admit: 2022-08-18 | Discharge: 2022-08-18 | Disposition: A | Discharge status: Home / Self Care | Payer: Medicare HMO | Source: Ambulatory Visit | Attending: Urology | Admitting: Urology

## 2022-08-18 DIAGNOSIS — R310 Gross hematuria: Secondary | ICD-10-CM | POA: Diagnosis not present

## 2022-08-18 DIAGNOSIS — N200 Calculus of kidney: Secondary | ICD-10-CM | POA: Diagnosis not present

## 2022-08-18 MED ORDER — IOHEXOL 300 MG/ML  SOLN
100.0000 mL | Freq: Once | INTRAMUSCULAR | Status: AC | PRN
  Administered 2022-08-18: 100 mL via INTRAVENOUS

## 2022-08-29 ENCOUNTER — Other Ambulatory Visit: Payer: Self-pay

## 2022-08-29 DIAGNOSIS — R310 Gross hematuria: Secondary | ICD-10-CM

## 2022-08-30 ENCOUNTER — Other Ambulatory Visit
Admission: RE | Admit: 2022-08-30 | Discharge: 2022-08-30 | Disposition: A | Discharge status: Home / Self Care | Payer: Medicare HMO | Attending: Urology | Admitting: Urology

## 2022-08-30 ENCOUNTER — Ambulatory Visit: Payer: Medicare HMO | Admitting: Urology

## 2022-08-30 ENCOUNTER — Encounter: Payer: Self-pay | Admitting: Urology

## 2022-08-30 VITALS — BP 142/78 | HR 90 | Ht 75.0 in | Wt 264.0 lb

## 2022-08-30 DIAGNOSIS — R310 Gross hematuria: Secondary | ICD-10-CM | POA: Insufficient documentation

## 2022-08-30 DIAGNOSIS — Q441 Other congenital malformations of gallbladder: Secondary | ICD-10-CM

## 2022-08-30 LAB — URINALYSIS, COMPLETE (UACMP) WITH MICROSCOPIC
Bilirubin Urine: NEGATIVE
Glucose, UA: NEGATIVE mg/dL
Ketones, ur: NEGATIVE mg/dL
Leukocytes,Ua: NEGATIVE
Nitrite: NEGATIVE
Protein, ur: NEGATIVE mg/dL
RBC / HPF: >50 RBC/hpf (ref 0–5)
Specific Gravity, Urine: 1.01 (ref 1.005–1.030)
Squamous Epithelial / HPF: NONE SEEN /HPF (ref 0–5)
WBC, UA: NONE SEEN WBC/hpf (ref 0–5)
pH: 6.5 (ref 5.0–8.0)

## 2022-08-30 MED ORDER — LIDOCAINE HCL URETHRAL/MUCOSAL 2 % EX GEL
1.0000 | Freq: Once | CUTANEOUS | Status: AC
  Administered 2022-08-30: 1 via URETHRAL

## 2022-08-30 MED ORDER — CEPHALEXIN 250 MG PO CAPS
500.0000 mg | ORAL_CAPSULE | Freq: Once | ORAL | Status: AC
  Administered 2022-08-30: 500 mg via ORAL

## 2022-08-30 NOTE — Progress Notes (Signed)
Cystoscopy Procedure Note:  Indication: Gross hematuria  Keflex given for prophylaxis  After informed consent and discussion of the procedure and its risks, Thomas Mercado was positioned and prepped in the standard fashion. Cystoscopy was performed with a flexible cystoscope. The urethra, bladder neck and entire bladder was visualized in a standard fashion. The prostate was large with a median lobe/intravesical component. The ureteral orifices were visualized in their normal location and orientation.  Mild trabeculations, no suspicious lesions, large median lobe of the prostate on retroflexion.  Cytology sent  Imaging: CT with no urologic abnormalities, 5 mm gallbladder polyp and right upper quadrant ultrasound recommended(ordered)  Findings: Normal cystoscopy  Assessment and Plan: Normal cystoscopy, follow-up cytology results Consider finasteride in the future if recurrent hematuria felt to be secondary to BPH-denies any voiding symptoms today Right upper quadrant ultrasound ordered for further evaluation of gallbladder polyp, call with results  Nickolas Madrid, MD 08/30/2022

## 2022-09-05 ENCOUNTER — Ambulatory Visit
Admission: RE | Admit: 2022-09-05 | Discharge: 2022-09-05 | Disposition: A | Discharge status: Home / Self Care | Payer: Medicare HMO | Source: Ambulatory Visit | Attending: Urology | Admitting: Urology

## 2022-09-05 DIAGNOSIS — K824 Cholesterolosis of gallbladder: Secondary | ICD-10-CM | POA: Diagnosis not present

## 2022-09-05 DIAGNOSIS — Q441 Other congenital malformations of gallbladder: Secondary | ICD-10-CM | POA: Insufficient documentation

## 2022-09-05 LAB — CYTOLOGY - NON PAP

## 2022-09-29 DIAGNOSIS — D225 Melanocytic nevi of trunk: Secondary | ICD-10-CM | POA: Diagnosis not present

## 2022-09-29 DIAGNOSIS — L578 Other skin changes due to chronic exposure to nonionizing radiation: Secondary | ICD-10-CM | POA: Diagnosis not present

## 2022-09-29 DIAGNOSIS — L814 Other melanin hyperpigmentation: Secondary | ICD-10-CM | POA: Diagnosis not present

## 2022-09-29 DIAGNOSIS — L570 Actinic keratosis: Secondary | ICD-10-CM | POA: Diagnosis not present

## 2022-09-29 DIAGNOSIS — C44212 Basal cell carcinoma of skin of right ear and external auricular canal: Secondary | ICD-10-CM | POA: Diagnosis not present

## 2022-09-29 DIAGNOSIS — Z859 Personal history of malignant neoplasm, unspecified: Secondary | ICD-10-CM | POA: Diagnosis not present

## 2022-09-29 DIAGNOSIS — L738 Other specified follicular disorders: Secondary | ICD-10-CM | POA: Diagnosis not present

## 2022-09-29 DIAGNOSIS — Z872 Personal history of diseases of the skin and subcutaneous tissue: Secondary | ICD-10-CM | POA: Diagnosis not present

## 2022-09-29 DIAGNOSIS — D485 Neoplasm of uncertain behavior of skin: Secondary | ICD-10-CM | POA: Diagnosis not present

## 2022-09-29 DIAGNOSIS — Z85828 Personal history of other malignant neoplasm of skin: Secondary | ICD-10-CM | POA: Diagnosis not present

## 2022-10-19 DIAGNOSIS — C4491 Basal cell carcinoma of skin, unspecified: Secondary | ICD-10-CM | POA: Diagnosis not present

## 2022-10-19 DIAGNOSIS — C44212 Basal cell carcinoma of skin of right ear and external auricular canal: Secondary | ICD-10-CM | POA: Diagnosis not present

## 2022-10-26 DIAGNOSIS — I4892 Unspecified atrial flutter: Secondary | ICD-10-CM | POA: Diagnosis not present

## 2022-10-26 DIAGNOSIS — E785 Hyperlipidemia, unspecified: Secondary | ICD-10-CM | POA: Diagnosis not present

## 2022-10-26 DIAGNOSIS — I4891 Unspecified atrial fibrillation: Secondary | ICD-10-CM | POA: Diagnosis not present

## 2022-10-26 DIAGNOSIS — G4733 Obstructive sleep apnea (adult) (pediatric): Secondary | ICD-10-CM | POA: Diagnosis not present

## 2022-10-26 DIAGNOSIS — D6869 Other thrombophilia: Secondary | ICD-10-CM | POA: Diagnosis not present

## 2022-10-26 DIAGNOSIS — E669 Obesity, unspecified: Secondary | ICD-10-CM | POA: Diagnosis not present

## 2022-10-26 DIAGNOSIS — N529 Male erectile dysfunction, unspecified: Secondary | ICD-10-CM | POA: Diagnosis not present

## 2022-10-26 DIAGNOSIS — M1990 Unspecified osteoarthritis, unspecified site: Secondary | ICD-10-CM | POA: Diagnosis not present

## 2022-10-26 DIAGNOSIS — I129 Hypertensive chronic kidney disease with stage 1 through stage 4 chronic kidney disease, or unspecified chronic kidney disease: Secondary | ICD-10-CM | POA: Diagnosis not present

## 2022-10-26 DIAGNOSIS — H269 Unspecified cataract: Secondary | ICD-10-CM | POA: Diagnosis not present

## 2022-10-26 DIAGNOSIS — K219 Gastro-esophageal reflux disease without esophagitis: Secondary | ICD-10-CM | POA: Diagnosis not present

## 2022-10-26 DIAGNOSIS — I739 Peripheral vascular disease, unspecified: Secondary | ICD-10-CM | POA: Diagnosis not present

## 2022-10-31 ENCOUNTER — Encounter: Payer: Self-pay | Admitting: Nurse Practitioner

## 2022-11-02 DIAGNOSIS — D235 Other benign neoplasm of skin of trunk: Secondary | ICD-10-CM | POA: Diagnosis not present

## 2022-11-02 DIAGNOSIS — D225 Melanocytic nevi of trunk: Secondary | ICD-10-CM | POA: Diagnosis not present

## 2022-11-03 ENCOUNTER — Encounter: Payer: Self-pay | Admitting: Nurse Practitioner

## 2022-11-03 ENCOUNTER — Ambulatory Visit (INDEPENDENT_AMBULATORY_CARE_PROVIDER_SITE_OTHER): Payer: Medicare HMO | Admitting: Nurse Practitioner

## 2022-11-03 VITALS — BP 121/82 | HR 81 | Temp 97.8°F | Wt 265.8 lb

## 2022-11-03 DIAGNOSIS — R1011 Right upper quadrant pain: Secondary | ICD-10-CM

## 2022-11-03 LAB — URINALYSIS, ROUTINE W REFLEX MICROSCOPIC
Bilirubin, UA: NEGATIVE
Glucose, UA: NEGATIVE
Ketones, UA: NEGATIVE
Leukocytes,UA: NEGATIVE
Nitrite, UA: NEGATIVE
Specific Gravity, UA: >1.03 — ABNORMAL HIGH (ref 1.005–1.030)
Urobilinogen, Ur: 0.2 mg/dL (ref 0.2–1.0)
pH, UA: 5.5 (ref 5.0–7.5)

## 2022-11-03 LAB — MICROSCOPIC EXAMINATION: Bacteria, UA: NONE SEEN

## 2022-11-03 NOTE — Progress Notes (Signed)
Hi Thomas Mercado.  Your urine did have some blood in it still.  It would be a good idea to see the Urologist as well to make sure we aren't missing a kidney stone.

## 2022-11-03 NOTE — Progress Notes (Signed)
BP 121/82   Pulse 81   Temp 97.8 F (36.6 C) (Oral)   Wt 265 lb 12.8 oz (120.6 kg)   SpO2 97%   BMI 33.22 kg/m    Subjective:    Patient ID: Thomas Mercado, male    DOB: 03/28/1956, 67 y.o.   MRN: [id number]  HPI: Thomas Mercado is a 67 y.o. male  Chief Complaint  Patient presents with   Flank Pain    Pt states pain started over the weekend has been taking OTC pain relief    ABDOMINAL ISSUES Duration: days Ashby Dawes: sharp Location: RUQ  Severity: 9/10  Radiation: no Episode duration: Frequency: constant Alleviating factors: resting Aggravating factors: movement, walking  Treatments attempted:  tylenol/ibuprofen Constipation: no Diarrhea: no Episodes of diarrhea/day: Mucous in the stool: no Heartburn: no Bloating:yes Flatulence: no Nausea: no Vomiting: no Episodes of vomit/day: Melena or hematochezia: no Rash: no Jaundice: no Fever: no Weight loss: no Small drop of blood in his urine, no pain. Then the pain started a couple days prior.    Relevant past medical, surgical, family and social history reviewed and updated as indicated. Interim medical history since our last visit reviewed. Allergies and medications reviewed and updated.  Review of Systems  Gastrointestinal:  Positive for abdominal pain. Negative for blood in stool, constipation, diarrhea, nausea and vomiting.  Genitourinary:  Positive for hematuria.    Per HPI unless specifically indicated above     Objective:    BP 121/82   Pulse 81   Temp 97.8 F (36.6 C) (Oral)   Wt 265 lb 12.8 oz (120.6 kg)   SpO2 97%   BMI 33.22 kg/m   Wt Readings from Last 3 Encounters:  11/03/22 265 lb 12.8 oz (120.6 kg)  08/30/22 264 lb (119.7 kg)  08/10/22 264 lb (119.7 kg)    Physical Exam Vitals and nursing note reviewed.  Constitutional:      General: He is not in acute distress.    Appearance: Normal appearance. He is not ill-appearing, toxic-appearing or diaphoretic.  HENT:     Head: Normocephalic.      Right Ear: External ear normal.     Left Ear: External ear normal.     Nose: Nose normal. No congestion or rhinorrhea.     Mouth/Throat:     Mouth: Mucous membranes are moist.  Eyes:     General:        Right eye: No discharge.        Left eye: No discharge.     Extraocular Movements: Extraocular movements intact.     Conjunctiva/sclera: Conjunctivae normal.     Pupils: Pupils are equal, round, and reactive to light.  Cardiovascular:     Rate and Rhythm: Normal rate and regular rhythm.     Heart sounds: No murmur heard. Pulmonary:     Effort: Pulmonary effort is normal. No respiratory distress.     Breath sounds: Normal breath sounds. No wheezing, rhonchi or rales.  Abdominal:     General: Abdomen is flat. Bowel sounds are normal.     Tenderness: There is abdominal tenderness. There is no right CVA tenderness, left CVA tenderness, guarding or rebound. Negative signs include Murphy's sign, Rovsing's sign, McBurney's sign, psoas sign and obturator sign.     Hernia: A hernia is present. Hernia is present in the ventral area.    Musculoskeletal:     Cervical back: Normal range of motion and neck supple.  Skin:    General: Skin is  warm and dry.     Capillary Refill: Capillary refill takes less than 2 seconds.  Neurological:     General: No focal deficit present.     Mental Status: He is alert and oriented to person, place, and time.  Psychiatric:        Mood and Affect: Mood normal.        Behavior: Behavior normal.        Thought Content: Thought content normal.        Judgment: Judgment normal.     Results for orders placed or performed during the hospital encounter of 08/30/22  Urinalysis, Complete w Microscopic -  Result Value Ref Range   Color, Urine AMBER (A) YELLOW   APPearance CLEAR CLEAR   Specific Gravity, Urine 1.010 1.005 - 1.030   pH 6.5 5.0 - 8.0   Glucose, UA NEGATIVE NEGATIVE mg/dL   Hgb urine dipstick LARGE (A) NEGATIVE   Bilirubin Urine NEGATIVE  NEGATIVE   Ketones, ur NEGATIVE NEGATIVE mg/dL   Protein, ur NEGATIVE NEGATIVE mg/dL   Nitrite NEGATIVE NEGATIVE   Leukocytes,Ua NEGATIVE NEGATIVE   Squamous Epithelial / HPF NONE SEEN 0 - 5 /HPF   WBC, UA NONE SEEN 0 - 5 WBC/hpf   RBC / HPF >50 0 - 5 RBC/hpf   Bacteria, UA RARE (A) NONE SEEN      Assessment & Plan:   Problem List Items Addressed This Visit   None Visit Diagnoses     Right upper quadrant abdominal pain    -  Primary   Lower than Gallbladder/Liver. Suspect it is related to musculoskeletal. Improved with rest and OTC pain management. Labs ordered at visit today. If not improved by next week will order RUQ [place].  Gallbladder polyp found on imaging in March 2024.   Relevant Orders   Comp Met (CMET)   CBC w/Diff   Urinalysis, Routine w reflex microscopic        Follow up plan: Return if symptoms worsen or fail to improve.

## 2022-11-04 LAB — COMPREHENSIVE METABOLIC PANEL
ALT: 40 IU/L (ref 0–44)
AST: 26 IU/L (ref 0–40)
Albumin/Globulin Ratio: 2.2 (ref 1.2–2.2)
Albumin: 4.6 g/dL (ref 3.9–4.9)
Alkaline Phosphatase: 94 IU/L (ref 44–121)
BUN/Creatinine Ratio: 22 (ref 10–24)
BUN: 21 mg/dL (ref 8–27)
Bilirubin Total: 0.3 mg/dL (ref 0.0–1.2)
CO2: 22 mmol/L (ref 20–29)
Calcium: 9.6 mg/dL (ref 8.6–10.2)
Chloride: 105 mmol/L (ref 96–106)
Creatinine, Ser: 0.97 mg/dL (ref 0.76–1.27)
Globulin, Total: 2.1 g/dL (ref 1.5–4.5)
Glucose: 103 mg/dL — ABNORMAL HIGH (ref 70–99)
Potassium: 4.4 mmol/L (ref 3.5–5.2)
Sodium: 142 mmol/L (ref 134–144)
Total Protein: 6.7 g/dL (ref 6.0–8.5)
eGFR: 85 mL/min/{1.73_m2} (ref >59)

## 2022-11-04 LAB — CBC WITH DIFFERENTIAL/PLATELET
Basophils Absolute: 0.1 10*3/uL (ref 0.0–0.2)
Basos: 1 %
EOS (ABSOLUTE): 0.2 10*3/uL (ref 0.0–0.4)
Eos: 4 %
Hematocrit: 44.8 % (ref 37.5–51.0)
Hemoglobin: 14.4 g/dL (ref 13.0–17.7)
Immature Grans (Abs): 0 10*3/uL (ref 0.0–0.1)
Immature Granulocytes: 0 %
Lymphocytes Absolute: 1.5 10*3/uL (ref 0.7–3.1)
Lymphs: 33 %
MCH: 29.1 pg (ref 26.6–33.0)
MCHC: 32.1 g/dL (ref 31.5–35.7)
MCV: 91 fL (ref 79–97)
Monocytes Absolute: 0.4 10*3/uL (ref 0.1–0.9)
Monocytes: 9 %
Neutrophils Absolute: 2.4 10*3/uL (ref 1.4–7.0)
Neutrophils: 53 %
Platelets: 181 10*3/uL (ref 150–450)
RBC: 4.94 x10E6/uL (ref 4.14–5.80)
RDW: 13 % (ref 11.6–15.4)
WBC: 4.6 10*3/uL (ref 3.4–10.8)

## 2022-11-04 NOTE — Progress Notes (Signed)
Hi Mr. Bassil.  Your lab work looks good.  No concern at this time.

## 2022-12-03 ENCOUNTER — Encounter: Payer: Self-pay | Admitting: Nurse Practitioner

## 2022-12-05 MED ORDER — CETIRIZINE HCL 10 MG PO TABS: 10.0000 mg | ORAL_TABLET | Freq: Every day | ORAL | 2 refills | Status: DC

## 2022-12-05 MED ORDER — BENAZEPRIL HCL 5 MG PO TABS: 5.0000 mg | ORAL_TABLET | Freq: Every day | ORAL | 1 refills | Status: DC

## 2022-12-06 MED ORDER — BENAZEPRIL HCL 5 MG PO TABS: 5.0000 mg | ORAL_TABLET | Freq: Two times a day (BID) | ORAL | 1 refills | Status: DC

## 2022-12-28 DIAGNOSIS — I480 Paroxysmal atrial fibrillation: Secondary | ICD-10-CM | POA: Diagnosis not present

## 2022-12-28 DIAGNOSIS — I700 Atherosclerosis of aorta: Secondary | ICD-10-CM | POA: Diagnosis not present

## 2022-12-28 DIAGNOSIS — G4733 Obstructive sleep apnea (adult) (pediatric): Secondary | ICD-10-CM | POA: Diagnosis not present

## 2022-12-28 DIAGNOSIS — E7801 Familial hypercholesterolemia: Secondary | ICD-10-CM | POA: Diagnosis not present

## 2022-12-28 DIAGNOSIS — I10 Essential (primary) hypertension: Secondary | ICD-10-CM | POA: Diagnosis not present

## 2023-01-11 ENCOUNTER — Encounter: Payer: Medicare HMO | Admitting: Physician Assistant

## 2023-01-12 ENCOUNTER — Ambulatory Visit: Payer: Medicare HMO | Admitting: Physician Assistant

## 2023-01-12 ENCOUNTER — Encounter: Payer: Self-pay | Admitting: Physician Assistant

## 2023-01-12 VITALS — BP 115/77 | HR 73 | Ht 74.0 in | Wt 262.2 lb

## 2023-01-12 DIAGNOSIS — Z0000 Encounter for general adult medical examination without abnormal findings: Secondary | ICD-10-CM | POA: Diagnosis not present

## 2023-01-12 DIAGNOSIS — K219 Gastro-esophageal reflux disease without esophagitis: Secondary | ICD-10-CM | POA: Diagnosis not present

## 2023-01-12 DIAGNOSIS — E785 Hyperlipidemia, unspecified: Secondary | ICD-10-CM

## 2023-01-12 DIAGNOSIS — I10 Essential (primary) hypertension: Secondary | ICD-10-CM | POA: Diagnosis not present

## 2023-01-12 DIAGNOSIS — I480 Paroxysmal atrial fibrillation: Secondary | ICD-10-CM | POA: Diagnosis not present

## 2023-01-12 DIAGNOSIS — T781XXA Other adverse food reactions, not elsewhere classified, initial encounter: Secondary | ICD-10-CM | POA: Diagnosis not present

## 2023-01-12 LAB — URINALYSIS, ROUTINE W REFLEX MICROSCOPIC
Bilirubin, UA: NEGATIVE
Glucose, UA: NEGATIVE
Ketones, UA: NEGATIVE
Nitrite, UA: NEGATIVE
Protein,UA: NEGATIVE
RBC, UA: NEGATIVE
Specific Gravity, UA: >1.03 — ABNORMAL HIGH (ref 1.005–1.030)
Urobilinogen, Ur: 0.2 mg/dL (ref 0.2–1.0)
pH, UA: 6 (ref 5.0–7.5)

## 2023-01-12 LAB — MICROSCOPIC EXAMINATION
Bacteria, UA: NONE SEEN
RBC, Urine: NONE SEEN /hpf (ref 0–2)

## 2023-01-12 MED ORDER — OMEPRAZOLE 40 MG PO CPDR
40.0000 mg | DELAYED_RELEASE_CAPSULE | Freq: Every day | ORAL | 2 refills | Status: DC
Start: 2023-01-12 — End: 2023-07-17

## 2023-01-12 MED ORDER — ROSUVASTATIN CALCIUM 10 MG PO TABS
10.0000 mg | ORAL_TABLET | Freq: Every day | ORAL | 1 refills | Status: DC
Start: 2023-01-12 — End: 2023-07-17

## 2023-01-12 NOTE — Assessment & Plan Note (Signed)
Chronic, historic condition Appears well managed with current regimen comprised of Rosuvastatin 10 mg PO every day  Continue current regimen Recheck lipid panel today- results to dictate further management Follow up in 6 months or sooner if concerns arise

## 2023-01-12 NOTE — Progress Notes (Signed)
Annual Physical Exam   Name: Thomas Mercado   MRN: [id number]    DOB: Nov 23, 1955   Date:01/12/2023  Today's Provider: Jacquelin Hawking, MHS, PA-C Introduced myself to the patient as a PA-C and provided education on APPs in clinical practice.         Subjective  Chief Complaint  Chief Complaint  Patient presents with   Annual Exam    HPI  Patient presents for annual CPE .   Diet: Overall normal dietary intake  Exercise: He is active throughout the day but does not engage in regular exercise  Sleep: "most of the time I can go to sleep, but I don't stay asleep all night long"  Trying to get about 7-8 but actually sleeping about 4-5 hours. He has a CPAP for sleep apnea. He denies issues with CPAP machine  Mood:"good"    IPSS Questionnaire (AUA-7): Over the past month.   1)  How often have you had a sensation of not emptying your bladder completely after you finish urinating?  0 - Not at all  2)  How often have you had to urinate again less than two hours after you finished urinating? 2 - Less than half the time  3)  How often have you found you stopped and started again several times when you urinated?  2 - Less than half the time  4) How difficult have you found it to postpone urination?  1 - Less than 1 time in 5  5) How often have you had a weak urinary stream?  1 - Less than 1 time in 5  6) How often have you had to push or strain to begin urination?  0 - Not at all  7) How many times did you most typically get up to urinate from the time you went to bed until the time you got up in the morning?  1 - 1 time  Total score:  0-7 mildly symptomatic   8-19 moderately symptomatic   20-35 severely symptomatic       Depression: phq 9 is negative    01/12/2023    8:31 AM 01/12/2023    8:27 AM 11/03/2022    8:11 AM 08/02/2022    1:16 PM 07/13/2022    8:48 AM  Depression screen PHQ 2/9  Decreased Interest 0 0 0 0 0  Down, Depressed, Hopeless 0 0 0 0 0  PHQ - 2 Score 0 0 0 0 0   Altered sleeping 2 0 0 1 2  Tired, decreased energy 1 0 0 0 1  Change in appetite 0 0 0 0 0  Feeling bad or failure about yourself  0 0 0 0 0  Trouble concentrating 0 0 0 1 2  Moving slowly or fidgety/restless 0 0 0 0 0  Suicidal thoughts 0 0 0 0 0  PHQ-9 Score 3 0 0 2 5  Difficult doing work/chores Not difficult at all Not difficult at all  Not difficult at all Not difficult at all    Hypertension:  BP Readings from Last 3 Encounters:  01/12/23 115/77  11/03/22 121/82  08/30/22 (!) 142/78    Obesity: Wt Readings from Last 3 Encounters:  01/12/23 262 lb 3.2 oz (118.9 kg)  11/03/22 265 lb 12.8 oz (120.6 kg)  08/30/22 264 lb (119.7 kg)   BMI Readings from Last 3 Encounters:  01/12/23 33.66 kg/m  11/03/22 33.22 kg/m  08/30/22 33.00 kg/m     Lipids:  Lab Results  Component Value Date   CHOL 121 07/13/2022   CHOL 175 01/10/2022   CHOL 145 07/12/2021   Lab Results  Component Value Date   HDL 40 07/13/2022   HDL 38 (L) 01/10/2022   HDL 44 07/12/2021   Lab Results  Component Value Date   LDLCALC 62 07/13/2022   LDLCALC 115 (H) 01/10/2022   LDLCALC 82 07/12/2021   Lab Results  Component Value Date   TRIG 102 07/13/2022   TRIG 121 01/10/2022   TRIG 104 07/12/2021   Lab Results  Component Value Date   CHOLHDL 3.0 07/13/2022   CHOLHDL 4.6 01/10/2022   CHOLHDL 3.3 07/12/2021   No results found for: "LDLDIRECT" Glucose:  Glucose  Date Value Ref Range Status  11/03/2022 103 (H) 70 - 99 mg/dL Final  18/84/1660 86 70 - 99 mg/dL Final  63/06/6008 99 70 - 99 mg/dL Final  93/23/5573 220 (H) 65 - 99 mg/dL Final  25/42/7062 376 (H) 65 - 99 mg/dL Final   Glucose, Bld  Date Value Ref Range Status  05/17/2021 76 70 - 99 mg/dL Final    Comment:    Glucose reference range applies only to samples taken after fasting for at least 8 hours.  12/17/2015 117 (H) 65 - 99 mg/dL Final  28/31/5176 89 65 - 99 mg/dL Final   Glucose-Capillary  Date Value Ref Range Status   08/24/2017 99 65 - 99 mg/dL Final    Flowsheet Row Office Visit from 11/03/2022 in [facility]  AUDIT-C Score 2        Married STD testing and prevention (HIV/chl/gon/syphilis):  no- declines today  HIV screening: NA- declines today  Hep C Screening: completed  Skin cancer: Discussed monitoring for atypical lesions Colorectal cancer screening: Completed and UTD  Prostate cancer screening:  yes- ordered today  Lab Results  Component Value Date   PSA from [facility] 07/16/2014     Lung cancer:  Low Dose CT Chest recommended if Age 26-80 years, 30 pack-year currently smoking OR have quit w/in 15years. Patient  not applicable AAA: The USPSTF recommends one-time screening with ultrasonography in men ages 71 to 75 years who have ever smoked. Patient:  not applicable ECG:  NA  Vaccines:   HPV: aged out  Tdap: UTD Shingrix: UTD Pneumonia: UTD Flu: postponed until fall  COVID-19: Discussed vaccine and booster recommendations per available CDC guidelines    Patient Active Problem List   Diagnosis Date Noted   Acute cystitis with hematuria 08/02/2022   Allergic reaction to alpha-gal 02/15/2021   OSA on CPAP 01/05/2021   CPAP use counseling 01/05/2021   Obesity (BMI 30-39.9) 01/05/2021   Paroxysmal atrial fibrillation (HCC) 01/05/2021   Trigger finger of right hand 11/28/2017   Aortic atherosclerosis (HCC) 07/31/2017   Solitary pulmonary nodule 07/31/2017   Sprain of knee 06/05/2017   Essential hypertension 03/28/2017   Infection of parotid gland 01/26/2016   Knee pain, left 04/06/2015   ED (erectile dysfunction) 03/10/2015   Allergic rhinitis 03/10/2015   Hypogonadism in male 03/10/2015   Non-alcoholic fatty liver disease 03/10/2015   Obstructive apnea 03/12/2013   Acid reflux 02/07/2013   Hyperlipidemia 02/07/2013    Past Surgical History:  Procedure Laterality Date   ABLATION  02/2013   heart   ABLATION  12/2015   heart   BASAL CELL  CARCINOMA EXCISION Left 06/29/2022   left foot   COLONOSCOPY WITH PROPOFOL N/A 10/23/2015   Procedure: COLONOSCOPY WITH PROPOFOL;  Surgeon: Christena Deem, MD;  Location: [facility] ENDOSCOPY;  Service: Endoscopy;  Laterality: N/A;   KNEE ARTHROSCOPY Left 04/09/2015   Procedure: ,left knee arthroscopy, limited synovectomy and partial medial menisectomy.;  Surgeon: Juanell Fairly, MD;  Location: [facility] ORS;  Service: Orthopedics;  Laterality: Left;   SHOULDER ARTHROSCOPY WITH OPEN ROTATOR CUFF REPAIR AND DISTAL CLAVICLE ACROMINECTOMY Left 05/20/2021   Procedure: SHOULDER ARTHROSCOPY WITH OPEN ROTATOR CUFF REPAIR AND DISTAL CLAVICLE ACROMINECTOMY;  Surgeon: Juanell Fairly, MD;  Location: [facility] ORS;  Service: Orthopedics;  Laterality: Left;   VARICOCELE EXCISION      Family History  Problem Relation Age of Onset   Breast cancer Mother    Alzheimer's disease Father    Hypertension Father    Arthritis Father    Heart disease Brother    Emphysema Brother     Social History   Socioeconomic History   Marital status: Married    Spouse name: Not on file   Number of children: Not on file   Years of education: Not on file   Highest education level: Associate degree: occupational, Scientist, product/process development, or vocational program  Occupational History   Occupation: full time    Employer: [employer]  Tobacco Use   Smoking status: Never   Smokeless tobacco: Never  Vaping Use   Vaping status: Never Used  Substance and Sexual Activity   Alcohol use: Yes    Alcohol/week: 0.0 standard drinks of alcohol    Comment: on occasion with beer or liquor   Drug use: No   Sexual activity: Yes  Other Topics Concern   Not on file  Social History Narrative   Not on file   Social Determinants of Health   Financial Resource Strain: Low Risk  (11/01/2022)   Overall Financial Resource Strain (CARDIA)    Difficulty of Paying Living Expenses: Not hard at all  Food Insecurity: No Food Insecurity (11/01/2022)    Hunger Vital Sign    Worried About Running Out of Food in the Last Year: Never true    Ran Out of Food in the Last Year: Never true  Transportation Needs: No Transportation Needs (11/01/2022)   PRAPARE - Administrator, Civil Service (Medical): No    Lack of Transportation (Non-Medical): No  Physical Activity: Insufficiently Active (11/01/2022)   Exercise Vital Sign    Days of Exercise per Week: 1 day    Minutes of Exercise per Session: 130 min  Stress: No Stress Concern Present (11/01/2022)   [employer] of Occupational Health - Occupational Stress Questionnaire    Feeling of Stress : Only a little  Social Connections: Unknown (11/01/2022)   Social Connection and Isolation Panel [NHANES]    Frequency of Communication with Friends and Family: More than three times a week    Frequency of Social Gatherings with Friends and Family: Twice a week    Attends Religious Services: Patient declined    Database administrator or Organizations: Yes    Attends Engineer, structural: More than 4 times per year    Marital Status: Married  Catering manager Violence: Not At Risk (04/05/2022)   Humiliation, Afraid, Rape, and Kick questionnaire    Fear of Current or Ex-Partner: No    Emotionally Abused: No    Physically Abused: No    Sexually Abused: No     Current Outpatient Medications:    aspirin EC 81 MG tablet, Take 81 mg by mouth daily., Disp: , Rfl:    benazepril (  LOTENSIN) 5 MG tablet, Take 1 tablet (5 mg total) by mouth 2 (two) times daily., Disp: 180 tablet, Rfl: 1   cetirizine (ZYRTEC) 10 MG tablet, Take 1 tablet (10 mg total) by mouth daily., Disp: 90 tablet, Rfl: 2   fluticasone (FLONASE) 50 MCG/ACT nasal spray, Place 2 sprays into both nostrils daily. (Patient taking differently: Place 1 spray into both nostrils daily as needed for allergies.), Disp: 16 g, Rfl: 12   omeprazole (PRILOSEC) 40 MG capsule, Take 1 capsule (40 mg total) by mouth daily., Disp: 90 capsule,  Rfl: 2   rosuvastatin (CRESTOR) 10 MG tablet, Take 1 tablet (10 mg total) by mouth daily., Disp: 90 tablet, Rfl: 1  Allergies  Allergen Reactions   Alpha-Gal Diarrhea    Aches and pains  Other Reaction(s): Not available   Penicillin G Benzathine Rash    As a child     Review of Systems  Constitutional:  Negative for chills, fever, malaise/fatigue and weight loss.  HENT:  Positive for tinnitus. Negative for hearing loss, nosebleeds and sore throat.   Eyes:  Negative for blurred vision, double vision and photophobia.  Respiratory:  Negative for shortness of breath and wheezing.   Cardiovascular:  Positive for chest pain (occurred in June - similar to alpha gal reaction) was seen by Cardiology 2 weeks ago). Negative for palpitations and leg swelling.  Gastrointestinal:  Positive for heartburn. Negative for blood in stool, constipation, diarrhea, nausea and vomiting.  Genitourinary:  Positive for hematuria (was evaluated by PCP).  Musculoskeletal:  Negative for falls, joint pain and myalgias.  Skin:  Negative for itching and rash.  Neurological:  Positive for tingling (in elbows if they are crossed or bent a certain way, ongoing for several weeks). Negative for dizziness, tremors, loss of consciousness, weakness and headaches.  Psychiatric/Behavioral:  Negative for depression. The patient is not nervous/anxious.        Objective  Vitals:   01/12/23 0828  BP: 115/77  Pulse: 73  SpO2: 98%  Weight: 262 lb 3.2 oz (118.9 kg)  Height: 6\' 2"  (1.88 m)    Body mass index is 33.66 kg/m.  Physical Exam Vitals reviewed.  Constitutional:      General: He is awake.     Appearance: Normal appearance. He is well-developed and well-groomed.  HENT:     Head: Normocephalic and atraumatic.     Right Ear: Hearing, tympanic membrane and ear canal normal.     Left Ear: Hearing, tympanic membrane and ear canal normal.     Mouth/Throat:     Lips: Pink.     Mouth: Mucous membranes are  moist. No injury.     Pharynx: Oropharynx is clear. Uvula midline. No oropharyngeal exudate, posterior oropharyngeal erythema or postnasal drip.     Tonsils: No tonsillar exudate or tonsillar abscesses.  Eyes:     General: Lids are normal. Gaze aligned appropriately.     Extraocular Movements: Extraocular movements intact.     Right eye: Normal extraocular motion and no nystagmus.     Left eye: Normal extraocular motion and no nystagmus.     Conjunctiva/sclera: Conjunctivae normal.     Pupils: Pupils are equal, round, and reactive to light.  Neck:     Thyroid: No thyroid mass, thyromegaly or thyroid tenderness.  Cardiovascular:     Rate and Rhythm: Normal rate and regular rhythm.     Pulses: Normal pulses.          Radial pulses are 2+ on the  right side and 2+ on the left side.     Heart sounds: Normal heart sounds. No murmur heard.    No friction rub. No gallop.  Pulmonary:     Effort: Pulmonary effort is normal.     Breath sounds: Normal breath sounds. No decreased air movement. No decreased breath sounds, rhonchi or rales.  Abdominal:     General: Abdomen is flat. Bowel sounds are normal.     Palpations: Abdomen is soft.     Tenderness: There is no abdominal tenderness. There is no right CVA tenderness or left CVA tenderness. Negative signs include Murphy's sign.  Musculoskeletal:     Cervical back: Normal range of motion and neck supple. No rigidity. Normal range of motion.     Right lower leg: No edema.     Left lower leg: No edema.  Lymphadenopathy:     Head:     Right side of head: No submental, submandibular or preauricular adenopathy.     Left side of head: No submental, submandibular or preauricular adenopathy.     Cervical:     Right cervical: No superficial or posterior cervical adenopathy.    Left cervical: No superficial or posterior cervical adenopathy.     Upper Body:     Right upper body: No supraclavicular adenopathy.     Left upper body: No supraclavicular  adenopathy.  Neurological:     General: No focal deficit present.     Mental Status: He is alert and oriented to person, place, and time. Mental status is at baseline.     GCS: GCS eye subscore is 4. GCS verbal subscore is 5. GCS motor subscore is 6.     Cranial Nerves: No cranial nerve deficit, dysarthria or facial asymmetry.     Motor: No weakness, tremor, atrophy or abnormal muscle tone.     Gait: Gait is intact.  Psychiatric:        Attention and Perception: Attention and perception normal.        Mood and Affect: Mood and affect normal.        Speech: Speech normal.        Behavior: Behavior normal. Behavior is cooperative.        Thought Content: Thought content normal.        Cognition and Memory: Cognition normal.      Recent Results (from the past 2160 hour(s))  Urinalysis, Routine w reflex microscopic     Status: Abnormal   Collection Time: 11/03/22  8:27 AM  Result Value Ref Range   Specific Gravity, UA >1.030 (H) 1.005 - 1.030   pH, UA 5.5 5.0 - 7.5   Color, UA Yellow Yellow   Appearance Ur Clear Clear   Leukocytes,UA Negative Negative   Protein,UA 1+ (A) Negative/Trace   Glucose, UA Negative Negative   Ketones, UA Negative Negative   RBC, UA 1+ (A) Negative   Bilirubin, UA Negative Negative   Urobilinogen, Ur 0.2 0.2 - 1.0 mg/dL   Nitrite, UA Negative Negative   Microscopic Examination See below:   Microscopic Examination     Status: Abnormal   Collection Time: 11/03/22  8:27 AM   Urine  Result Value Ref Range   WBC, UA 0-5 0 - 5 /hpf   RBC, Urine 3-10 (A) 0 - 2 /hpf   Epithelial Cells (non renal) 0-10 0 - 10 /hpf   Mucus, UA Present (A) Not Estab.   Bacteria, UA None seen None seen/Few  Comp Met (CMET)  Status: Abnormal   Collection Time: 11/03/22  8:34 AM  Result Value Ref Range   Glucose 103 (H) 70 - 99 mg/dL   BUN 21 8 - 27 mg/dL   Creatinine, Ser 4.09 0.76 - 1.27 mg/dL   eGFR 85 >81 XB/JYN/8.29   BUN/Creatinine Ratio 22 10 - 24   Sodium 142  134 - 144 mmol/L   Potassium 4.4 3.5 - 5.2 mmol/L   Chloride 105 96 - 106 mmol/L   CO2 22 20 - 29 mmol/L   Calcium 9.6 8.6 - 10.2 mg/dL   Total Protein 6.7 6.0 - 8.5 g/dL   Albumin 4.6 3.9 - 4.9 g/dL   Globulin, Total 2.1 1.5 - 4.5 g/dL   Albumin/Globulin Ratio 2.2 1.2 - 2.2   Bilirubin Total 0.3 0.0 - 1.2 mg/dL   Alkaline Phosphatase 94 44 - 121 IU/L   AST 26 0 - 40 IU/L   ALT 40 0 - 44 IU/L  CBC w/Diff     Status: None   Collection Time: 11/03/22  8:34 AM  Result Value Ref Range   WBC 4.6 3.4 - 10.8 x10E3/uL   RBC 4.94 4.14 - 5.80 x10E6/uL   Hemoglobin 14.4 13.0 - 17.7 g/dL   Hematocrit 56.2 13.0 - 51.0 %   MCV 91 79 - 97 fL   MCH 29.1 26.6 - 33.0 pg   MCHC 32.1 31.5 - 35.7 g/dL   RDW 86.5 78.4 - 69.6 %   Platelets 181 150 - 450 x10E3/uL   Neutrophils 53 Not Estab. %   Lymphs 33 Not Estab. %   Monocytes 9 Not Estab. %   Eos 4 Not Estab. %   Basos 1 Not Estab. %   Neutrophils Absolute 2.4 1.4 - 7.0 x10E3/uL   Lymphocytes Absolute 1.5 0.7 - 3.1 x10E3/uL   Monocytes Absolute 0.4 0.1 - 0.9 x10E3/uL   EOS (ABSOLUTE) 0.2 0.0 - 0.4 x10E3/uL   Basophils Absolute 0.1 0.0 - 0.2 x10E3/uL   Immature Granulocytes 0 Not Estab. %   Immature Grans (Abs) 0.0 0.0 - 0.1 x10E3/uL     Fall Risk:    01/12/2023    8:31 AM 01/12/2023    8:27 AM 11/03/2022    8:11 AM 08/02/2022    1:16 PM 07/13/2022    8:48 AM  Fall Risk   Falls in the past year? 0 0 0 0 0  Number falls in past yr: 0 0 0 0 0  Injury with Fall? 0 0 0 0 0  Risk for fall due to : No Fall Risks No Fall Risks No Fall Risks No Fall Risks No Fall Risks  Follow up Falls evaluation completed Falls evaluation completed Falls evaluation completed Falls evaluation completed Falls evaluation completed     Functional Status Survey:      Assessment & Plan  Problem List Items Addressed This Visit       Cardiovascular and Mediastinum   Essential hypertension    Chronic, controlled  Appears well managed on current regimen  comprised of Benazepril 5 mg PO BID  Continue current regimen Labs ordered today  Follow up in 6 months or sooner if concerns arise        Relevant Medications   rosuvastatin (CRESTOR) 10 MG tablet   Paroxysmal atrial fibrillation (HCC)    Chronic, historic condition Appears well managed at this time Patient had most recent Cardiology apt about 2 weeks ago  He reports brief episode of chest pain earlier this month after eating  steak but has not had any palpitations or other chest symptoms He is taking Aspirin and statin therapy - no anticoag at this time and he is s/p ablation  Continue collaboration with Cardiology Continue current regimen Follow up in 6 months or sooner if concerns arise        Relevant Medications   rosuvastatin (CRESTOR) 10 MG tablet     Digestive   Acid reflux    Chronic, historic condition Patient reports concerns for recent symptoms and would like to try switching his Protonix to another medication Will send in script for omeprazole 40 mg PO every day  Reviewed diet plan to help with managing symptoms  Follow up in 6 months or sooner if concerns arise        Relevant Medications   omeprazole (PRILOSEC) 40 MG capsule     Other   Hyperlipidemia    Chronic, historic condition Appears well managed with current regimen comprised of Rosuvastatin 10 mg PO every day  Continue current regimen Recheck lipid panel today- results to dictate further management Follow up in 6 months or sooner if concerns arise        Relevant Medications   rosuvastatin (CRESTOR) 10 MG tablet   Other Relevant Orders   Lipid Profile   Allergic reaction to alpha-gal    Chronic, historic condition Reports brief episode of chest pain and upset stomach, diarrhea after eating steak a few weeks ago and would like levels rechecked Labs ordered today per request Continue collaboration with Dr. Eddie Candle  Continue current regimen Results of labs to dictate further  management Follow up in 6 months or sooner if concerns arise        Relevant Orders   Alpha-Gal Panel   Other Visit Diagnoses     Annual physical exam    -  Primary -Prostate cancer screening and PSA options (with potential risks and benefits of testing vs not testing) were discussed along with recent recs/guidelines. -USPSTF grade A and B recommendations reviewed with patient; age-appropriate recommendations, preventive care, screening tests, etc discussed and encouraged; healthy living encouraged; see AVS for patient education given to patient -Discussed importance of 150 minutes of physical activity weekly, eat two servings of fish weekly, eat one serving of tree nuts ( cashews, pistachios, pecans, almonds.Marland Kitchen) every other day, eat 6 servings of fruit/vegetables daily and drink plenty of water and avoid sweet beverages.  -Reviewed Health Maintenance: yes    Relevant Orders   Comp Met (CMET)   CBC w/Diff   Lipid Profile   TSH   HgB A1c   Urinalysis, Routine w reflex microscopic   PSA        Return in about 6 months (around 07/15/2023) for HTN, HLD, a.fib, GERD .   I, Vansh Reckart E Loralyn Rachel, PA-C, have reviewed all documentation for this visit. The documentation on 01/12/23 for the exam, diagnosis, procedures, and orders are all accurate and complete.   Jacquelin Hawking, MHS, PA-C [facility]

## 2023-01-12 NOTE — Assessment & Plan Note (Signed)
Chronic, historic condition Appears well managed at this time Patient had most recent Cardiology apt about 2 weeks ago  He reports brief episode of chest pain earlier this month after eating steak but has not had any palpitations or other chest symptoms He is taking Aspirin and statin therapy - no anticoag at this time and he is s/p ablation  Continue collaboration with Cardiology Continue current regimen Follow up in 6 months or sooner if concerns arise

## 2023-01-12 NOTE — Assessment & Plan Note (Signed)
Chronic, controlled  Appears well managed on current regimen comprised of Benazepril 5 mg PO BID  Continue current regimen Labs ordered today  Follow up in 6 months or sooner if concerns arise

## 2023-01-12 NOTE — Assessment & Plan Note (Signed)
Chronic, historic condition Reports brief episode of chest pain and upset stomach, diarrhea after eating steak a few weeks ago and would like levels rechecked Labs ordered today per request Continue collaboration with Dr. Eddie Candle  Continue current regimen Results of labs to dictate further management Follow up in 6 months or sooner if concerns arise

## 2023-01-12 NOTE — Assessment & Plan Note (Signed)
Chronic, historic condition Patient reports concerns for recent symptoms and would like to try switching his Protonix to another medication Will send in script for omeprazole 40 mg PO every day  Reviewed diet plan to help with managing symptoms  Follow up in 6 months or sooner if concerns arise

## 2023-01-13 LAB — LIPID PANEL: Triglycerides: 94 mg/dL (ref 0–149)

## 2023-01-13 LAB — CBC WITH DIFFERENTIAL/PLATELET
Hemoglobin: 14.5 g/dL (ref 13.0–17.7)
MCH: 29.2 pg (ref 26.6–33.0)

## 2023-01-16 NOTE — Progress Notes (Signed)
Your labs are back Your A1c is at 5.9 which places you in prediabetic range. At this time, medications are not required but I recommend some lifestyle changes to assist with preventing further progression. Please reduce your excess sugar and carbohydrate intake and make sure you are exercising regularly.  Your electrolytes, liver and kidney function are all pretty good at this time. Your Alpha gal panel was improved from previous results Your thyroid and prostate testing were in normal ranges Your blood count was normal Your cholesterol looks great.  Your urine did have some trace white blood cells but no bacteria were seen under the microscope and your CBC was normal which makes me suspect this might be contamination.  At this time please continue with your medication regimen and try to incorporate the recommendations above. Let us know if you have further questions or concerns.

## 2023-01-18 IMAGING — MR MR SHOULDER*L* W/ CM
6 series · 40 of 40 positions shown · IV contrast (agent unspecified)
Comparison: X-ray 06/16/2009

CLINICAL DATA: Left shoulder pain and weakness after injury pushing
a boat 1 year ago

EXAM:
MR ARTHROGRAM OF THE LEFT SHOULDER
TECHNIQUE: Multiplanar, multisequence MR imaging of the left shoulder was
performed following the administration of intra-articular contrast.
CONTRAST:  See Injection Documentation.

[Series 5: T1 fat-sat · axial · left · 4.0mm · 0.55mm/px · z∈[-56,+57]mm · 6 of 24 slices shown (1 of 3)]
[im 1/24]
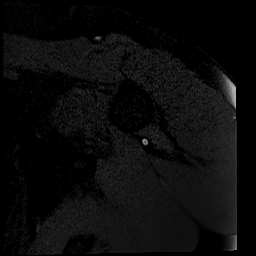
[im 5/24]
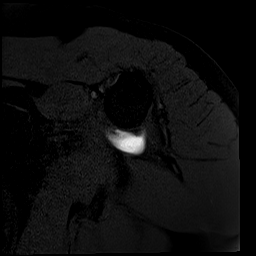
[im 10/24]
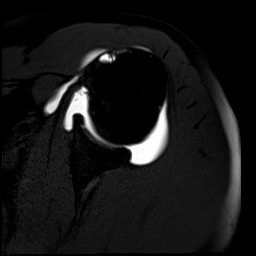
[im 14/24]
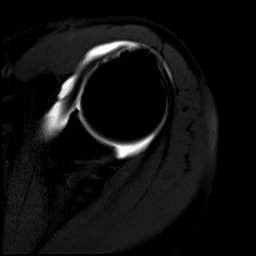
[im 19/24]
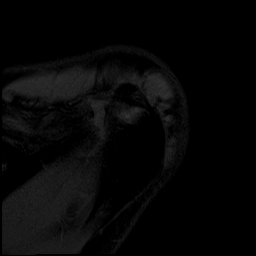
[im 24/24]
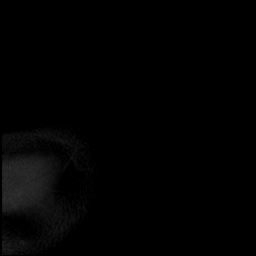

[Series 6: T1 fat-sat · oblique · left · 4.0mm · 0.55mm/px · 6 of 24 slices shown (2 of 3)]
[im 1/24]
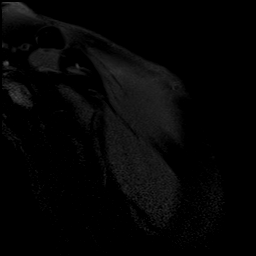
[im 5/24]
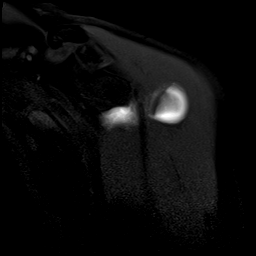
[im 10/24]
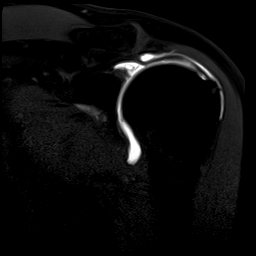
[im 14/24]
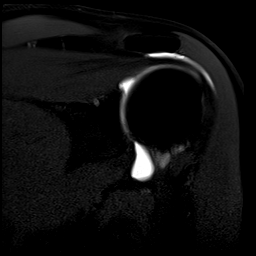
[im 19/24]
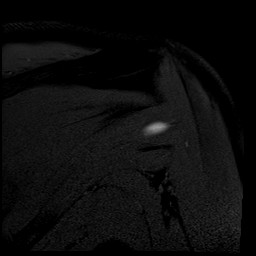
[im 24/24]
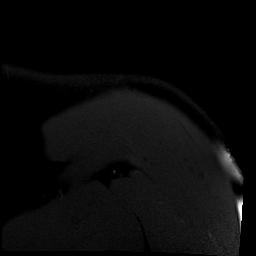

[Series 7: T2 fat-sat · oblique · left · 4.0mm · 0.55mm/px · 7 of 24 slices shown (1 of 2)]
[im 1/24]
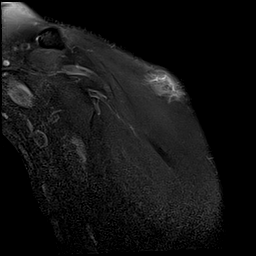
[im 4/24]
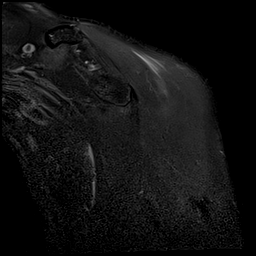
[im 8/24]
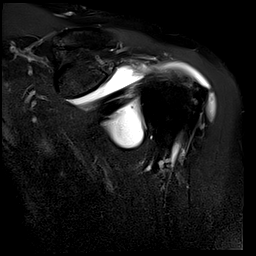
[im 12/24]
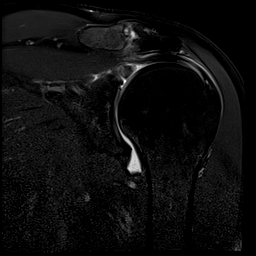
[im 16/24]
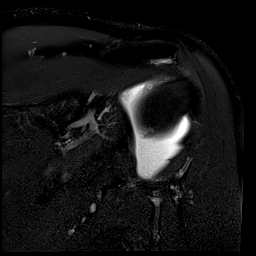
[im 20/24]
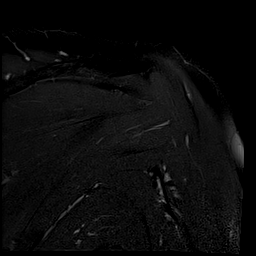
[im 24/24]
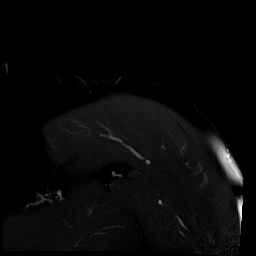

[Series 8: T1 · oblique · left · 4.0mm · 0.51mm/px · 7 of 24 slices shown]
[im 1/24]
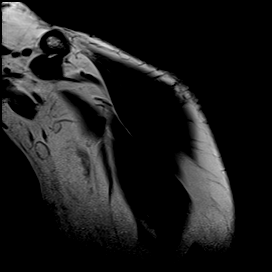
[im 4/24]
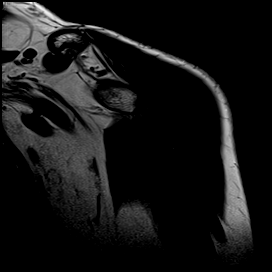
[im 8/24]
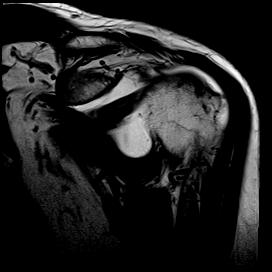
[im 12/24]
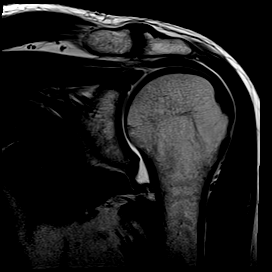
[im 16/24]
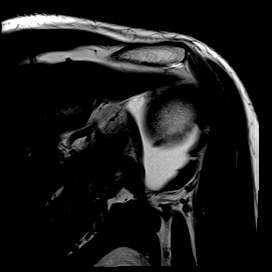
[im 20/24]
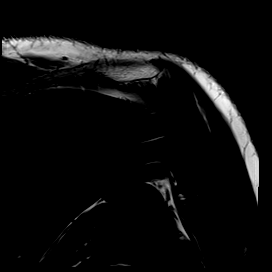
[im 24/24]
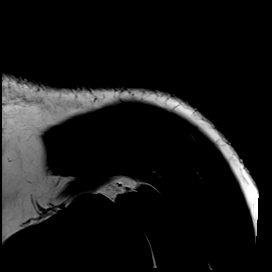

[Series 9: T2 fat-sat · oblique · left · 4.0mm · 0.55mm/px · 7 of 24 slices shown (2 of 2)]
[im 1/24]
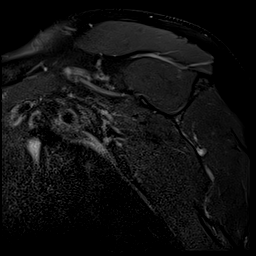
[im 4/24]
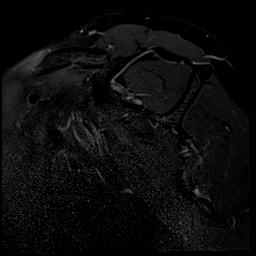
[im 8/24]
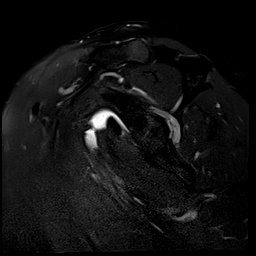
[im 12/24]
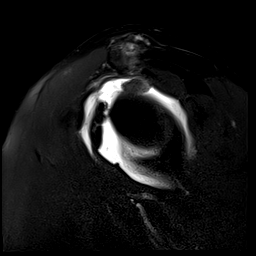
[im 16/24]
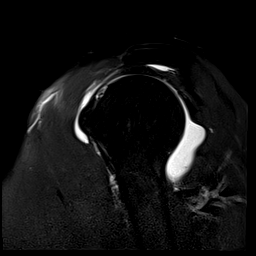
[im 20/24]
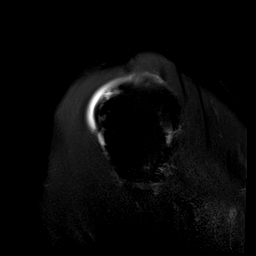
[im 24/24]
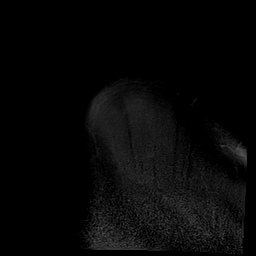

[Series 12: T1 fat-sat · sagittal · left · 4.0mm · 0.62mm/px · 7 of 24 slices shown (3 of 3)]
[im 1/24]
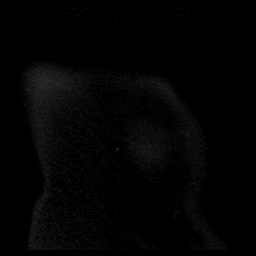
[im 4/24]
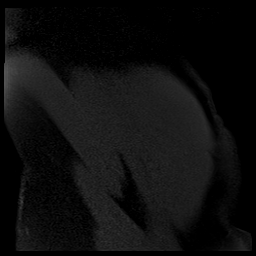
[im 8/24]
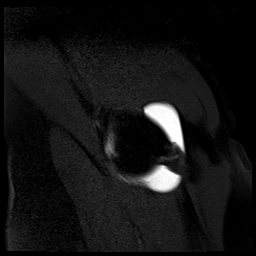
[im 12/24]
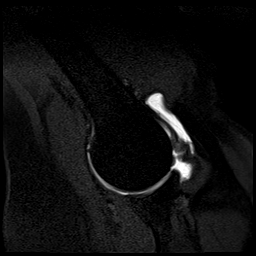
[im 16/24]
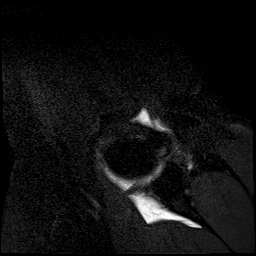
[im 20/24]
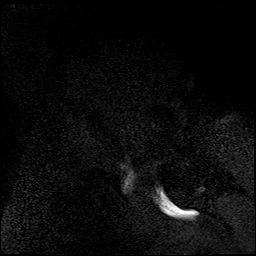
[im 24/24]
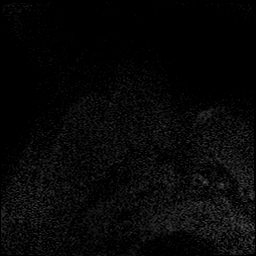

[40 of 40 positions shown; findings below may reference images not displayed]

FINDINGS: Rotator cuff: Full-thickness tear of the anterior supraspinatus
tendon from its distal insertion with up to 7 mm of tendon
retraction. Tear measures approximately 10 mm in AP dimension. The
remaining intact portion of the supraspinatus tendon posteriorly and
the infraspinatus tendon are both attenuated. Subscapularis
tendinosis without focal tear. Intact teres minor.

Muscles: No rotator cuff muscle atrophy or fatty infiltration.

Biceps long head: Nonvisualization of the long head biceps tendon,
likely torn and retracted.

Acromioclavicular Joint: Mild arthropathy of the AC joint. Small
volume contrast within the subacromial-subdeltoid bursal space
communicating with the glenohumeral joint.

Glenohumeral Joint: Well distended with injected contrast. No focal
cartilage defect.

Labrum: Markedly abnormal appearance of the posterosuperior labrum
which is thickened with intermediate signal and appears partially
detached from the superior glenoid (series 7, images 12-14).

Bones: No acute fracture. No dislocation. No suspicious bone lesion.

Other: None.
IMPRESSION: 1. Full-thickness incomplete tear of the anterior supraspinatus
tendon with mild retraction. The remaining intact portion of the
supraspinatus tendon and the infraspinatus tendon are both
attenuated.
2. Subscapularis tendinosis without focal tear.
3. Nonvisualization of the long head biceps tendon, likely torn and
retracted.
4. Markedly abnormal appearance of the posterosuperior labrum which
is partially detached from the superior glenoid.

## 2023-01-23 ENCOUNTER — Encounter: Payer: Self-pay | Admitting: Physician Assistant

## 2023-01-23 NOTE — Telephone Encounter (Signed)
Called and scheduled patient on 01/24/2023 @ 11:00 am.

## 2023-01-24 ENCOUNTER — Telehealth (INDEPENDENT_AMBULATORY_CARE_PROVIDER_SITE_OTHER): Payer: Medicare HMO | Admitting: Physician Assistant

## 2023-01-24 ENCOUNTER — Encounter: Payer: Self-pay | Admitting: Physician Assistant

## 2023-01-24 VITALS — BP 129/87 | HR 90 | Temp 98.1°F

## 2023-01-24 DIAGNOSIS — U071 COVID-19: Secondary | ICD-10-CM | POA: Diagnosis not present

## 2023-01-24 MED ORDER — BENZONATATE 100 MG PO CAPS: 100.0000 mg | ORAL_CAPSULE | Freq: Two times a day (BID) | ORAL | 0 refills | Status: DC | PRN

## 2023-01-24 NOTE — Progress Notes (Signed)
Virtual Visit via Video Note  I connected with Thomas Mercado on 01/24/23 at 11:00 AM EDT by a video enabled telemedicine application and verified that I am speaking with the correct person using two identifiers.    Chief Complaint  Patient presents with   Covid Positive    Patient says his symptoms started around Thursday or Friday. Patient says he did not start to feel bad until Saturday or Sunday. Patient took an at home COVID test on Sunday afternoon and it was positive. Patient says he has tried over the counter medications of Congestion Relief.   Nasal Congestion   Cough    Location: Patient: at home  Provider: [facility], [place], [place]    I discussed the limitations of evaluation and management by telemedicine and the availability of in person appointments. The patient expressed understanding and agreed to proceed.  History of Present Illness:     COVID positive  Reports coughing has been ongoing for about a week  He reports he has had sinus congestion and coughing (cough is dry) He states he took a home COVID test on Sun and it was positive  He denies SOB,  Reports subjective fever and chills on Sun along with body aches   Interventions: OTC congestion relief medications, allergy + headache    Review of Systems  Constitutional:  Positive for chills, fever and malaise/fatigue.  HENT:  Positive for congestion.   Respiratory:  Positive for cough. Negative for shortness of breath and wheezing.   Cardiovascular:  Negative for chest pain.  Gastrointestinal:  Negative for diarrhea, nausea and vomiting.  Musculoskeletal:  Positive for myalgias.  Neurological:  Positive for headaches (mild). Negative for dizziness.    Observations/Objective:  Due to the nature of the virtual visit, physical exam and observations are limited. Able to obtain the following observations:   Alert, oriented, Appears comfortable, in no acute distress.  No scleral  injection, no appreciated hoarseness, tachypnea, wheeze or strider. Able to maintain conversation without visible strain.  No cough appreciated during visit.   Assessment and Plan:  Problem List Items Addressed This Visit   None Visit Diagnoses     COVID-19    -  Primary Acute, new concern Reports testing positive for COVID on Sunday with home test and is dry cough, congestion, runny nose, headaches, body aches  He states cough is worse thing and seems to get worse when he lays down for bed at night He is outside the window for COVID antivirals at this time  Recommend symptomatic management at this time - reviewed appropriate options with him, and will send in script for Tessalon pearls to assist with cough Reviewed ED and return precautions Follow up as needed for persistent or progressing symptoms      Relevant Medications   benzonatate (TESSALON) 100 MG capsule          Follow Up Instructions:    I discussed the assessment and treatment plan with the patient. The patient was provided an opportunity to ask questions and all were answered. The patient agreed with the plan and demonstrated an understanding of the instructions.   The patient was advised to call back or seek an in-person evaluation if the symptoms worsen or if the condition fails to improve as anticipated.  I provided 8 minutes of non-face-to-face time during this encounter.   No follow-ups on file.   I,  E , PA-C, have reviewed all documentation for this visit. The documentation  on 01/24/23 for the exam, diagnosis, procedures, and orders are all accurate and complete.   Jacquelin Hawking, MHS, PA-C [facility]

## 2023-01-24 NOTE — Patient Instructions (Signed)
  The goal of treatment at this time is to reduce your symptoms and discomfort   I have sent in Tessalon pearls for you to take twice per day to help with your cough  You can use over the counter medications such as Dayquil/Nyquil, AlkaSeltzer formulations, etc to provide further relief of symptoms according to the manufacturer's instructions   I also recommend adding an antihistamine to your daily regimen This includes medications like Claritin, Allegra, Zyrtec- the generics of these work very well and are usually less expensive I recommend using Flonase nasal spray - 2 puffs twice per day to help with your nasal congestion The antihistamines and Flonase can take a few weeks to provide significant relief from allergy symptoms but should start to provide some benefit soon.   If your symptoms do not improve or become worse in the next 5-7 days please make an apt at the office so we can see you  Go to the ER if you begin to have more serious symptoms such as shortness of breath, trouble breathing, loss of consciousness, swelling around the eyes, high fever, severe lasting headaches, vision changes or neck pain/stiffness.

## 2023-02-03 ENCOUNTER — Other Ambulatory Visit: Payer: Self-pay | Admitting: Nurse Practitioner

## 2023-02-03 NOTE — Telephone Encounter (Signed)
Requested Prescriptions  Refused Prescriptions Disp Refills   pantoprazole (PROTONIX) 40 MG tablet [Pharmacy Med Name: PANTOPRAZOLE SOD DR 40 MG TAB] 180 tablet 0    Sig: TAKE 1 TABLET BY MOUTH TWICE A DAY IN THE MORNING AND IN THE EVENING     Gastroenterology: Proton Pump Inhibitors Passed - 02/03/2023  6:23 AM      Passed - Valid encounter within last 12 months    Recent Outpatient Visits           1 week ago COVID-19   [facility] Mecum, Oswaldo Conroy, PA-C   3 weeks ago Annual physical exam   [place] [facility] Mecum, Erin E, PA-C   3 months ago Right upper quadrant abdominal pain   [facility] Williams, Clydie Braun, NP   6 months ago Acute cystitis with hematuria   [place] [facility] Larae Grooms, NP   6 months ago Aortic atherosclerosis [facility])   [place] [facility] Larae Grooms, NP       Future Appointments             In 5 months Larae Grooms, NP [facility]

## 2023-03-28 DIAGNOSIS — L578 Other skin changes due to chronic exposure to nonionizing radiation: Secondary | ICD-10-CM | POA: Diagnosis not present

## 2023-03-28 DIAGNOSIS — Z859 Personal history of malignant neoplasm, unspecified: Secondary | ICD-10-CM | POA: Diagnosis not present

## 2023-03-28 DIAGNOSIS — Z872 Personal history of diseases of the skin and subcutaneous tissue: Secondary | ICD-10-CM | POA: Diagnosis not present

## 2023-03-28 DIAGNOSIS — L570 Actinic keratosis: Secondary | ICD-10-CM | POA: Diagnosis not present

## 2023-03-28 DIAGNOSIS — Z86018 Personal history of other benign neoplasm: Secondary | ICD-10-CM | POA: Diagnosis not present

## 2023-03-28 DIAGNOSIS — Z85828 Personal history of other malignant neoplasm of skin: Secondary | ICD-10-CM | POA: Diagnosis not present

## 2023-04-25 NOTE — Progress Notes (Unsigned)
[facility] [address] [place], [place] [address]  Pulmonary Sleep Medicine   Office Visit Note  Patient Name: Thomas Mercado DOB: 08/01/1955 MRN [id number]    Chief Complaint: Obstructive Sleep Apnea visit  Brief History:  Thomas Mercado is seen today for an annual follow up visit for CPAP@ 7 cmH2O. The patient has a 14 year history of sleep apnea. Patient is using PAP nightly.  The patient feels rested after sleeping with PAP.  The patient reports benefiting from PAP use. Reported sleepiness is improved and the Epworth Sleepiness Score is 5 out of 24. The patient very seldom take naps. The patient complains of the following: pt claims he gets woken up in between and will have trouble returning to sleep. He has no problem falling asleep initially, but will wake in the middle of the night and start ruminating and making lists of things he needs to do. 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 The compliance download shows 98% compliance with an average use time of 7 hours 48 minutes. The AHI is 2.2.  The patient does not complain of limb movements disrupting sleep. The patient continues to require PAP therapy in order to eliminate sleep apnea.   ROS  General: (-) fever, (-) chills, (-) night sweat Nose and Sinuses: (-) nasal stuffiness or itchiness, (-) postnasal drip, (-) nosebleeds, (-) sinus trouble. Mouth and Throat: (-) sore throat, (-) hoarseness. Neck: (-) swollen glands, (-) enlarged thyroid, (-) neck pain. Respiratory: - cough, - shortness of breath, - wheezing. Neurologic: - numbness, - tingling. Psychiatric: - anxiety, - depression   Current Medication: Outpatient Encounter Medications as of 04/26/2023  Medication Sig   aspirin EC 81 MG tablet Take 81 mg by mouth daily.   benazepril (LOTENSIN) 5 MG tablet Take 1 tablet (5 mg total) by  mouth 2 (two) times daily.   benzonatate (TESSALON) 100 MG capsule Take 1 capsule (100 mg total) by mouth 2 (two) times daily as needed for cough.   cetirizine (ZYRTEC) 10 MG tablet Take 1 tablet (10 mg total) by mouth daily.   fluticasone (FLONASE) 50 MCG/ACT nasal spray Place 2 sprays into both nostrils daily. (Patient taking differently: Place 1 spray into both nostrils daily as needed for allergies.)   omeprazole (PRILOSEC) 40 MG capsule Take 1 capsule (40 mg total) by mouth daily.   rosuvastatin (CRESTOR) 10 MG tablet Take 1 tablet (10 mg total) by mouth daily.   No facility-administered encounter medications on file as of 04/26/2023.    Surgical History: Past Surgical History:  Procedure Laterality Date   ABLATION  02/2013   heart   ABLATION  12/2015   heart   BASAL CELL CARCINOMA EXCISION Left 06/29/2022   left foot   COLONOSCOPY WITH PROPOFOL N/A 10/23/2015   Procedure: COLONOSCOPY WITH PROPOFOL;  Surgeon: Christena Deem, MD;  Location: [facility] ENDOSCOPY;  Service: Endoscopy;  Laterality: N/A;   KNEE ARTHROSCOPY Left 04/09/2015   Procedure: ,left knee arthroscopy, limited synovectomy and partial medial menisectomy.;  Surgeon: Juanell Fairly, MD;  Location: [facility] ORS;  Service: Orthopedics;  Laterality: Left;   SHOULDER ARTHROSCOPY WITH OPEN ROTATOR CUFF REPAIR AND DISTAL CLAVICLE ACROMINECTOMY Left 05/20/2021   Procedure: SHOULDER ARTHROSCOPY WITH OPEN ROTATOR CUFF REPAIR AND DISTAL CLAVICLE ACROMINECTOMY;  Surgeon: Juanell Fairly, MD;  Location: [facility] ORS;  Service: Orthopedics;  Laterality: Left;   VARICOCELE EXCISION  Medical History: Past Medical History:  Diagnosis Date   Allergy    Aortic atherosclerosis (HCC) 07/31/2017   Chest x-ray February 2019   Atrial fibrillation [facility]   Atrial fibrillation (HCC)    CPAP (continuous positive airway pressure) dependence    Dysrhythmia    ED (erectile dysfunction)    GERD  (gastroesophageal reflux disease)    Heart murmur    Hyperlipidemia    Hypertension    Sleep apnea    Sleep apnea     Family History: Non contributory to the present illness  Social History: Social History   Socioeconomic History   Marital status: Married    Spouse name: Not on file   Number of children: Not on file   Years of education: Not on file   Highest education level: Associate degree: occupational, Scientist, product/process development, or vocational program  Occupational History   Occupation: full time    Employer: [employer]  Tobacco Use   Smoking status: Never   Smokeless tobacco: Never  Vaping Use   Vaping status: Never Used  Substance and Sexual Activity   Alcohol use: Yes    Alcohol/week: 0.0 standard drinks of alcohol    Comment: on occasion with beer or liquor   Drug use: No   Sexual activity: Yes  Other Topics Concern   Not on file  Social History Narrative   Not on file   Social Determinants of Health   Financial Resource Strain: Low Risk  (11/01/2022)   Overall Financial Resource Strain (CARDIA)    Difficulty of Paying Living Expenses: Not hard at all  Food Insecurity: No Food Insecurity (11/01/2022)   Hunger Vital Sign    Worried About Running Out of Food in the Last Year: Never true    Ran Out of Food in the Last Year: Never true  Transportation Needs: No Transportation Needs (11/01/2022)   PRAPARE - Administrator, Civil Service (Medical): No    Lack of Transportation (Non-Medical): No  Physical Activity: Insufficiently Active (11/01/2022)   Exercise Vital Sign    Days of Exercise per Week: 1 day    Minutes of Exercise per Session: 130 min  Stress: No Stress Concern Present (11/01/2022)   [employer] of Occupational Health - Occupational Stress Questionnaire    Feeling of Stress : Only a little  Social Connections: Unknown (11/01/2022)   Social Connection and Isolation Panel [NHANES]    Frequency of Communication with Friends and Family: More  than three times a week    Frequency of Social Gatherings with Friends and Family: Twice a week    Attends Religious Services: Patient declined    Database administrator or Organizations: Yes    Attends Engineer, structural: More than 4 times per year    Marital Status: Married  Catering manager Violence: Not At Risk (04/05/2022)   Humiliation, Afraid, Rape, and Kick questionnaire    Fear of Current or Ex-Partner: No    Emotionally Abused: No    Physically Abused: No    Sexually Abused: No    Vital Signs: Blood pressure 129/87, pulse 75, resp. rate 16, height 6\' 2"  (1.88 m), weight 251 lb (113.9 kg), SpO2 97%. Body mass index is 32.23 kg/m.    Examination: General Appearance: The patient is well-developed, well-nourished, and in no distress. Neck Circumference: 41 cm Skin: Gross inspection of skin unremarkable. Head: normocephalic, no gross deformities. Eyes: no gross deformities noted.  ENT: ears appear grossly normal Neurologic: Alert and oriented. No involuntary movements.  STOP BANG RISK ASSESSMENT S (snore) Have you been told that you snore?     NO   T (tired) Are you often tired, fatigued, or sleepy during the day?   NO  O (obstruction) Do you stop breathing, choke, or gasp during sleep? NO   P (pressure) Do you have or are you being treated for high blood pressure? YES   B (BMI) Is your body index greater than 35 kg/m? NO   A (age) Are you 63 years old or older? YES   N (neck) Do you have a neck circumference greater than 16 inches?   NO   G (gender) Are you a male? YES   TOTAL STOP/BANG "YES" ANSWERS 3       A STOP-Bang score of 2 or less is considered low risk, and a score of 5 or more is high risk for having either moderate or severe OSA. For people who score 3 or 4, doctors may need to perform further assessment to determine how likely they are to have OSA.         EPWORTH SLEEPINESS SCALE:  Scale:  (0)= no chance of dozing; (1)= slight chance  of dozing; (2)= moderate chance of dozing; (3)= high chance of dozing  Chance  Situtation    Sitting and reading: 1    Watching TV: 1    Sitting Inactive in public: 0    As a passenger in car: 1      Lying down to rest: 1    Sitting and talking: 0    Sitting quielty after lunch: 1    In a car, stopped in traffic: 0   TOTAL SCORE:   5 out of 24    SLEEP STUDIES:  PSG (12/2008) AHI 10/hr, Supine AHI 22/hr, RERA 27/hr, Supine RERA 56/hr, min SpO2 85% Titration (01/2009) CPAP@ 7 cmH2O   CPAP COMPLIANCE DATA:  Date Range: 04/24/2022-04/23/2023  Average Daily Use: 7 hours 48 minutes  Median Use: 7 hours 54 minutes  Compliance for > 4 Hours: 98%  AHI: 2.2 respiratory events per hour  Days Used: 363/365 days  Mask Leak: 7.1  95th Percentile Pressure: 7         LABS: No results found for this or any previous visit (from the past 2160 hour(s)).  Radiology: US Abdomen Limited RUQ (LIVER/GB)  Result Date: 09/05/2022 CLINICAL DATA:  Probable gallbladder polyp on prior CT EXAM: ULTRASOUND ABDOMEN LIMITED RIGHT UPPER QUADRANT COMPARISON:  CT abdomen pelvis 08/18/2022 FINDINGS: Gallbladder: Note is made of a 7 mm gallbladder polyp. No gallbladder wall thickening or pericholecystic fluid. Negative sonographic Murphy's sign. Common bile duct: Diameter: 4 mm Liver: Increased echogenicity. No focal lesion. Portal vein is patent on color Doppler imaging with normal direction of blood flow towards the liver. Other: None. IMPRESSION: 1. There is a 7 mm gallbladder polyp. Recommend follow-up ultrasound in 1 year. 2. Increased hepatic parenchymal echogenicity suggestive of steatosis. Electronically Signed   By: Annia Belt M.D.   On: 09/05/2022 10:31    No results found.  No results found.    Assessment and Plan: Patient Active Problem List   Diagnosis Date Noted   Acute cystitis with hematuria 08/02/2022   Allergic reaction to alpha-gal 02/15/2021   OSA on CPAP  01/05/2021   CPAP use counseling 01/05/2021   Obesity (BMI 30-39.9) 01/05/2021   Paroxysmal atrial fibrillation (HCC) 01/05/2021   Pneumatosis of  intestines 06/05/2020   Trigger finger of right hand 11/28/2017   Aortic atherosclerosis (HCC) 07/31/2017   Solitary pulmonary nodule 07/31/2017   Essential hypertension 03/28/2017   Infection of parotid gland 01/26/2016   Knee pain, left 04/06/2015   Allergic rhinitis 03/10/2015   Hypogonadism in male 03/10/2015   Non-alcoholic fatty liver disease 03/10/2015   Obstructive apnea 03/12/2013   Acid reflux 02/07/2013   Hyperlipidemia 02/07/2013      The patient does tolerate PAP and reports benefit from PAP use. The patient was reminded how to adjust mask fit and advised to change supplies regularly. The patient was also counselled on nightly use. The compliance is excellent. The AHI is 2.2. Patient continues to require PAP to treat their apnea and is medically necessary.   1. OSA on CPAP Continue excellent compliance  2. CPAP use counseling CPAP couseling-Discussed importance of adequate CPAP use as well as proper care and cleaning techniques of machine and all supplies.  3. Insomnia, unspecified type Pt reports ruminating and has a lot going on right now with building a house and having new preemie grandbabies. Feels this will go away as things calm down. Advised to follow sleep hygiene, stimulus control and active thinking recommendations.   4. Essential hypertension Continue current medication and f/u with PCP.  5. Obesity (BMI 30-39.9) Obesity Counseling: Had a lengthy discussion regarding patients BMI and weight issues. Patient was instructed on portion control as well as increased activity. Also discussed caloric restrictions with trying to maintain intake less than 2000 Kcal. Discussions were made in accordance with the 5As of weight management. Simple actions such as not eating late and if able to, taking a walk is  suggested.    General Counseling: I have discussed the findings of the evaluation and examination with Kiley.  I have also discussed any further diagnostic evaluation thatmay be needed or ordered today. Emani verbalizes understanding of the findings of todays visit. We also reviewed his medications today and discussed drug interactions and side effects including but not limited excessive drowsiness and altered mental states. We also discussed that there is always a risk not just to him but also people around him. he has been encouraged to call the office with any questions or concerns that should arise related to todays visit.  No orders of the defined types were placed in this encounter.       I have personally obtained a history, examined the patient, evaluated laboratory and imaging results, formulated the assessment and plan and placed orders.  This patient was seen by Lynn Ito, PA-C in collaboration with Dr. Freda Munro as a part of collaborative care agreement.  Yevonne Pax, MD [facility] Diplomate ABMS Pulmonary Critical Care Medicine and Sleep Medicine

## 2023-04-26 ENCOUNTER — Ambulatory Visit (INDEPENDENT_AMBULATORY_CARE_PROVIDER_SITE_OTHER): Payer: Medicare HMO | Admitting: Internal Medicine

## 2023-04-26 VITALS — BP 129/87 | HR 75 | Resp 16 | Ht 74.0 in | Wt 251.0 lb

## 2023-04-26 DIAGNOSIS — G4733 Obstructive sleep apnea (adult) (pediatric): Secondary | ICD-10-CM | POA: Diagnosis not present

## 2023-04-26 DIAGNOSIS — E669 Obesity, unspecified: Secondary | ICD-10-CM

## 2023-04-26 DIAGNOSIS — Z7189 Other specified counseling: Secondary | ICD-10-CM

## 2023-04-26 DIAGNOSIS — I10 Essential (primary) hypertension: Secondary | ICD-10-CM | POA: Diagnosis not present

## 2023-04-26 DIAGNOSIS — G4700 Insomnia, unspecified: Secondary | ICD-10-CM

## 2023-04-26 NOTE — Patient Instructions (Signed)

## 2023-05-15 ENCOUNTER — Ambulatory Visit: Payer: Medicare HMO | Admitting: Emergency Medicine

## 2023-05-15 VITALS — Ht 75.0 in | Wt 252.0 lb

## 2023-05-15 DIAGNOSIS — Z0000 Encounter for general adult medical examination without abnormal findings: Secondary | ICD-10-CM | POA: Diagnosis not present

## 2023-05-15 NOTE — Patient Instructions (Addendum)
Thomas Mercado , Thank you for taking time to come for your Medicare Wellness Visit. I appreciate your ongoing commitment to your health goals. Please review the following plan we discussed and let me know if I can assist you in the future.   Referrals/Orders/Follow-Ups/Clinician Recommendations: Get the flu shot at your earliest convenience. You will be due for a tetanus shot January 2025. You can get this at your appointment 07/17/23.  This is a list of the screening recommended for you and due dates:  Health Maintenance  Topic Date Due   COVID-19 Vaccine (5 - 2023-24 season) 02/19/2023   Flu Shot  09/18/2023*   DTaP/Tdap/Td vaccine (2 - Td or Tdap) 07/05/2023   Medicare Annual Wellness Visit  05/14/2024   Colon Cancer Screening  09/30/2030   Pneumonia Vaccine  Completed   Hepatitis C Screening  Completed   Zoster (Shingles) Vaccine  Completed   HPV Vaccine  Aged Out  *Topic was postponed. The date shown is not the original due date.    Advanced directives: (Copy Requested) Please bring a copy of your health care power of attorney and living will to the office to be added to your chart at your convenience.  Next Medicare Annual Wellness Visit scheduled for next year: Yes,05/21/24 @ 8:00am

## 2023-05-15 NOTE — Progress Notes (Signed)
Subjective:   Thomas Mercado is a 67 y.o. male who presents for Medicare Annual/Subsequent preventive examination.  Visit Complete: Virtual I connected with  Thomas Mercado on 05/15/23 by a audio enabled telemedicine application and verified that I am speaking with the correct person using two identifiers.  Patient Location: Home  Provider Location: Home Office  I discussed the limitations of evaluation and management by telemedicine. The patient expressed understanding and agreed to proceed.  Vital Signs: Because this visit was a virtual/telehealth visit, some criteria may be missing or patient reported. Any vitals not documented were not able to be obtained and vitals that have been documented are patient reported.  Patient Medicare AWV questionnaire was completed by the patient on 05/14/23; I have confirmed that all information answered by patient is correct and no changes since this date.  Cardiac Risk Factors include: advanced age (>4men, >40 women);male gender;hypertension;dyslipidemia;obesity (BMI >30kg/m2);Other (see comment), Risk factor comments: OSA (cpap)     Objective:    Today's Vitals   05/15/23 0813  Weight: 252 lb (114.3 kg)  Height: 6\' 3"  (1.905 m)   Body mass index is 31.5 kg/m.     05/15/2023    8:22 AM 04/05/2022   11:33 AM 05/20/2021    9:51 AM 05/17/2021   10:34 AM 03/18/2021    4:33 PM 08/28/2017   10:41 AM 08/21/2017   12:59 PM  Advanced Directives  Does Patient Have a Medical Advance Directive? Yes No No No No No No  Type of Estate agent of [place];Living will        Does patient want to make changes to medical advance directive? No - Patient declined  No - Patient declined No - Guardian declined     Copy of Healthcare Power of Attorney in Chart? No - copy requested        Would patient like information on creating a medical advance directive?  No - Patient declined No - Patient declined No - Patient declined No - Patient declined  No - Patient declined No - Patient declined    Current Medications (verified) Outpatient Encounter Medications as of 05/15/2023  Medication Sig   aspirin EC 81 MG tablet Take 81 mg by mouth daily.   benazepril (LOTENSIN) 5 MG tablet Take 1 tablet (5 mg total) by mouth 2 (two) times daily.   cetirizine (ZYRTEC) 10 MG tablet Take 1 tablet (10 mg total) by mouth daily.   fluticasone (FLONASE) 50 MCG/ACT nasal spray Place 2 sprays into both nostrils daily. (Patient taking differently: Place 1 spray into both nostrils daily as needed for allergies.)   omeprazole (PRILOSEC) 40 MG capsule Take 1 capsule (40 mg total) by mouth daily.   rosuvastatin (CRESTOR) 10 MG tablet Take 1 tablet (10 mg total) by mouth daily.   benzonatate (TESSALON) 100 MG capsule Take 1 capsule (100 mg total) by mouth 2 (two) times daily as needed for cough. (Patient not taking: Reported on 05/15/2023)   No facility-administered encounter medications on file as of 05/15/2023.    Allergies (verified) Alpha-gal and Penicillin g benzathine   History: Past Medical History:  Diagnosis Date   Allergy    Aortic atherosclerosis (HCC) 07/31/2017   Chest x-ray February 2019   Atrial fibrillation [facility]   Atrial fibrillation (HCC)    CPAP (continuous positive airway pressure) dependence    Dysrhythmia    ED (erectile dysfunction)    GERD (gastroesophageal reflux disease)  Heart murmur    Hyperlipidemia    Hypertension    Sleep apnea    Sleep apnea    Past Surgical History:  Procedure Laterality Date   ABLATION  02/2013   heart   ABLATION  12/2015   heart   BASAL CELL CARCINOMA EXCISION Left 06/29/2022   left foot   COLONOSCOPY WITH PROPOFOL N/A 10/23/2015   Procedure: COLONOSCOPY WITH PROPOFOL;  Surgeon: Christena Deem, MD;  Location: [facility] ENDOSCOPY;  Service: Endoscopy;  Laterality: N/A;   KNEE ARTHROSCOPY Left 04/09/2015   Procedure: ,left knee arthroscopy, limited  synovectomy and partial medial menisectomy.;  Surgeon: Juanell Fairly, MD;  Location: [facility] ORS;  Service: Orthopedics;  Laterality: Left;   SHOULDER ARTHROSCOPY WITH OPEN ROTATOR CUFF REPAIR AND DISTAL CLAVICLE ACROMINECTOMY Left 05/20/2021   Procedure: SHOULDER ARTHROSCOPY WITH OPEN ROTATOR CUFF REPAIR AND DISTAL CLAVICLE ACROMINECTOMY;  Surgeon: Juanell Fairly, MD;  Location: [facility] ORS;  Service: Orthopedics;  Laterality: Left;   VARICOCELE EXCISION     Family History  Problem Relation Age of Onset   Breast cancer Mother    Alzheimer's disease Father    Hypertension Father    Arthritis Father    Heart disease Brother    Emphysema Brother    Social History   Socioeconomic History   Marital status: Married    Spouse name: Thomas Mercado   Number of children: 1   Years of education: Not on file   Highest education level: Associate degree: occupational, Scientist, product/process development, or vocational program  Occupational History   Occupation: full time    Employer: [employer]   Occupation: retired  Tobacco Use   Smoking status: Never   Smokeless tobacco: Never  Vaping Use   Vaping status: Never Used  Substance and Sexual Activity   Alcohol use: Yes    Alcohol/week: 1.0 standard drink of alcohol    Types: 1 Cans of beer per week    Comment: 1 beer or cocktail once a week   Drug use: No   Sexual activity: Yes  Other Topics Concern   Not on file  Social History Narrative   Not on file   Social Determinants of Health   Financial Resource Strain: Low Risk  (05/14/2023)   Overall Financial Resource Strain (CARDIA)    Difficulty of Paying Living Expenses: Not hard at all  Food Insecurity: No Food Insecurity (05/14/2023)   Hunger Vital Sign    Worried About Running Out of Food in the Last Year: Never true    Ran Out of Food in the Last Year: Never true  Transportation Needs: No Transportation Needs (05/14/2023)   PRAPARE - Administrator, Civil Service (Medical): No    Lack of  Transportation (Non-Medical): No  Physical Activity: Unknown (05/14/2023)   Exercise Vital Sign    Days of Exercise per Week: 1 day    Minutes of Exercise per Session: Patient declined  Stress: Stress Concern Present (05/14/2023)   [employer] of Occupational Health - Occupational Stress Questionnaire    Feeling of Stress : To some extent  Social Connections: Socially Integrated (05/14/2023)   Social Connection and Isolation Panel [NHANES]    Frequency of Communication with Friends and Family: More than three times a week    Frequency of Social Gatherings with Friends and Family: Once a week    Attends Religious Services: 1 to 4 times per year    Active Member of [employer] or Organizations: Yes    Attends Club or  Organization Meetings: More than 4 times per year    Marital Status: Married    Tobacco Counseling Counseling given: Not Answered   Clinical Intake:  Pre-visit preparation completed: Yes  Pain : No/denies pain     BMI - recorded: 31.5 Nutritional Status: BMI > 30  Obese Nutritional Risks: None Diabetes: No  How often do you need to have someone help you when you read instructions, pamphlets, or other written materials from your doctor or pharmacy?: 2 - Rarely  Interpreter Needed?: No  Information entered by :: Tora Kindred, CMA   Activities of Daily Living    05/14/2023   10:16 AM  In your present state of health, do you have any difficulty performing the following activities:  Hearing? 0  Vision? 0  Difficulty concentrating or making decisions? 0  Walking or climbing stairs? 0  Dressing or bathing? 0  Doing errands, shopping? 0  Preparing Food and eating ? N  Using the Toilet? N  In the past six months, have you accidently leaked urine? N  Do you have problems with loss of bowel control? N  Managing your Medications? N  Managing your Finances? N  Housekeeping or managing your Housekeeping? N    Patient Care Team: Larae Grooms, NP as PCP  - General Glory Buff, RN as Registered Nurse  Indicate any recent Medical Services you may have received from other than [facility] providers in the past year (date may be approximate).     Assessment:   This is a routine wellness examination for Keny.  Hearing/Vision screen Hearing Screening - Comments:: Denies hearing loss Vision Screening - Comments:: Gets eye exams   Goals Addressed               This Visit's Progress     Weight (lb) < 230 lb (104.3 kg) (pt-stated)   252 lb (114.3 kg)     Depression Screen    05/15/2023    8:20 AM 01/12/2023    8:31 AM 01/12/2023    8:27 AM 11/03/2022    8:11 AM 08/02/2022    1:16 PM 07/13/2022    8:48 AM 04/05/2022   11:44 AM  PHQ 2/9 Scores  PHQ - 2 Score 0 0 0 0 0 0 0  PHQ- 9 Score 0 3 0 0 2 5 0    Fall Risk    05/14/2023   10:16 AM 01/12/2023    8:31 AM 01/12/2023    8:27 AM 11/03/2022    8:11 AM 08/02/2022    1:16 PM  Fall Risk   Falls in the past year? 0 0 0 0 0  Number falls in past yr: 0 0 0 0 0  Injury with Fall? 0 0 0 0 0  Risk for fall due to : No Fall Risks No Fall Risks No Fall Risks No Fall Risks No Fall Risks  Follow up Falls prevention discussed Falls evaluation completed Falls evaluation completed Falls evaluation completed Falls evaluation completed    MEDICARE RISK AT HOME: Medicare Risk at Home Any stairs in or around the home?: Yes If so, are there any without handrails?: Yes Home free of loose throw rugs in walkways, pet beds, electrical cords, etc?: Yes Adequate lighting in your home to reduce risk of falls?: Yes Life alert?: No Use of a cane, walker or w/c?: No Grab bars in the bathroom?: Yes Shower chair or bench in shower?: No Elevated toilet seat or a handicapped toilet?: Yes  TIMED UP AND GO:  Was the test performed?  No    Cognitive Function:        05/15/2023    8:23 AM 04/05/2022   11:34 AM 03/18/2021    4:34 PM  6CIT Screen  What Year? 0 points 0 points 0 points  What month? 0  points 0 points 0 points  What time? 0 points 0 points 0 points  Count back from 20 0 points 0 points 0 points  Months in reverse 0 points 0 points 0 points  Repeat phrase 0 points 0 points 0 points  Total Score 0 points 0 points 0 points    Immunizations Immunization History  Administered Date(s) Administered   Fluad Quad(high Dose 65+) 06/02/2022   Influenza,inj,Quad PF,6+ Mos 03/11/2015, 04/18/2018, 04/16/2019, 03/11/2020   Influenza-Unspecified 03/28/2017, 03/11/2020, 04/14/2021   PFIZER(Purple Top)SARS-COV-2 Vaccination 09/06/2019, 10/02/2019, 05/27/2020, 04/14/2021   Pneumococcal Conjugate-13 12/25/2019   Pneumococcal Polysaccharide-23 04/16/2021   Tdap 07/04/2013   Zoster Recombinant(Shingrix) 04/16/2019, 08/06/2019   Zoster, Live 01/26/2016    TDAP status: Due, Education has been provided regarding the importance of this vaccine. Advised may receive this vaccine at local pharmacy or Health Dept. Aware to provide a copy of the vaccination record if obtained from local pharmacy or Health Dept. Verbalized acceptance and understanding.  Flu Vaccine status: Due, Education has been provided regarding the importance of this vaccine. Advised may receive this vaccine at local pharmacy or Health Dept. Aware to provide a copy of the vaccination record if obtained from local pharmacy or Health Dept. Verbalized acceptance and understanding.  Pneumococcal vaccine status: Up to date  Covid-19 vaccine status: Declined, Education has been provided regarding the importance of this vaccine but patient still declined. Advised may receive this vaccine at local pharmacy or Health Dept.or vaccine clinic. Aware to provide a copy of the vaccination record if obtained from local pharmacy or Health Dept. Verbalized acceptance and understanding.  Qualifies for Shingles Vaccine? Yes   Zostavax completed No   Shingrix Completed?: Yes  Screening Tests Health Maintenance  Topic Date Due   INFLUENZA  VACCINE  01/19/2023   COVID-19 Vaccine (5 - 2023-24 season) 02/19/2023   DTaP/Tdap/Td (2 - Td or Tdap) 07/05/2023   Medicare Annual Wellness (AWV)  05/14/2024   Colonoscopy  09/30/2030   Pneumonia Vaccine 77+ Years old  Completed   Hepatitis C Screening  Completed   Zoster Vaccines- Shingrix  Completed   HPV VACCINES  Aged Out    Health Maintenance  Health Maintenance Due  Topic Date Due   INFLUENZA VACCINE  01/19/2023   COVID-19 Vaccine (5 - 2023-24 season) 02/19/2023    Colorectal cancer screening: Type of screening: Colonoscopy. Completed 09/29/20. Repeat every 10 years  Lung Cancer Screening: (Low Dose CT Chest recommended if Age 39-80 years, 20 pack-year currently smoking OR have quit w/in 15years.) does not qualify.   Lung Cancer Screening Referral: n/a  Additional Screening:  Hepatitis C Screening: does not qualify; Completed n/a  Vision Screening: Recommended annual ophthalmology exams for early detection of glaucoma and other disorders of the eye.  Dental Screening: Recommended annual dental exams for proper oral hygiene  Community Resource Referral / Chronic Care Management: CRR required this visit?  No   CCM required this visit?  No     Plan:     I have personally reviewed and noted the following in the patient's chart:   Medical and social history Use of alcohol, tobacco or illicit drugs  Current medications and supplements including opioid prescriptions.  Patient is not currently taking opioid prescriptions. Functional ability and status Nutritional status Physical activity Advanced directives List of other physicians Hospitalizations, surgeries, and ER visits in previous 12 months Vitals Screenings to include cognitive, depression, and falls Referrals and appointments  In addition, I have reviewed and discussed with patient certain preventive protocols, quality metrics, and best practice recommendations. A written personalized care plan for  preventive services as well as general preventive health recommendations were provided to patient.     Tora Kindred, CMA   05/15/2023   After Visit Summary: (MyChart) Due to this being a telephonic visit, the after visit summary with patients personalized plan was offered to patient via MyChart   Nurse Notes:  Needs flu shot Needs Tdap shot 06/2023, will get at next OV 07/17/23 Declined covid vaccine

## 2023-06-11 ENCOUNTER — Other Ambulatory Visit: Payer: Self-pay | Admitting: Nurse Practitioner

## 2023-06-13 NOTE — Telephone Encounter (Signed)
Requested Prescriptions  Pending Prescriptions Disp Refills   benazepril (LOTENSIN) 5 MG tablet [Pharmacy Med Name: BENAZEPRIL HCL 5 MG TABLET] 180 tablet 0    Sig: TAKE 1 TABLET BY MOUTH 2 TIMES A DAY **DOSE INCREASE**     Cardiovascular:  ACE Inhibitors Passed - 06/13/2023 10:15 AM      Passed - Cr in normal range and within 180 days    Creatinine  Date Value Ref Range Status  09/01/2012 1.19 0.60 - 1.30 mg/dL Final   Creatinine, Ser  Date Value Ref Range Status  01/12/2023 0.94 0.76 - 1.27 mg/dL Final         Passed - K in normal range and within 180 days    Potassium  Date Value Ref Range Status  01/12/2023 4.8 3.5 - 5.2 mmol/L Final  09/01/2012 3.9 3.5 - 5.1 mmol/L Final         Passed - Patient is not pregnant      Passed - Last BP in normal range    BP Readings from Last 1 Encounters:  04/26/23 129/87         Passed - Valid encounter within last 6 months    Recent Outpatient Visits           4 months ago COVID-19   [facility] Mecum, Oswaldo Conroy, PA-C   5 months ago Annual physical exam   [facility] Mecum, Oswaldo Conroy, PA-C   7 months ago Right upper quadrant abdominal pain   [facility] Larae Grooms, NP   10 months ago Acute cystitis with hematuria   [facility] Larae Grooms, NP   11 months ago Aortic atherosclerosis [facility])   [facility] Larae Grooms, NP       Future Appointments             In 1 month Larae Grooms, NP [place]

## 2023-07-17 ENCOUNTER — Ambulatory Visit: Payer: Medicare HMO | Admitting: Nurse Practitioner

## 2023-07-17 ENCOUNTER — Encounter: Payer: Self-pay | Admitting: Nurse Practitioner

## 2023-07-17 VITALS — BP 123/83 | HR 79 | Temp 97.8°F | Ht 75.0 in | Wt 252.0 lb

## 2023-07-17 DIAGNOSIS — E785 Hyperlipidemia, unspecified: Secondary | ICD-10-CM | POA: Diagnosis not present

## 2023-07-17 DIAGNOSIS — Z23 Encounter for immunization: Secondary | ICD-10-CM | POA: Diagnosis not present

## 2023-07-17 DIAGNOSIS — K219 Gastro-esophageal reflux disease without esophagitis: Secondary | ICD-10-CM | POA: Diagnosis not present

## 2023-07-17 DIAGNOSIS — E669 Obesity, unspecified: Secondary | ICD-10-CM | POA: Diagnosis not present

## 2023-07-17 DIAGNOSIS — R7303 Prediabetes: Secondary | ICD-10-CM | POA: Diagnosis not present

## 2023-07-17 DIAGNOSIS — T781XXA Other adverse food reactions, not elsewhere classified, initial encounter: Secondary | ICD-10-CM | POA: Diagnosis not present

## 2023-07-17 DIAGNOSIS — I480 Paroxysmal atrial fibrillation: Secondary | ICD-10-CM | POA: Diagnosis not present

## 2023-07-17 DIAGNOSIS — I700 Atherosclerosis of aorta: Secondary | ICD-10-CM | POA: Diagnosis not present

## 2023-07-17 DIAGNOSIS — I10 Essential (primary) hypertension: Secondary | ICD-10-CM

## 2023-07-17 DIAGNOSIS — G4733 Obstructive sleep apnea (adult) (pediatric): Secondary | ICD-10-CM | POA: Diagnosis not present

## 2023-07-17 MED ORDER — BENAZEPRIL HCL 5 MG PO TABS: 5.0000 mg | ORAL_TABLET | Freq: Two times a day (BID) | ORAL | 1 refills | Status: DC

## 2023-07-17 MED ORDER — ROSUVASTATIN CALCIUM 10 MG PO TABS: 10.0000 mg | ORAL_TABLET | Freq: Every day | ORAL | 1 refills | Status: DC

## 2023-07-17 MED ORDER — OMEPRAZOLE 40 MG PO CPDR: 40.0000 mg | DELAYED_RELEASE_CAPSULE | Freq: Every day | ORAL | 1 refills | Status: DC

## 2023-07-17 NOTE — Assessment & Plan Note (Signed)
Chronic.  Controlled.  Crestor controlled at 10mg  daily. Refills sent today.  Labs ordered today.  Return to clinic in 6 months for reevaluation.  Call sooner if concerns arise.

## 2023-07-17 NOTE — Assessment & Plan Note (Signed)
Chronic.  Endorses 100% use of machine.  Recently had his yearly follow up with Sleep specialist.

## 2023-07-17 NOTE — Progress Notes (Signed)
BP 123/83 (BP Location: Left Arm, Patient Position: Sitting, Cuff Size: Large)   Pulse 79   Temp 97.8 F (36.6 C) (Oral)   Ht 6\' 3"  (1.905 m)   Wt 252 lb (114.3 kg)   SpO2 97%   BMI 31.50 kg/m    Subjective:    Patient ID: Thomas Mercado, male    DOB: 06/08/1956, 68 y.o.   MRN: [id number]  HPI: Thomas Mercado is a 68 y.o. male  Chief Complaint  Patient presents with   Hypertension   Knee Pain    Right knee, intermittent pain, pain is triggered when hit or positioned a certain way. No prior treatment    HYPERTENSION / HYPERLIPIDEMIA Satisfied with current treatment? yes Duration of hypertension: years BP monitoring frequency: sometimes BP range: 120-130/80 BP medication side effects: no Past BP meds: benazepril Duration of hyperlipidemia: years Cholesterol medication side effects: no Cholesterol supplements: none Past cholesterol medications: rosuvastatin (crestor) Medication compliance: excellent compliance Aspirin: no Recent stressors: no Recurrent headaches: no Visual changes: no Palpitations: no Dyspnea: no Chest pain: no Lower extremity edema: no Dizzy/lightheaded: no  SLEEP APNEA Using it nightly and feels like it is working well Sleep apnea status: controlled Duration: months Satisfied with current treatment?:  yes CPAP use:  yes Sleep quality with CPAP use: excellent Treament compliance:excellent compliance Last sleep study:  Treatments attempted:  Wakes feeling refreshed:  yes Daytime hypersomnolence:  no Fatigue:  no Insomnia:  no Good sleep hygiene:  no Difficulty falling asleep:  no Difficulty staying asleep:  no Snoring bothers bed partner:  no Observed apnea by bed partner: no Obesity:  no Hypertension: yes Pulmonary hypertension:  no Coronary artery disease:  no   Patient would like to be rested for alpha gal.  Dr. Eddie Candle at [facility] is who he see's for alpha gal. Phone number: [phone].  He is eating what he wants.  Every once in  awhile he has a problem but mostly doing fine.    Patient has been having right knee pain.  It is intermittent and triggered with turning his knee and when he lays in the bed and his left knee hits it causes pain.     Relevant past medical, surgical, family and social history reviewed and updated as indicated. Interim medical history since our last visit reviewed. Allergies and medications reviewed and updated.  Review of Systems  Eyes:  Negative for visual disturbance.  Respiratory:  Negative for chest tightness and shortness of breath.   Cardiovascular:  Negative for chest pain, palpitations and leg swelling.  Neurological:  Negative for dizziness, light-headedness and headaches.    Per HPI unless specifically indicated above     Objective:    BP 123/83 (BP Location: Left Arm, Patient Position: Sitting, Cuff Size: Large)   Pulse 79   Temp 97.8 F (36.6 C) (Oral)   Ht 6\' 3"  (1.905 m)   Wt 252 lb (114.3 kg)   SpO2 97%   BMI 31.50 kg/m   Wt Readings from Last 3 Encounters:  07/17/23 252 lb (114.3 kg)  05/15/23 252 lb (114.3 kg)  04/26/23 251 lb (113.9 kg)    Physical Exam Vitals and nursing note reviewed.  Constitutional:      General: He is not in acute distress.    Appearance: Normal appearance. He is not ill-appearing, toxic-appearing or diaphoretic.  HENT:     Head: Normocephalic.     Right Ear: External ear normal.     Left Ear: External ear normal.  Nose: Nose normal. No congestion or rhinorrhea.     Mouth/Throat:     Mouth: Mucous membranes are moist.  Eyes:     General:        Right eye: No discharge.        Left eye: No discharge.     Extraocular Movements: Extraocular movements intact.     Conjunctiva/sclera: Conjunctivae normal.     Pupils: Pupils are equal, round, and reactive to light.  Cardiovascular:     Rate and Rhythm: Normal rate and regular rhythm.     Heart sounds: No murmur heard. Pulmonary:     Effort: Pulmonary effort is normal. No  respiratory distress.     Breath sounds: Normal breath sounds. No wheezing, rhonchi or rales.  Abdominal:     General: Abdomen is flat. Bowel sounds are normal.  Musculoskeletal:     Cervical back: Normal range of motion and neck supple.  Skin:    General: Skin is warm and dry.     Capillary Refill: Capillary refill takes less than 2 seconds.  Neurological:     General: No focal deficit present.     Mental Status: He is alert and oriented to person, place, and time.  Psychiatric:        Mood and Affect: Mood normal.        Behavior: Behavior normal.        Thought Content: Thought content normal.        Judgment: Judgment normal.     Results for orders placed or performed in visit on 01/12/23  Microscopic Examination   Collection Time: 01/12/23  9:24 AM   Urine  Result Value Ref Range   WBC, UA 0-5 0 - 5 /hpf   RBC, Urine None seen 0 - 2 /hpf   Epithelial Cells (non renal) 0-10 0 - 10 /hpf   Bacteria, UA None seen None seen/Few  Urinalysis, Routine w reflex microscopic   Collection Time: 01/12/23  9:24 AM  Result Value Ref Range   Specific Gravity, UA >1.030 (H) 1.005 - 1.030   pH, UA 6.0 5.0 - 7.5   Color, UA Yellow Yellow   Appearance Ur Clear Clear   Leukocytes,UA Trace (A) Negative   Protein,UA Negative Negative/Trace   Glucose, UA Negative Negative   Ketones, UA Negative Negative   RBC, UA Negative Negative   Bilirubin, UA Negative Negative   Urobilinogen, Ur 0.2 0.2 - 1.0 mg/dL   Nitrite, UA Negative Negative   Microscopic Examination See below:   Comp Met (CMET)   Collection Time: 01/12/23  9:26 AM  Result Value Ref Range   Glucose 104 (H) 70 - 99 mg/dL   BUN 23 8 - 27 mg/dL   Creatinine, Ser 1.61 0.76 - 1.27 mg/dL   eGFR 88 >09 UE/AVW/0.98   BUN/Creatinine Ratio 24 10 - 24   Sodium 144 134 - 144 mmol/L   Potassium 4.8 3.5 - 5.2 mmol/L   Chloride 106 96 - 106 mmol/L   CO2 23 20 - 29 mmol/L   Calcium 9.8 8.6 - 10.2 mg/dL   Total Protein 7.1 6.0 - 8.5  g/dL   Albumin 4.7 3.9 - 4.9 g/dL   Globulin, Total 2.4 1.5 - 4.5 g/dL   Bilirubin Total 0.5 0.0 - 1.2 mg/dL   Alkaline Phosphatase 89 44 - 121 IU/L   AST 32 0 - 40 IU/L   ALT 51 (H) 0 - 44 IU/L  CBC w/Diff   Collection Time: 01/12/23  9:26 AM  Result Value Ref Range   WBC 4.5 3.4 - 10.8 x10E3/uL   RBC 4.96 4.14 - 5.80 x10E6/uL   Hemoglobin 14.5 13.0 - 17.7 g/dL   Hematocrit 16.1 09.6 - 51.0 %   MCV 89 79 - 97 fL   MCH 29.2 26.6 - 33.0 pg   MCHC 32.7 31.5 - 35.7 g/dL   RDW 04.5 40.9 - 81.1 %   Platelets 183 150 - 450 x10E3/uL   Neutrophils 51 Not Estab. %   Lymphs 33 Not Estab. %   Monocytes 11 Not Estab. %   Eos 4 Not Estab. %   Basos 1 Not Estab. %   Neutrophils Absolute 2.3 1.4 - 7.0 x10E3/uL   Lymphocytes Absolute 1.5 0.7 - 3.1 x10E3/uL   Monocytes Absolute 0.5 0.1 - 0.9 x10E3/uL   EOS (ABSOLUTE) 0.2 0.0 - 0.4 x10E3/uL   Basophils Absolute 0.1 0.0 - 0.2 x10E3/uL   Immature Granulocytes 0 Not Estab. %   Immature Grans (Abs) 0.0 0.0 - 0.1 x10E3/uL  Lipid Profile   Collection Time: 01/12/23  9:26 AM  Result Value Ref Range   Cholesterol, Total 132 100 - 199 mg/dL   Triglycerides 94 0 - 149 mg/dL   HDL 40 >91 mg/dL   VLDL Cholesterol Cal 18 5 - 40 mg/dL   LDL Chol Calc (NIH) 74 0 - 99 mg/dL   Chol/HDL Ratio 3.3 0.0 - 5.0 ratio  TSH   Collection Time: 01/12/23  9:26 AM  Result Value Ref Range   TSH 1.110 0.450 - 4.500 uIU/mL  HgB A1c   Collection Time: 01/12/23  9:26 AM  Result Value Ref Range   Hgb A1c MFr Bld 5.9 (H) 4.8 - 5.6 %   Est. average glucose Bld gHb Est-mCnc 123 mg/dL  PSA   Collection Time: 01/12/23  9:26 AM  Result Value Ref Range   Prostate Specific Ag, Serum 1.2 0.0 - 4.0 ng/mL  Alpha-Gal Panel   Collection Time: 01/12/23  9:26 AM  Result Value Ref Range   Class Description Allergens Comment    IgE (Immunoglobulin E), Serum 66 6 - 495 IU/mL   [id number] Alpha-Gal 0.28 (A) Class 0/I kU/L   Beef IgE 0.26 (A) Class 0/I kU/L   Pork IgE 0.18 (A)  Class 0/I kU/L   Allergen Lamb IgE <0.10 Class 0 kU/L      Assessment & Plan:   Problem List Items Addressed This Visit       Cardiovascular and Mediastinum   Essential hypertension   Chronic.  Controlled.  Continue with current medication regimen of Benzapril 5mg  BID.  Refills sent today.  Labs ordered today.  Return to clinic in 6 months for reevaluation.  Call sooner if concerns arise.       Relevant Medications   benazepril (LOTENSIN) 5 MG tablet   rosuvastatin (CRESTOR) 10 MG tablet   Other Relevant Orders   Comp Met (CMET)   Aortic atherosclerosis (HCC) - Primary   Controlled.  Continue with Crestor 10mg  daily.  Refills sent today.   Follow up in 6 months. Call sooner if concerns arise.       Relevant Medications   benazepril (LOTENSIN) 5 MG tablet   rosuvastatin (CRESTOR) 10 MG tablet   Paroxysmal atrial fibrillation (HCC)   Chronic.  Controlled.  Continue with current medication regimen.  Labs ordered today.  Return to clinic in 6 months for reevaluation.  Call sooner if concerns arise.  Follow by Cardiology.  Relevant Medications   benazepril (LOTENSIN) 5 MG tablet   rosuvastatin (CRESTOR) 10 MG tablet     Respiratory   Obstructive apnea   Chronic.  Endorses 100% use of machine.  Recently had his yearly follow up with Sleep specialist.        Digestive   Acid reflux   Relevant Medications   omeprazole (PRILOSEC) 40 MG capsule     Other   Hyperlipidemia   Chronic.  Controlled.  Crestor controlled at 10mg  daily. Refills sent today.  Labs ordered today.  Return to clinic in 6 months for reevaluation.  Call sooner if concerns arise.       Relevant Medications   benazepril (LOTENSIN) 5 MG tablet   rosuvastatin (CRESTOR) 10 MG tablet   Other Relevant Orders   Lipid Profile   Obesity (BMI 30-39.9)   Recommended eating smaller high protein, low fat meals more frequently and exercising 30 mins a day 5 times a week with a goal of 10-15lb weight loss in the  next 3 months.        Allergic reaction to alpha-gal   Labs checked during visit.  He is currently able to eat just about whatever he wants without symptoms.  Will make recommendations based on results.       Relevant Orders   Alpha-Gal Panel   Prediabetes   Labs ordered at visit today.  Will make recommendations based on lab results.        Relevant Orders   HgB A1c   Other Visit Diagnoses       Flu vaccine need       Relevant Orders   Flu Vaccine Trivalent High Dose (Fluad) (Completed)        Follow up plan: Return in about 6 months (around 01/14/2024) for Physical and Fasting labs.

## 2023-07-17 NOTE — Assessment & Plan Note (Signed)
Chronic.  Controlled.  Continue with current medication regimen.  Labs ordered today.  Return to clinic in 6 months for reevaluation.  Call sooner if concerns arise.  Follow by Cardiology.

## 2023-07-17 NOTE — Assessment & Plan Note (Signed)
Controlled.  Continue with Crestor 10mg  daily.  Refills sent today.   Follow up in 6 months. Call sooner if concerns arise.

## 2023-07-17 NOTE — Assessment & Plan Note (Signed)
Recommended eating smaller high protein, low fat meals more frequently and exercising 30 mins a day 5 times a week with a goal of 10-15lb weight loss in the next 3 months.

## 2023-07-17 NOTE — Assessment & Plan Note (Signed)
Labs ordered at visit today.  Will make recommendations based on lab results.

## 2023-07-17 NOTE — Assessment & Plan Note (Signed)
Labs checked during visit.  He is currently able to eat just about whatever he wants without symptoms.  Will make recommendations based on results.

## 2023-07-17 NOTE — Assessment & Plan Note (Signed)
Chronic.  Controlled.  Continue with current medication regimen of Benzapril 5mg  BID.  Refills sent today.  Labs ordered today.  Return to clinic in 6 months for reevaluation.  Call sooner if concerns arise.

## 2023-07-18 ENCOUNTER — Encounter: Payer: Self-pay | Admitting: Nurse Practitioner

## 2023-07-18 DIAGNOSIS — L608 Other nail disorders: Secondary | ICD-10-CM

## 2023-07-19 ENCOUNTER — Encounter: Payer: Self-pay | Admitting: Nurse Practitioner

## 2023-07-19 LAB — LIPID PANEL
Chol/HDL Ratio: 3.1 {ratio} (ref 0.0–5.0)
Cholesterol, Total: 126 mg/dL (ref 100–199)
HDL: 41 mg/dL (ref >39)
LDL Chol Calc (NIH): 65 mg/dL (ref 0–99)
Triglycerides: 109 mg/dL (ref 0–149)
VLDL Cholesterol Cal: 20 mg/dL (ref 5–40)

## 2023-07-19 LAB — ALPHA-GAL PANEL
Allergen Lamb IgE: 0.2 kU/L — AB
Beef IgE: 1.12 kU/L — AB
IgE (Immunoglobulin E), Serum: 54 [IU]/mL (ref 6–495)
O215-IgE Alpha-Gal: 0.77 kU/L — AB
Pork IgE: 0.53 kU/L — AB

## 2023-07-19 LAB — COMPREHENSIVE METABOLIC PANEL
ALT: 30 [IU]/L (ref 0–44)
AST: 20 [IU]/L (ref 0–40)
Albumin: 4.6 g/dL (ref 3.9–4.9)
Alkaline Phosphatase: 92 [IU]/L (ref 44–121)
BUN/Creatinine Ratio: 24 (ref 10–24)
BUN: 21 mg/dL (ref 8–27)
Bilirubin Total: 0.5 mg/dL (ref 0.0–1.2)
CO2: 24 mmol/L (ref 20–29)
Calcium: 9.7 mg/dL (ref 8.6–10.2)
Chloride: 103 mmol/L (ref 96–106)
Creatinine, Ser: 0.87 mg/dL (ref 0.76–1.27)
Globulin, Total: 2.4 g/dL (ref 1.5–4.5)
Glucose: 101 mg/dL — ABNORMAL HIGH (ref 70–99)
Potassium: 4.4 mmol/L (ref 3.5–5.2)
Sodium: 141 mmol/L (ref 134–144)
Total Protein: 7 g/dL (ref 6.0–8.5)
eGFR: 94 mL/min/{1.73_m2} (ref >59)

## 2023-07-19 LAB — HEMOGLOBIN A1C
Est. average glucose Bld gHb Est-mCnc: 120 mg/dL
Hgb A1c MFr Bld: 5.8 % — ABNORMAL HIGH (ref 4.8–5.6)

## 2023-07-27 ENCOUNTER — Ambulatory Visit: Payer: Medicare HMO | Admitting: Podiatry

## 2023-07-27 ENCOUNTER — Encounter: Payer: Self-pay | Admitting: Podiatry

## 2023-07-27 DIAGNOSIS — L603 Nail dystrophy: Secondary | ICD-10-CM | POA: Diagnosis not present

## 2023-07-27 NOTE — Progress Notes (Signed)
 Subjective:  Patient ID: Thomas Mercado, male    DOB: [birth year],  MRN: [id number]  Chief Complaint  Patient presents with   Nail Problem    The second toe on my right foot is ingrown.  It's painful once in a while.    68 y.o. male presents with the above complaint.  Patient presents with right second digit nail dystrophy painful to touch is progressive and worse worse with ambulation worse with pressure he would like to remove it he does not want to make appointment is not infected   Review of Systems: Negative except as noted in the HPI. Denies N/V/F/Ch.  Past Medical History:  Diagnosis Date   Allergy    Aortic atherosclerosis (HCC) 07/31/2017   Chest x-ray February 2019   Atrial fibrillation [facility]   Atrial fibrillation (HCC)    CPAP (continuous positive airway pressure) dependence    Dysrhythmia    ED (erectile dysfunction)    GERD (gastroesophageal reflux disease)    Heart murmur    Hyperlipidemia    Hypertension    Sleep apnea    Sleep apnea     Current Outpatient Medications:    aspirin  EC 81 MG tablet, Take 81 mg by mouth daily., Disp: , Rfl:    benazepril  (LOTENSIN ) 5 MG tablet, Take 1 tablet (5 mg total) by mouth 2 (two) times daily., Disp: 180 tablet, Rfl: 1   cetirizine  (ZYRTEC ) 10 MG tablet, Take 1 tablet (10 mg total) by mouth daily., Disp: 90 tablet, Rfl: 2   fluticasone  (FLONASE ) 50 MCG/ACT nasal spray, Place 2 sprays into both nostrils daily. (Patient taking differently: Place 1 spray into both nostrils daily as needed for allergies.), Disp: 16 g, Rfl: 12   Multiple Vitamin (MULTIVITAMIN) tablet, Take 1 tablet by mouth daily., Disp: , Rfl:    omeprazole  (PRILOSEC) 40 MG capsule, Take 1 capsule (40 mg total) by mouth daily., Disp: 90 capsule, Rfl: 1   rosuvastatin  (CRESTOR ) 10 MG tablet, Take 1 tablet (10 mg total) by mouth daily., Disp: 90 tablet, Rfl: 1  Social History   Tobacco Use  Smoking Status Never  Smokeless  Tobacco Never    Allergies  Allergen Reactions   Alpha-Gal Diarrhea    Aches and pains  Other Reaction(s): Not available   Penicillin G Benzathine Rash    As a child   Objective:  There were no vitals filed for this visit. There is no height or weight on file to calculate BMI. Constitutional Well developed. Well nourished.  Vascular Dorsalis pedis pulses palpable bilaterally. Posterior tibial pulses palpable bilaterally. Capillary refill normal to all digits.  No cyanosis or clubbing noted. Pedal hair growth normal.  Neurologic Normal speech. Oriented to person, place, and time. Epicritic sensation to light touch grossly present bilaterally.  Dermatologic Pain on palpation of the entire/total nail on 2nd digit of the right No other open wounds. No skin lesions.  Orthopedic: Normal joint ROM without pain or crepitus bilaterally. No visible deformities. No bony tenderness.   Radiographs: None Assessment:  No diagnosis found. Plan:  Patient was evaluated and treated and all questions answered.  Nail contusion/dystrophy second, right -Patient elects to proceed with minor surgery to remove entire toenail today. Consent reviewed and signed by patient. -Entire/total nail excised. See procedure note. -Educated on post-procedure care including soaking. Written instructions provided and reviewed. -Patient to follow up in 2 weeks for nail check.  Procedure: Excision of entire/total nail  Location:  Right 2nd toe digit Anesthesia: Lidocaine  1% plain; 1.5 mL and Marcaine  0.5% plain; 1.5 mL, digital block. Skin Prep: Betadine. Dressing: Silvadene; telfa; dry, sterile, compression dressing. Technique: Following skin prep, the toe was exsanguinated and a tourniquet was secured at the base of the toe. The affected nail border was freed and excised. The tourniquet was then removed and sterile dressing applied. Disposition: Patient tolerated procedure well. Patient to return in 2 weeks  for follow-up.   No follow-ups on file.

## 2023-08-21 DIAGNOSIS — M25561 Pain in right knee: Secondary | ICD-10-CM | POA: Diagnosis not present

## 2023-08-30 DIAGNOSIS — M25561 Pain in right knee: Secondary | ICD-10-CM | POA: Diagnosis not present

## 2023-09-13 ENCOUNTER — Encounter: Payer: Self-pay | Admitting: Nurse Practitioner

## 2023-09-13 ENCOUNTER — Other Ambulatory Visit: Payer: Self-pay | Admitting: Nurse Practitioner

## 2023-09-13 DIAGNOSIS — M25561 Pain in right knee: Secondary | ICD-10-CM | POA: Diagnosis not present

## 2023-09-14 NOTE — Telephone Encounter (Signed)
 Requested Prescriptions  Pending Prescriptions Disp Refills   benazepril (LOTENSIN) 5 MG tablet [Pharmacy Med Name: BENAZEPRIL HCL 5 MG TABLET] 180 tablet 1    Sig: TAKE 1 TABLET BY MOUTH 2 TIMES A DAY     Cardiovascular:  ACE Inhibitors Failed - 09/14/2023  5:08 PM      Failed - Valid encounter within last 6 months    Recent Outpatient Visits   None     Future Appointments             In 4 months Larae Grooms, NP [place] [facility]            Passed - Cr in normal range and within 180 days    Creatinine  Date Value Ref Range Status  09/01/2012 1.19 0.60 - 1.30 mg/dL Final   Creatinine, Ser  Date Value Ref Range Status  07/17/2023 0.87 0.76 - 1.27 mg/dL Final         Passed - K in normal range and within 180 days    Potassium  Date Value Ref Range Status  07/17/2023 4.4 3.5 - 5.2 mmol/L Final  09/01/2012 3.9 3.5 - 5.1 mmol/L Final         Passed - Patient is not pregnant      Passed - Last BP in normal range    BP Readings from Last 1 Encounters:  07/17/23 123/83

## 2023-10-12 DIAGNOSIS — Z85828 Personal history of other malignant neoplasm of skin: Secondary | ICD-10-CM | POA: Diagnosis not present

## 2023-10-12 DIAGNOSIS — Z872 Personal history of diseases of the skin and subcutaneous tissue: Secondary | ICD-10-CM | POA: Diagnosis not present

## 2023-10-12 DIAGNOSIS — Z859 Personal history of malignant neoplasm, unspecified: Secondary | ICD-10-CM | POA: Diagnosis not present

## 2023-10-12 DIAGNOSIS — L578 Other skin changes due to chronic exposure to nonionizing radiation: Secondary | ICD-10-CM | POA: Diagnosis not present

## 2023-10-12 DIAGNOSIS — Z86018 Personal history of other benign neoplasm: Secondary | ICD-10-CM | POA: Diagnosis not present

## 2023-10-12 DIAGNOSIS — D485 Neoplasm of uncertain behavior of skin: Secondary | ICD-10-CM | POA: Diagnosis not present

## 2023-10-12 DIAGNOSIS — L570 Actinic keratosis: Secondary | ICD-10-CM | POA: Diagnosis not present

## 2023-10-12 DIAGNOSIS — C44612 Basal cell carcinoma of skin of right upper limb, including shoulder: Secondary | ICD-10-CM | POA: Diagnosis not present

## 2023-10-31 DIAGNOSIS — C44612 Basal cell carcinoma of skin of right upper limb, including shoulder: Secondary | ICD-10-CM | POA: Diagnosis not present

## 2023-12-27 DIAGNOSIS — E7801 Familial hypercholesterolemia: Secondary | ICD-10-CM | POA: Diagnosis not present

## 2023-12-27 DIAGNOSIS — G4733 Obstructive sleep apnea (adult) (pediatric): Secondary | ICD-10-CM | POA: Diagnosis not present

## 2023-12-27 DIAGNOSIS — I10 Essential (primary) hypertension: Secondary | ICD-10-CM | POA: Diagnosis not present

## 2023-12-27 DIAGNOSIS — I700 Atherosclerosis of aorta: Secondary | ICD-10-CM | POA: Diagnosis not present

## 2023-12-27 DIAGNOSIS — I480 Paroxysmal atrial fibrillation: Secondary | ICD-10-CM | POA: Diagnosis not present

## 2024-01-07 ENCOUNTER — Other Ambulatory Visit: Payer: Self-pay | Admitting: Nurse Practitioner

## 2024-01-09 NOTE — Telephone Encounter (Signed)
 Requested Prescriptions  Pending Prescriptions Disp Refills   cetirizine  (ZYRTEC ) 10 MG tablet [Pharmacy Med Name: CETIRIZINE  HCL 10 MG TABLET] 90 tablet 1    Sig: TAKE 1 TABLET BY MOUTH DAILY     Ear, Nose, and Throat:  Antihistamines 2 Failed - 01/09/2024  4:00 PM      Failed - Valid encounter within last 12 months    Recent Outpatient Visits   None     Future Appointments             In 1 week Melvin Pao, NP [place] [facility]            Passed - Cr in normal range and within 360 days    Creatinine  Date Value Ref Range Status  09/01/2012 1.19 0.60 - 1.30 mg/dL Final   Creatinine, Ser  Date Value Ref Range Status  07/17/2023 0.87 0.76 - 1.27 mg/dL Final

## 2024-01-17 ENCOUNTER — Encounter: Payer: Self-pay | Admitting: Nurse Practitioner

## 2024-01-17 ENCOUNTER — Ambulatory Visit (INDEPENDENT_AMBULATORY_CARE_PROVIDER_SITE_OTHER): Payer: Self-pay | Admitting: Nurse Practitioner

## 2024-01-17 VITALS — BP 124/77 | HR 74 | Temp 97.9°F | Ht 75.0 in | Wt 252.0 lb

## 2024-01-17 DIAGNOSIS — R7303 Prediabetes: Secondary | ICD-10-CM | POA: Diagnosis not present

## 2024-01-17 DIAGNOSIS — E785 Hyperlipidemia, unspecified: Secondary | ICD-10-CM | POA: Diagnosis not present

## 2024-01-17 DIAGNOSIS — I10 Essential (primary) hypertension: Secondary | ICD-10-CM

## 2024-01-17 DIAGNOSIS — K219 Gastro-esophageal reflux disease without esophagitis: Secondary | ICD-10-CM

## 2024-01-17 DIAGNOSIS — Z0000 Encounter for general adult medical examination without abnormal findings: Secondary | ICD-10-CM

## 2024-01-17 DIAGNOSIS — I700 Atherosclerosis of aorta: Secondary | ICD-10-CM | POA: Diagnosis not present

## 2024-01-17 DIAGNOSIS — I480 Paroxysmal atrial fibrillation: Secondary | ICD-10-CM | POA: Diagnosis not present

## 2024-01-17 MED ORDER — OMEPRAZOLE 40 MG PO CPDR: 40.0000 mg | DELAYED_RELEASE_CAPSULE | Freq: Every day | ORAL | 1 refills | Status: DC

## 2024-01-17 MED ORDER — BENAZEPRIL HCL 5 MG PO TABS: 5.0000 mg | ORAL_TABLET | Freq: Two times a day (BID) | ORAL | 1 refills | Status: DC

## 2024-01-17 MED ORDER — CETIRIZINE HCL 10 MG PO TABS: 10.0000 mg | ORAL_TABLET | Freq: Every day | ORAL | 1 refills | Status: AC

## 2024-01-17 MED ORDER — ROSUVASTATIN CALCIUM 10 MG PO TABS: 10.0000 mg | ORAL_TABLET | Freq: Every day | ORAL | 1 refills | Status: DC

## 2024-01-17 NOTE — Assessment & Plan Note (Signed)
 Chronic.  Controlled.  Continue with current medication regimen.  In NSR on EKG on 7/9 at Cardiology.  Labs ordered today.  Return to clinic in 6 months for reevaluation.  Call sooner if concerns arise.  Follow by Cardiology- reviewed recent note.

## 2024-01-17 NOTE — Assessment & Plan Note (Signed)
 Chronic.  Controlled.  Crestor controlled at 10mg  daily. Refills sent today.  Labs ordered today.  Return to clinic in 6 months for reevaluation.  Call sooner if concerns arise.

## 2024-01-17 NOTE — Assessment & Plan Note (Signed)
 Chronic.  Controlled.  Continue with current medication regimen of Benzapril 5mg  BID.  Recommend checking blood pressures at home and bringing log to next visit. Refills sent today.  Labs ordered today.  Return to clinic in 6 months for reevaluation.  Call sooner if concerns arise.

## 2024-01-17 NOTE — Progress Notes (Signed)
 BP 124/77   Pulse 74   Temp 97.9 F (36.6 C) (Oral)   Ht 6' 3 (1.905 m)   Wt 252 lb (114.3 kg)   SpO2 95%   BMI 31.50 kg/m    Subjective:    Patient ID: Thomas Mercado, male    DOB: 11/11/1955, 68 y.o.   MRN: [id number]  HPI: Thomas Mercado is a 68 y.o. male presenting on 01/17/2024 for comprehensive medical examination. Current medical complaints include:acid reflux  He currently lives with: Interim Problems from his last visit: no  HYPERTENSION / HYPERLIPIDEMIA Satisfied with current treatment? yes Duration of hypertension: years BP monitoring frequency: sometimes BP range: 120-130/80 BP medication side effects: no Past BP meds: benazepril  Duration of hyperlipidemia: years Cholesterol medication side effects: no Cholesterol supplements: none Past cholesterol medications: rosuvastatin  (crestor ) Medication compliance: excellent compliance Aspirin : no Recent stressors: no Recurrent headaches: yes Visual changes: no Palpitations: no Dyspnea: no Chest pain: no Lower extremity edema: no Dizzy/lightheaded: no  SLEEP APNEA Using it nightly and feels like it is working well Sleep apnea status: controlled Duration: months Satisfied with current treatment?:  yes CPAP use:  yes Sleep quality with CPAP use: excellent Treament compliance:excellent compliance Last sleep study:  Treatments attempted:  Wakes feeling refreshed:  yes Daytime hypersomnolence:  no Fatigue:  no Insomnia:  no Good sleep hygiene:  no Difficulty falling asleep:  no Difficulty staying asleep:  no Snoring bothers bed partner:  no Observed apnea by bed partner: no Obesity:  no Hypertension: yes Pulmonary hypertension:  no Coronary artery disease:  no   Patient would like to be rested for alpha gal.  Dr. Tommas at [facility] is who he see's for alpha gal. Phone number: [phone].  He is eating what he wants.  Every once in awhile he has a problem but mostly doing fine.    Patient states that last  week he had a lot of epigastric pain.  Denies chest pain and arm pain.  It lasted for a full week then resolved.    Depression Screen done today and results listed below:     07/17/2023    8:41 AM 05/15/2023    8:20 AM 01/12/2023    8:31 AM 01/12/2023    8:27 AM 11/03/2022    8:11 AM  Depression screen PHQ 2/9  Decreased Interest 0 0 0 0 0  Down, Depressed, Hopeless 0 0 0 0 0  PHQ - 2 Score 0 0 0 0 0  Altered sleeping 1 0 2 0 0  Tired, decreased energy 0 0 1 0 0  Change in appetite 0 0 0 0 0  Feeling bad or failure about yourself  0 0 0 0 0  Trouble concentrating 0 0 0 0 0  Moving slowly or fidgety/restless 0 0 0 0 0  Suicidal thoughts 0 0 0 0 0  PHQ-9 Score 1 0 3 0 0  Difficult doing work/chores  Not difficult at all Not difficult at all Not difficult at all     The patient does not have a history of falls. I did complete a risk assessment for falls. A plan of care for falls was documented.   Past Medical History:  Past Medical History:  Diagnosis Date   Allergy    Aortic atherosclerosis (HCC) 07/31/2017   Chest x-ray February 2019   Arthritis    Atrial fibrillation [facility]   Atrial fibrillation (HCC)    CPAP (continuous positive airway  pressure) dependence    Dysrhythmia    ED (erectile dysfunction)    GERD (gastroesophageal reflux disease)    Heart murmur    Hyperlipidemia    Hypertension    Sleep apnea    Sleep apnea     Surgical History:  Past Surgical History:  Procedure Laterality Date   ABLATION  02/2013   heart   ABLATION  12/2015   heart   BASAL CELL CARCINOMA EXCISION Left 06/29/2022   left foot   COLONOSCOPY WITH PROPOFOL  N/A 10/23/2015   Procedure: COLONOSCOPY WITH PROPOFOL ;  Surgeon: Gladis RAYMOND Mariner, MD;  Location: [facility] ENDOSCOPY;  Service: Endoscopy;  Laterality: N/A;   KNEE ARTHROSCOPY Left 04/09/2015   Procedure: ,left knee arthroscopy, limited synovectomy and partial medial menisectomy.;  Surgeon: Franky Cranker, MD;  Location: [facility] ORS;  Service: Orthopedics;  Laterality: Left;   SHOULDER ARTHROSCOPY WITH OPEN ROTATOR CUFF REPAIR AND DISTAL CLAVICLE ACROMINECTOMY Left 05/20/2021   Procedure: SHOULDER ARTHROSCOPY WITH OPEN ROTATOR CUFF REPAIR AND DISTAL CLAVICLE ACROMINECTOMY;  Surgeon: Cranker Franky, MD;  Location: [facility] ORS;  Service: Orthopedics;  Laterality: Left;   VARICOCELE EXCISION      Medications:  Current Outpatient Medications on File Prior to Visit  Medication Sig   aspirin  EC 81 MG tablet Take 81 mg by mouth daily.   fluticasone  (FLONASE ) 50 MCG/ACT nasal spray Place 2 sprays into both nostrils daily. (Patient taking differently: Place 1 spray into both nostrils daily as needed for allergies.)   Multiple Vitamin (MULTIVITAMIN) tablet Take 1 tablet by mouth daily.   No current facility-administered medications on file prior to visit.    Allergies:  Allergies  Allergen Reactions   Alpha-Gal Diarrhea    Aches and pains  Other Reaction(s): Not available   Penicillin G Benzathine Rash    As a child    Social History:  Social History   Socioeconomic History   Marital status: Married    Spouse name: Rojelio   Number of children: 1   Years of education: Not on file   Highest education level: Associate degree: occupational, Scientist, product/process development, or vocational program  Occupational History   Occupation: full time    Employer: [employer]   Occupation: retired  Tobacco Use   Smoking status: Never   Smokeless tobacco: Never  Vaping Use   Vaping status: Never Used  Substance and Sexual Activity   Alcohol use: Yes    Alcohol/week: 1.0 standard drink of alcohol    Types: 1 Standard drinks or equivalent per week    Comment: 1 beer or cocktail once a week   Drug use: No   Sexual activity: Yes  Other Topics Concern   Not on file  Social History Narrative   Not on file   Social Drivers of Health   Financial Resource Strain: Low Risk  (01/13/2024)   Overall  Financial Resource Strain (CARDIA)    Difficulty of Paying Living Expenses: Not hard at all  Food Insecurity: No Food Insecurity (01/13/2024)   Hunger Vital Sign    Worried About Running Out of Food in the Last Year: Never true    Ran Out of Food in the Last Year: Never true  Transportation Needs: No Transportation Needs (01/13/2024)   PRAPARE - Administrator, Civil Service (Medical): No    Lack of Transportation (Non-Medical): No  Physical Activity: Inactive (01/13/2024)   Exercise Vital Sign    Days of Exercise per Week: 0 days    Minutes  of Exercise per Session: Not on file  Stress: No Stress Concern Present (01/13/2024)   [employer] of Occupational Health - Occupational Stress Questionnaire    Feeling of Stress: Only a little  Social Connections: Socially Integrated (01/13/2024)   Social Connection and Isolation Panel    Frequency of Communication with Friends and Family: More than three times a week    Frequency of Social Gatherings with Friends and Family: Twice a week    Attends Religious Services: 1 to 4 times per year    Active Member of [employer] or Organizations: Yes    Attends Engineer, structural: More than 4 times per year    Marital Status: Married  Catering manager Violence: Not At Risk (01/17/2024)   Humiliation, Afraid, Rape, and Kick questionnaire    Fear of Current or Ex-Partner: No    Emotionally Abused: No    Physically Abused: No    Sexually Abused: No   Social History   Tobacco Use  Smoking Status Never  Smokeless Tobacco Never   Social History   Substance and Sexual Activity  Alcohol Use Yes   Alcohol/week: 1.0 standard drink of alcohol   Types: 1 Standard drinks or equivalent per week   Comment: 1 beer or cocktail once a week    Family History:  Family History  Problem Relation Age of Onset   Breast cancer Mother    Cancer Mother    Alzheimer's disease Father    Hypertension Father    Arthritis Father    Heart disease  Brother    Emphysema Brother     Past medical history, surgical history, medications, allergies, family history and social history reviewed with patient today and changes made to appropriate areas of the chart.   Review of Systems  Eyes:  Negative for blurred vision and double vision.  Respiratory:  Negative for shortness of breath.   Cardiovascular:  Negative for chest pain, palpitations and leg swelling.  Gastrointestinal:  Positive for heartburn.  Neurological:  Negative for dizziness and headaches.   All other ROS negative except what is listed above and in the HPI.      Objective:    BP 124/77   Pulse 74   Temp 97.9 F (36.6 C) (Oral)   Ht 6' 3 (1.905 m)   Wt 252 lb (114.3 kg)   SpO2 95%   BMI 31.50 kg/m   Wt Readings from Last 3 Encounters:  01/17/24 252 lb (114.3 kg)  07/17/23 252 lb (114.3 kg)  05/15/23 252 lb (114.3 kg)    Physical Exam Vitals and nursing note reviewed.  Constitutional:      General: He is not in acute distress.    Appearance: Normal appearance. He is obese. He is not ill-appearing, toxic-appearing or diaphoretic.  HENT:     Head: Normocephalic.     Right Ear: Tympanic membrane, ear canal and external ear normal.     Left Ear: Tympanic membrane, ear canal and external ear normal.     Nose: Nose normal. No congestion or rhinorrhea.     Mouth/Throat:     Mouth: Mucous membranes are moist.  Eyes:     General:        Right eye: No discharge.        Left eye: No discharge.     Extraocular Movements: Extraocular movements intact.     Conjunctiva/sclera: Conjunctivae normal.     Pupils: Pupils are equal, round, and reactive to light.  Cardiovascular:  Rate and Rhythm: Normal rate and regular rhythm.     Heart sounds: No murmur heard. Pulmonary:     Effort: Pulmonary effort is normal. No respiratory distress.     Breath sounds: Normal breath sounds. No wheezing, rhonchi or rales.  Abdominal:     General: Abdomen is flat. Bowel sounds are  normal. There is no distension.     Palpations: Abdomen is soft.     Tenderness: There is no abdominal tenderness. There is no guarding.  Musculoskeletal:     Cervical back: Normal range of motion and neck supple.  Skin:    General: Skin is warm and dry.     Capillary Refill: Capillary refill takes less than 2 seconds.  Neurological:     General: No focal deficit present.     Mental Status: He is alert and oriented to person, place, and time.     Cranial Nerves: No cranial nerve deficit.     Motor: No weakness.     Deep Tendon Reflexes: Reflexes normal.  Psychiatric:        Mood and Affect: Mood normal.        Behavior: Behavior normal.        Thought Content: Thought content normal.        Judgment: Judgment normal.     Results for orders placed or performed in visit on 07/17/23  Comp Met (CMET)   Collection Time: 07/17/23  9:12 AM  Result Value Ref Range   Glucose 101 (H) 70 - 99 mg/dL   BUN 21 8 - 27 mg/dL   Creatinine, Ser 9.12 0.76 - 1.27 mg/dL   eGFR 94 >40 fO/fpw/8.26   BUN/Creatinine Ratio 24 10 - 24   Sodium 141 134 - 144 mmol/L   Potassium 4.4 3.5 - 5.2 mmol/L   Chloride 103 96 - 106 mmol/L   CO2 24 20 - 29 mmol/L   Calcium  9.7 8.6 - 10.2 mg/dL   Total Protein 7.0 6.0 - 8.5 g/dL   Albumin 4.6 3.9 - 4.9 g/dL   Globulin, Total 2.4 1.5 - 4.5 g/dL   Bilirubin Total 0.5 0.0 - 1.2 mg/dL   Alkaline Phosphatase 92 44 - 121 IU/L   AST 20 0 - 40 IU/L   ALT 30 0 - 44 IU/L  HgB A1c   Collection Time: 07/17/23  9:12 AM  Result Value Ref Range   Hgb A1c MFr Bld 5.8 (H) 4.8 - 5.6 %   Est. average glucose Bld gHb Est-mCnc 120 mg/dL  Lipid Profile   Collection Time: 07/17/23  9:12 AM  Result Value Ref Range   Cholesterol, Total 126 100 - 199 mg/dL   Triglycerides 890 0 - 149 mg/dL   HDL 41 >60 mg/dL   VLDL Cholesterol Cal 20 5 - 40 mg/dL   LDL Chol Calc (NIH) 65 0 - 99 mg/dL   Chol/HDL Ratio 3.1 0.0 - 5.0 ratio  Alpha-Gal Panel   Collection Time: 07/17/23  9:12 AM   Result Value Ref Range   Class Description Allergens Comment    IgE (Immunoglobulin E), Serum 54 6 - 495 IU/mL   Pork IgE 0.53 (A) Class I kU/L   Beef IgE 1.12 (A) Class II kU/L   Allergen Lamb IgE 0.20 (A) Class 0/I kU/L   O215-IgE Alpha-Gal 0.77 (A) Class II kU/L      Assessment & Plan:   Problem List Items Addressed This Visit       Cardiovascular and Mediastinum  Essential hypertension   Chronic.  Controlled.  Continue with current medication regimen of Benzapril 5mg  BID.  Recommend checking blood pressures at home and bringing log to next visit. Refills sent today.  Labs ordered today.  Return to clinic in 6 months for reevaluation.  Call sooner if concerns arise.       Relevant Medications   benazepril  (LOTENSIN ) 5 MG tablet   rosuvastatin  (CRESTOR ) 10 MG tablet   Aortic atherosclerosis (HCC)   Controlled.  Continue with ASA and Crestor  10mg  daily.  Refills sent today.   Follow up in 6 months. Call sooner if concerns arise.       Relevant Medications   benazepril  (LOTENSIN ) 5 MG tablet   rosuvastatin  (CRESTOR ) 10 MG tablet   Paroxysmal atrial fibrillation (HCC)   Chronic.  Controlled.  Continue with current medication regimen.  In NSR on EKG on 7/9 at Cardiology.  Labs ordered today.  Return to clinic in 6 months for reevaluation.  Call sooner if concerns arise.  Follow by Cardiology- reviewed recent note.       Relevant Medications   benazepril  (LOTENSIN ) 5 MG tablet   rosuvastatin  (CRESTOR ) 10 MG tablet   Other Relevant Orders   CBC With Diff/Platelet     Digestive   Acid reflux   Relevant Medications   omeprazole  (PRILOSEC) 40 MG capsule     Other   Hyperlipidemia   Chronic.  Controlled.  Crestor  controlled at 10mg  daily. Refills sent today.  Labs ordered today.  Return to clinic in 6 months for reevaluation.  Call sooner if concerns arise.       Relevant Medications   benazepril  (LOTENSIN ) 5 MG tablet   rosuvastatin  (CRESTOR ) 10 MG tablet   Other  Relevant Orders   Lipid panel   Prediabetes   Labs ordered at visit today.  Will make recommendations based on lab results.        Relevant Orders   Hemoglobin A1c   Other Visit Diagnoses       Annual physical exam    -  Primary   Health maintenance reviewed during visit today.  Labs ordered. Vaccines reviewed.  Colonoscopy up to date.   Relevant Orders   CBC With Diff/Platelet   Hemoglobin A1c   Lipid panel   Comprehensive metabolic panel with GFR   TSH   PSA        Discussed aspirin  prophylaxis for myocardial infarction prevention and decision was it was not indicated  LABORATORY TESTING:  Health maintenance labs ordered today as discussed above.   The natural history of prostate cancer and ongoing controversy regarding screening and potential treatment outcomes of prostate cancer has been discussed with the patient. The meaning of a false positive PSA and a false negative PSA has been discussed. He indicates understanding of the limitations of this screening test and wishes to proceed with screening PSA testing.   IMMUNIZATIONS:   - Tdap: Tetanus vaccination status reviewed: Medicare. - Influenza: Postponed to flu season - Pneumovax: Up to date - Prevnar: Up to date - COVID: Not applicable - HPV: Not applicable - Shingrix vaccine: Up to date  SCREENING: - Colonoscopy: Up to date  Discussed with patient purpose of the colonoscopy is to detect colon cancer at curable precancerous or early stages   - AAA Screening: Not applicable  -Hearing Test: Not applicable  -Spirometry: Not applicable   PATIENT COUNSELING:    Sexuality: Discussed sexually transmitted diseases, partner selection, use of condoms, avoidance of unintended  pregnancy  and contraceptive alternatives.   Advised to avoid cigarette smoking.  I discussed with the patient that most people either abstain from alcohol or drink within safe limits (<=14/week and <=4 drinks/occasion for males, <=7/weeks and  <= 3 drinks/occasion for females) and that the risk for alcohol disorders and other health effects rises proportionally with the number of drinks per week and how often a drinker exceeds daily limits.  Discussed cessation/primary prevention of drug use and availability of treatment for abuse.   Diet: Encouraged to adjust caloric intake to maintain  or achieve ideal body weight, to reduce intake of dietary saturated fat and total fat, to limit sodium intake by avoiding high sodium foods and not adding table salt, and to maintain adequate dietary potassium and calcium  preferably from fresh fruits, vegetables, and low-fat dairy products.    stressed the importance of regular exercise  Injury prevention: Discussed safety belts, safety helmets, smoke detector, smoking near bedding or upholstery.   Dental health: Discussed importance of regular tooth brushing, flossing, and dental visits.   Follow up plan: NEXT PREVENTATIVE PHYSICAL DUE IN 1 YEAR. Return in about 6 months (around 07/19/2024) for HTN, HLD, DM2 FU.

## 2024-01-17 NOTE — Assessment & Plan Note (Signed)
 Controlled.  Continue with ASA and Crestor  10mg  daily.  Refills sent today.   Follow up in 6 months. Call sooner if concerns arise.

## 2024-01-17 NOTE — Assessment & Plan Note (Signed)
 Labs ordered at visit today.  Will make recommendations based on lab results.

## 2024-01-18 ENCOUNTER — Ambulatory Visit: Payer: Self-pay | Admitting: Nurse Practitioner

## 2024-01-18 DIAGNOSIS — T781XXA Other adverse food reactions, not elsewhere classified, initial encounter: Secondary | ICD-10-CM

## 2024-01-18 LAB — CBC WITH DIFF/PLATELET
Basophils Absolute: 0 x10E3/uL (ref 0.0–0.2)
Basos: 1 %
EOS (ABSOLUTE): 0.2 x10E3/uL (ref 0.0–0.4)
Eos: 4 %
Hematocrit: 43.5 % (ref 37.5–51.0)
Hemoglobin: 14.1 g/dL (ref 13.0–17.7)
Immature Grans (Abs): 0 x10E3/uL (ref 0.0–0.1)
Immature Granulocytes: 0 %
Lymphocytes Absolute: 1.6 x10E3/uL (ref 0.7–3.1)
Lymphs: 34 %
MCH: 30.3 pg (ref 26.6–33.0)
MCHC: 32.4 g/dL (ref 31.5–35.7)
MCV: 93 fL (ref 79–97)
Monocytes Absolute: 0.5 x10E3/uL (ref 0.1–0.9)
Monocytes: 11 %
Neutrophils Absolute: 2.4 x10E3/uL (ref 1.4–7.0)
Neutrophils: 50 %
Platelets: 175 x10E3/uL (ref 150–450)
RBC: 4.66 x10E6/uL (ref 4.14–5.80)
RDW: 12.8 % (ref 11.6–15.4)
WBC: 4.8 x10E3/uL (ref 3.4–10.8)

## 2024-01-18 LAB — COMPREHENSIVE METABOLIC PANEL WITH GFR
ALT: 30 IU/L (ref 0–44)
AST: 22 IU/L (ref 0–40)
Albumin: 4.6 g/dL (ref 3.9–4.9)
Alkaline Phosphatase: 89 IU/L (ref 44–121)
BUN/Creatinine Ratio: 20 (ref 10–24)
BUN: 16 mg/dL (ref 8–27)
Bilirubin Total: 0.3 mg/dL (ref 0.0–1.2)
CO2: 21 mmol/L (ref 20–29)
Calcium: 9.7 mg/dL (ref 8.6–10.2)
Chloride: 103 mmol/L (ref 96–106)
Creatinine, Ser: 0.81 mg/dL (ref 0.76–1.27)
Globulin, Total: 2 g/dL (ref 1.5–4.5)
Glucose: 100 mg/dL — ABNORMAL HIGH (ref 70–99)
Potassium: 4.4 mmol/L (ref 3.5–5.2)
Sodium: 140 mmol/L (ref 134–144)
Total Protein: 6.6 g/dL (ref 6.0–8.5)
eGFR: 95 mL/min/1.73 (ref >59)

## 2024-01-18 LAB — LIPID PANEL
Chol/HDL Ratio: 3.1 ratio (ref 0.0–5.0)
Cholesterol, Total: 114 mg/dL (ref 100–199)
HDL: 37 mg/dL — ABNORMAL LOW (ref >39)
LDL Chol Calc (NIH): 57 mg/dL (ref 0–99)
Triglycerides: 107 mg/dL (ref 0–149)
VLDL Cholesterol Cal: 20 mg/dL (ref 5–40)

## 2024-01-18 LAB — HEMOGLOBIN A1C
Est. average glucose Bld gHb Est-mCnc: 120 mg/dL
Hgb A1c MFr Bld: 5.8 % — ABNORMAL HIGH (ref 4.8–5.6)

## 2024-01-18 LAB — TSH: TSH: 1.11 u[IU]/mL (ref 0.450–4.500)

## 2024-01-18 LAB — PSA: Prostate Specific Ag, Serum: 1.8 ng/mL (ref 0.0–4.0)

## 2024-04-15 DIAGNOSIS — Z872 Personal history of diseases of the skin and subcutaneous tissue: Secondary | ICD-10-CM | POA: Diagnosis not present

## 2024-04-15 DIAGNOSIS — Z85828 Personal history of other malignant neoplasm of skin: Secondary | ICD-10-CM | POA: Diagnosis not present

## 2024-04-15 DIAGNOSIS — Z859 Personal history of malignant neoplasm, unspecified: Secondary | ICD-10-CM | POA: Diagnosis not present

## 2024-04-15 DIAGNOSIS — L570 Actinic keratosis: Secondary | ICD-10-CM | POA: Diagnosis not present

## 2024-04-15 DIAGNOSIS — L578 Other skin changes due to chronic exposure to nonionizing radiation: Secondary | ICD-10-CM | POA: Diagnosis not present

## 2024-04-15 DIAGNOSIS — Z86018 Personal history of other benign neoplasm: Secondary | ICD-10-CM | POA: Diagnosis not present

## 2024-05-19 NOTE — Progress Notes (Signed)
 [facility] [address] [place], [place] [address]  Pulmonary Sleep Medicine   Office Visit Note  Patient Name: Thomas Mercado DOB: [birth year] MRN [id number]    Chief Complaint: Obstructive Sleep Apnea visit  Brief History:  Thomas Mercado is seen today for an annual follow up visit for CPAP@ 7 cmH2O. The patient has a 15 year history of sleep apnea. Patient is using PAP nightly.  The patient feels rested after sleeping with PAP.  The patient reports benefiting from PAP use. Reported sleepiness is  improved and the Epworth Sleepiness Score is 8 out of 24. The patient does not take naps. The patient complains of the following: . The compliance download shows 95% compliance with an average use time of 7 hours 45 minutes. The AHI is 1.4.  The patient does complain of limb movements disrupting sleep. Machine is at end of motor life, out of warranty and requires replacement to ensure consistent therapy.  The patient continues to require PAP therapy in order to eliminate sleep apnea.   ROS  General: (-) fever, (-) chills, (-) night sweat Nose and Sinuses: (-) nasal stuffiness or itchiness, (-) postnasal drip, (-) nosebleeds, (-) sinus trouble. Mouth and Throat: (-) sore throat, (-) hoarseness. Neck: (-) swollen glands, (-) enlarged thyroid, (-) neck pain. Respiratory: - cough, - shortness of breath, - wheezing. Neurologic: - numbness, - tingling. Psychiatric: - anxiety, - depression   Current Medication: Outpatient Encounter Medications as of 05/20/2024  Medication Sig   aspirin  EC 81 MG tablet Take 81 mg by mouth daily.   benazepril  (LOTENSIN ) 5 MG tablet Take 1 tablet (5 mg total) by mouth 2 (two) times daily.   cetirizine  (ZYRTEC ) 10 MG tablet Take 1 tablet (10 mg total) by mouth daily.   fluticasone  (FLONASE ) 50 MCG/ACT nasal spray Place 2 sprays into both nostrils daily. (Patient taking differently: Place 1 spray into both nostrils daily as needed for allergies.)   Multiple  Vitamin (MULTIVITAMIN) tablet Take 1 tablet by mouth daily.   omeprazole  (PRILOSEC) 40 MG capsule Take 1 capsule (40 mg total) by mouth daily.   rosuvastatin  (CRESTOR ) 10 MG tablet Take 1 tablet (10 mg total) by mouth daily.   No facility-administered encounter medications on file as of 05/20/2024.    Surgical History: Past Surgical History:  Procedure Laterality Date   ABLATION  02/2013   heart   ABLATION  12/2015   heart   BASAL CELL CARCINOMA EXCISION Left 06/29/2022   left foot   COLONOSCOPY WITH PROPOFOL  N/A 10/23/2015   Procedure: COLONOSCOPY WITH PROPOFOL ;  Surgeon: Thomas RAYMOND Mariner, MD;  Location: [facility];  Service: Endoscopy;  Laterality: N/A;   KNEE ARTHROSCOPY Left 04/09/2015   Procedure: ,left knee arthroscopy, limited synovectomy and partial medial menisectomy.;  Surgeon: Franky Cranker, MD;  Location: [facility];  Service: Orthopedics;  Laterality: Left;   SHOULDER ARTHROSCOPY WITH OPEN ROTATOR CUFF REPAIR AND DISTAL CLAVICLE ACROMINECTOMY Left 05/20/2021   Procedure: SHOULDER ARTHROSCOPY WITH OPEN ROTATOR CUFF REPAIR AND DISTAL CLAVICLE ACROMINECTOMY;  Surgeon: Cranker Franky, MD;  Location: [facility];  Service: Orthopedics;  Laterality: Left;   VARICOCELE EXCISION      Medical History: Past Medical History:  Diagnosis Date   Allergy    Aortic atherosclerosis 07/31/2017   Chest x-ray February 2019   Arthritis    Atrial fibrillation [facility]   Atrial fibrillation (HCC)    CPAP (continuous positive airway pressure) dependence    Dysrhythmia  ED (erectile dysfunction)    GERD (gastroesophageal reflux disease)    Heart murmur    Hyperlipidemia    Hypertension    Sleep apnea    Sleep apnea     Family History: Non contributory to the present illness  Social History: Social History   Socioeconomic History   Marital status: Married    Spouse name: Thomas Mercado   Number of children: 1   Years of education: Not on  file   Highest education level: Associate degree: occupational, scientist, product/process development, or vocational program  Occupational History   Occupation: full time    Employer: [employer]   Occupation: retired  Tobacco Use   Smoking status: Never   Smokeless tobacco: Never  Vaping Use   Vaping status: Never Used  Substance and Sexual Activity   Alcohol use: Yes    Alcohol/week: 1.0 standard drink of alcohol    Types: 1 Standard drinks or equivalent per week    Comment: 1 beer or cocktail once a week   Drug use: No   Sexual activity: Yes  Other Topics Concern   Not on file  Social History Narrative   Not on file   Social Drivers of Health   Financial Resource Strain: Low Risk  (05/17/2024)   Overall Financial Resource Strain (CARDIA)    Difficulty of Paying Living Expenses: Not hard at all  Food Insecurity: No Food Insecurity (05/17/2024)   Hunger Vital Sign    Worried About Running Out of Food in the Last Year: Never true    Ran Out of Food in the Last Year: Never true  Transportation Needs: No Transportation Needs (05/17/2024)   PRAPARE - Administrator, Civil Service (Medical): No    Lack of Transportation (Non-Medical): No  Physical Activity: Unknown (05/17/2024)   Exercise Vital Sign    Days of Exercise per Week: Patient declined    Minutes of Exercise per Session: Not on file  Stress: Stress Concern Present (05/17/2024)   [facility] [employer] - Occupational Stress Questionnaire    Feeling of Stress: To some extent  Social Connections: Socially Integrated (05/17/2024)   Social Connection and Isolation Panel    Frequency of Communication with Friends and Family: More than three times a week    Frequency of Social Gatherings with Friends and Family: More than three times a week    Attends Religious Services: 1 to 4 times per year    Active Member of [employer] or Organizations: Yes    Attends Engineer, Structural: More than 4 times per year     Marital Status: Married  Catering Manager Violence: Not At Risk (01/17/2024)   Humiliation, Afraid, Rape, and Kick questionnaire    Fear of Current or Ex-Partner: No    Emotionally Abused: No    Physically Abused: No    Sexually Abused: No    Vital Signs: Blood pressure 123/74, pulse 84, resp. rate 16, height 6' 3 (1.905 m), weight 254 lb (115.2 kg), SpO2 97%. Body mass index is 31.75 kg/m.    Examination: General Appearance: The patient is well-developed, well-nourished, and in no distress. Neck Circumference: 41 cm Skin: Gross inspection of skin unremarkable. Head: normocephalic, no gross deformities. Eyes: no gross deformities noted. ENT: ears appear grossly normal Neurologic: Alert and oriented. No involuntary movements.  STOP BANG RISK ASSESSMENT S (snore) Have you been told that you snore?     NO   T (tired) Are you often tired, fatigued, or  sleepy during the day?   YES  O (obstruction) Do you stop breathing, choke, or gasp during sleep? NO   P (pressure) Do you have or are you being treated for high blood pressure? YES   B (BMI) Is your body index greater than 35 kg/m? NO   A (age) Are you 41 years old or older? YES   N (neck) Do you have a neck circumference greater than 16 inches?   YES   G (gender) Are you a male? YES   TOTAL STOP/BANG "YES" ANSWERS 5       A STOP-Bang score of 2 or less is considered low risk, and a score of 5 or more is high risk for having either moderate or severe OSA. For people who score 3 or 4, doctors may need to perform further assessment to determine how likely they are to have OSA.         EPWORTH SLEEPINESS SCALE:  Scale:  (0)= no chance of dozing; (1)= slight chance of dozing; (2)= moderate chance of dozing; (3)= high chance of dozing  Chance  Situtation    Sitting and reading: 2    Watching TV: 2    Sitting Inactive in public: 0    As a passenger in car: 3      Lying down to rest: 0    Sitting and talking: 0     Sitting quielty after lunch: 1    In a car, stopped in traffic: 0   TOTAL SCORE:   8 out of 24    SLEEP STUDIES:  PSG (12/2008) AHI 10/hr, Supine AHI 22/hr, RERA 27/hr, Supine RERA 56/hr, min SpO2 85%   CPAP COMPLIANCE DATA:  Date Range: 05/16/2023- 11/25-2025  Average Daily Use: 7 hours 45 minutes  Median Use: 7 hours 58 minutes  Compliance for > 4 Hours: 95%  AHI: 1.4 respiratory events per hour  Days Used: 357/365 days  Mask Leak: 12.8  95th Percentile Pressure: 7         LABS: No results found for this or any previous visit (from the past 2160 hours).  Radiology: US  Abdomen Limited RUQ (LIVER/GB) Result Date: 09/05/2022 CLINICAL DATA:  Probable gallbladder polyp on prior CT EXAM: ULTRASOUND ABDOMEN LIMITED RIGHT UPPER QUADRANT COMPARISON:  CT abdomen pelvis 08/18/2022 FINDINGS: Gallbladder: Note is made of a 7 mm gallbladder polyp. No gallbladder wall thickening or pericholecystic fluid. Negative sonographic Murphy's sign. Common bile duct: Diameter: 4 mm Liver: Increased echogenicity. No focal lesion. Portal vein is patent on color Doppler imaging with normal direction of blood flow towards the liver. Other: None. IMPRESSION: 1. There is a 7 mm gallbladder polyp. Recommend follow-up ultrasound in 1 year. 2. Increased hepatic parenchymal echogenicity suggestive of steatosis. Electronically Signed   By: Bard Moats M.D.   On: 09/05/2022 10:31    No results found.  No results found.    Assessment and Plan: Patient Active Problem List   Diagnosis Date Noted   Prediabetes 07/17/2023   Acute cystitis with hematuria 08/02/2022   Allergic reaction to alpha-gal 02/15/2021   OSA on CPAP 01/05/2021   CPAP use counseling 01/05/2021   Obesity (BMI 30-39.9) 01/05/2021   Paroxysmal atrial fibrillation (HCC) 01/05/2021   Pneumatosis of intestines 06/05/2020   Trigger finger of right hand 11/28/2017   Aortic atherosclerosis 07/31/2017   Solitary pulmonary  nodule 07/31/2017   Essential hypertension 03/28/2017   Allergic rhinitis 03/10/2015   Hypogonadism in male 03/10/2015   Non-alcoholic fatty liver  disease 03/10/2015   Obstructive apnea 03/12/2013   Acid reflux 02/07/2013   Hyperlipidemia 02/07/2013    1. OSA on CPAP (Primary) The patient does tolerate PAP and reports  benefit from PAP use. Machine is past end of life and needs to be replaced. He complains of feeling like he's not getting enough air so we will increase his pressure to 8 cm. The patient was reminded how to clean equipment and advised to replace supplies routinely. The patient was also counselled on weight loss. The compliance is excellent. The AHI is 1.4.   OSA- replace machine. Increase pressure to 8 cm for comfort. F/u after setup  2. CPAP use counseling CPAP Counseling: had a lengthy discussion with the patient regarding the importance of PAP therapy in management of the sleep apnea. Patient appears to understand the risk factor reduction and also understands the risks associated with untreated sleep apnea. Patient will try to make a good faith effort to remain compliant with therapy. Also instructed the patient on proper cleaning of the device including the water must be changed daily if possible and use of distilled water is preferred. Patient understands that the machine should be regularly cleaned with appropriate recommended cleaning solutions that do not damage the PAP machine for example given white vinegar and water rinses. Other methods such as ozone treatment may not be as good as these simple methods to achieve cleaning.   3. Essential hypertension Controlled with benazepril , continue.     General Counseling: I have discussed the findings of the evaluation and examination with [place].  I have also discussed any further diagnostic evaluation thatmay be needed or ordered today. Deldrick verbalizes understanding of the findings of todays visit. We also reviewed his  medications today and discussed drug interactions and side effects including but not limited excessive drowsiness and altered mental states. We also discussed that there is always a risk not just to him but also people around him. he has been encouraged to call the office with any questions or concerns that should arise related to todays visit.  No orders of the defined types were placed in this encounter.       I have personally obtained a history, examined the patient, evaluated laboratory and imaging results, formulated the assessment and plan and placed orders. This patient was seen today by Lauraine Lay, PA-C in collaboration with Dr. Elfreda Bathe.   Elfreda DELENA Bathe, MD [facility] Diplomate ABMS Pulmonary Critical Care Medicine and Sleep Medicine

## 2024-05-20 ENCOUNTER — Ambulatory Visit: Admitting: Internal Medicine

## 2024-05-20 VITALS — BP 123/74 | HR 84 | Resp 16 | Ht 75.0 in | Wt 254.0 lb

## 2024-05-20 DIAGNOSIS — I10 Essential (primary) hypertension: Secondary | ICD-10-CM | POA: Diagnosis not present

## 2024-05-20 DIAGNOSIS — G4733 Obstructive sleep apnea (adult) (pediatric): Secondary | ICD-10-CM | POA: Diagnosis not present

## 2024-05-20 DIAGNOSIS — Z7189 Other specified counseling: Secondary | ICD-10-CM

## 2024-05-20 NOTE — Patient Instructions (Signed)

## 2024-05-21 ENCOUNTER — Telehealth: Payer: Self-pay | Admitting: Nurse Practitioner

## 2024-05-21 NOTE — Telephone Encounter (Signed)
 Copied from CRM #[id number]. Topic: Medicare AWV >> May 21, 2024  7:47 AM Nathanel DEL wrote: LVM 05/21/24 at 7:44am to r/s AWV due to [facility] out sick. Please [email or web address]  Nathanel Paschal; Care Guide Ambulatory Clinical Support [place] [facility] Direct Dial: [phone]

## 2024-05-23 ENCOUNTER — Ambulatory Visit

## 2024-05-23 VITALS — Ht 75.0 in | Wt 245.0 lb

## 2024-05-23 DIAGNOSIS — Z0000 Encounter for general adult medical examination without abnormal findings: Secondary | ICD-10-CM | POA: Diagnosis not present

## 2024-05-23 NOTE — Patient Instructions (Signed)
 Mr. Martensen,  Thank you for taking the time for your Medicare Wellness Visit. I appreciate your continued commitment to your health goals. Please review the care plan we discussed, and feel free to reach out if I can assist you further.  Please note that Annual Wellness Visits do not include a physical exam. Some assessments may be limited, especially if the visit was conducted virtually. If needed, we may recommend an in-person follow-up with your provider.  Ongoing Care Seeing your primary care provider every 3 to 6 months helps us  monitor your health and provide consistent, personalized care.   Referrals If a referral was made during today's visit and you haven't received any updates within two weeks, please contact the referred provider directly to check on the status.  Recommended Screenings:  You may get a flu vaccine at your doctors office or at your local pharmacy at your convenience. You may get a tetanus vaccine at your local pharmacy at your convenience. Schedule an eye exam with the provider of your choice. You should get a routine eye exam every 1-2 years.   Health Maintenance  Topic Date Due   DTaP/Tdap/Td vaccine (2 - Td or Tdap) 07/05/2023   Flu Shot  01/19/2024   COVID-19 Vaccine (5 - 2025-26 season) 02/19/2024   Medicare Annual Wellness Visit  05/14/2024   Colon Cancer Screening  09/30/2030   Pneumococcal Vaccine for age over 41  Completed   Hepatitis C Screening  Completed   Zoster (Shingles) Vaccine  Completed   Meningitis B Vaccine  Aged Out       05/23/2024    9:32 AM  Advanced Directives  Does Patient Have a Medical Advance Directive? Yes  Type of Estate Agent of [place];Living will  Does patient want to make changes to medical advance directive? No - Patient declined  Copy of Healthcare Power of Attorney in Chart? No - copy requested    Vision: Annual vision screenings are recommended for early detection of glaucoma, cataracts, and  diabetic retinopathy. These exams can also reveal signs of chronic conditions such as diabetes and high blood pressure.  Dental: Annual dental screenings help detect early signs of oral cancer, gum disease, and other conditions linked to overall health, including heart disease and diabetes.  Please see the attached documents for additional preventive care recommendations.

## 2024-05-23 NOTE — Progress Notes (Signed)
 Chief Complaint  Patient presents with   Medicare Wellness     Subjective:   Thomas Mercado is a 68 y.o. male who presents for a Medicare Annual Wellness Visit.  Visit info / Clinical Intake: Medicare Wellness Visit Type:: Subsequent Annual Wellness Visit Persons participating in visit and providing information:: patient Medicare Wellness Visit Mode:: Telephone If telephone:: video declined Since this visit was completed virtually, some vitals may be partially provided or unavailable. Missing vitals are due to the limitations of the virtual format.: Documented vitals are patient reported If Telephone or Video please confirm:: I connected with patient using audio/video enable telemedicine. I verified patient identity with two identifiers, discussed telehealth limitations, and patient agreed to proceed. Patient Location:: home Provider Location:: home Interpreter Needed?: No Pre-visit prep was completed: yes AWV questionnaire completed by patient prior to visit?: yes Date:: 05/22/24 Living arrangements:: lives with spouse/significant other Patient's Overall Health Status Rating: good Typical amount of pain: some Does pain affect daily life?: no Are you currently prescribed opioids?: no  Dietary Habits and Nutritional Risks How many meals a day?: 2 Eats fruit and vegetables daily?: (!) no Most meals are obtained by: preparing own meals In the last 2 weeks, have you had any of the following?: none Diabetic:: no  Functional Status Activities of Daily Living (to include ambulation/medication): Independent Ambulation: Independent with device- listed below Home Assistive Devices/Equipment: Eyeglasses; CPAP Medication Administration: Independent Home Management (perform basic housework or laundry): Independent Manage your own finances?: yes Primary transportation is: driving Concerns about vision?: no *vision screening is required for WTM* Concerns about hearing?: no  Fall  Screening Falls in the past year?: 0 Number of falls in past year: 0 Was there an injury with Fall?: 0 Fall Risk Category Calculator: 0 Patient Fall Risk Level: Low Fall Risk  Fall Risk Patient at Risk for Falls Due to: No Fall Risks Fall risk Follow up: Falls evaluation completed  Home and Transportation Safety: All rugs have non-skid backing?: yes All stairs or steps have railings?: N/A, no stairs Grab bars in the bathtub or shower?: yes Have non-skid surface in bathtub or shower?: yes Good home lighting?: yes Regular seat belt use?: yes Hospital stays in the last year:: no  Cognitive Assessment Difficulty concentrating, remembering, or making decisions? : no Will 6CIT or Mini Cog be Completed: yes What year is it?: 0 points What month is it?: 0 points Give patient an address phrase to remember (5 components): [address] [place] About what time is it?: 0 points Count backwards from 20 to 1: 0 points Say the months of the year in reverse: 2 points (missed Sept) Repeat the address phrase from earlier: 0 points 6 CIT Score: 2 points  Advance Directives (For Healthcare) Does Patient Have a Medical Advance Directive?: Yes Does patient want to make changes to medical advance directive?: No - Patient declined Type of Advance Directive: Healthcare Power of [place]; Living will Copy of Healthcare Power of Attorney in Chart?: No - copy requested Copy of Living Will in Chart?: No - copy requested  Reviewed/Updated  Reviewed/Updated: Reviewed All (Medical, Surgical, Family, Medications, Allergies, Care Teams, Patient Goals)    Allergies (verified) Alpha-gal and Penicillin g benzathine   Current Medications (verified) Outpatient Encounter Medications as of 05/23/2024  Medication Sig   aspirin  EC 81 MG tablet Take 81 mg by mouth daily.   benazepril  (LOTENSIN ) 5 MG tablet Take 1 tablet (5 mg total) by mouth 2 (two) times daily.  cetirizine  (ZYRTEC ) 10 MG tablet Take 1  tablet (10 mg total) by mouth daily.   fluticasone  (FLONASE ) 50 MCG/ACT nasal spray Place 2 sprays into both nostrils daily. (Patient taking differently: Place 2 sprays into both nostrils daily. PRN)   Multiple Vitamin (MULTIVITAMIN) tablet Take 1 tablet by mouth daily.   omeprazole  (PRILOSEC) 40 MG capsule Take 1 capsule (40 mg total) by mouth daily.   rosuvastatin  (CRESTOR ) 10 MG tablet Take 1 tablet (10 mg total) by mouth daily.   No facility-administered encounter medications on file as of 05/23/2024.    History: Past Medical History:  Diagnosis Date   Allergy    Aortic atherosclerosis 07/31/2017   Chest x-ray February 2019   Arthritis    Atrial fibrillation [facility]   Atrial fibrillation (HCC)    CPAP (continuous positive airway pressure) dependence    Dysrhythmia    ED (erectile dysfunction)    GERD (gastroesophageal reflux disease)    Heart murmur    Hyperlipidemia    Hypertension    Sleep apnea    Sleep apnea    Past Surgical History:  Procedure Laterality Date   ABLATION  02/2013   heart   ABLATION  12/2015   heart   BASAL CELL CARCINOMA EXCISION Left 06/29/2022   left foot   COLONOSCOPY WITH PROPOFOL  N/A 10/23/2015   Procedure: COLONOSCOPY WITH PROPOFOL ;  Surgeon: Gladis RAYMOND Mariner, MD;  Location: [facility] ENDOSCOPY;  Service: Endoscopy;  Laterality: N/A;   KNEE ARTHROSCOPY Left 04/09/2015   Procedure: ,left knee arthroscopy, limited synovectomy and partial medial menisectomy.;  Surgeon: Franky Cranker, MD;  Location: [facility] ORS;  Service: Orthopedics;  Laterality: Left;   SHOULDER ARTHROSCOPY WITH OPEN ROTATOR CUFF REPAIR AND DISTAL CLAVICLE ACROMINECTOMY Left 05/20/2021   Procedure: SHOULDER ARTHROSCOPY WITH OPEN ROTATOR CUFF REPAIR AND DISTAL CLAVICLE ACROMINECTOMY;  Surgeon: Cranker Franky, MD;  Location: [facility] ORS;  Service: Orthopedics;  Laterality: Left;   VARICOCELE EXCISION     Family History  Problem Relation Age of  Onset   Breast cancer Mother    Cancer Mother    Alzheimer's disease Father    Hypertension Father    Arthritis Father    Heart disease Brother    Emphysema Brother    Social History   Occupational History   Occupation: full time    Employer: [employer]   Occupation: retired  Tobacco Use   Smoking status: Never   Smokeless tobacco: Never  Vaping Use   Vaping status: Never Used  Substance and Sexual Activity   Alcohol use: Yes    Alcohol/week: 1.0 standard drink of alcohol    Types: 1 Standard drinks or equivalent per week    Comment: 1 beer or cocktail once a week   Drug use: Never   Sexual activity: Yes   Tobacco Counseling Counseling given: Not Answered  SDOH Screenings   Food Insecurity: No Food Insecurity (05/23/2024)  Housing: Low Risk  (05/23/2024)  Transportation Needs: No Transportation Needs (05/23/2024)  Utilities: Not At Risk (05/23/2024)  Alcohol Screen: Low Risk  (05/17/2024)  Depression (PHQ2-9): Low Risk  (05/23/2024)  Financial Resource Strain: Low Risk  (05/17/2024)  Physical Activity: Inactive (05/23/2024)  Social Connections: Socially Integrated (05/23/2024)  Stress: No Stress Concern Present (05/23/2024)  Recent Concern: Stress - Stress Concern Present (05/17/2024)  Tobacco Use: Low Risk  (05/23/2024)  Health Literacy: Adequate Health Literacy (05/23/2024)   See flowsheets for full screening details  Depression Screen  PHQ 2 & 9 Depression Scale- Over the past 2 weeks, how often have you been bothered by any of the following problems? Little interest or pleasure in doing things: 0 Feeling down, depressed, or hopeless (PHQ Adolescent also includes...irritable): 0 PHQ-2 Total Score: 0 Trouble falling or staying asleep, or sleeping too much: 1 Feeling tired or having little energy: 1 Poor appetite or overeating (PHQ Adolescent also includes...weight loss): 0 Feeling bad about yourself - or that you are a failure or have let yourself or your family  down: 0 Trouble concentrating on things, such as reading the newspaper or watching television (PHQ Adolescent also includes...like school work): 0 Moving or speaking so slowly that other people could have noticed. Or the opposite - being so fidgety or restless that you have been moving around a lot more than usual: 0 Thoughts that you would be better off dead, or of hurting yourself in some way: 0 PHQ-9 Total Score: 2 If you checked off any problems, how difficult have these problems made it for you to do your work, take care of things at home, or get along with other people?: Not difficult at all  Depression Treatment Depression Interventions/Treatment : [id number] Score <4 Follow-up Not Indicated     Goals Addressed               This Visit's Progress     Weight (lb) < 230 lb (104.3 kg) (pt-stated)   245 lb (111.1 kg)            Objective:    Today's Vitals   05/23/24 0929  Weight: 245 lb (111.1 kg)  Height: 6' 3 (1.905 m)   Body mass index is 30.62 kg/m.  Hearing/Vision screen Hearing Screening - Comments:: Denies hearing loss  Vision Screening - Comments:: Needs routine eye exam. Patient to call and schedule. Immunizations and Health Maintenance Health Maintenance  Topic Date Due   DTaP/Tdap/Td (2 - Td or Tdap) 07/05/2023   Influenza Vaccine  01/19/2024   COVID-19 Vaccine (5 - 2025-26 season) 02/19/2024   Medicare Annual Wellness (AWV)  05/23/2025   Colonoscopy  09/30/2030   Pneumococcal Vaccine: 50+ Years  Completed   Hepatitis C Screening  Completed   Zoster Vaccines- Shingrix  Completed   Meningococcal B Vaccine  Aged Out        Assessment/Plan:  This is a routine wellness examination for Susana.  Patient Care Team: Melvin Pao, NP as PCP - General Fernand Elfreda LABOR, MD as Consulting Physician (Pulmonary Disease) Florencio Cara BIRCH, MD as Consulting Physician (Cardiology) Cathlyn Seal, MD as Referring Physician (Dermatology) Marchia Drivers, MD as Consulting Physician (Orthopedic Surgery) Tobie Drivers SQUIBB, DPM as Consulting Physician (Podiatry)  I have personally reviewed and noted the following in the patient's chart:   Medical and social history Use of alcohol, tobacco or illicit drugs  Current medications and supplements including opioid prescriptions. Functional ability and status Nutritional status Physical activity Advanced directives List of other physicians Hospitalizations, surgeries, and ER visits in previous 12 months Vitals Screenings to include cognitive, depression, and falls Referrals and appointments  No orders of the defined types were placed in this encounter.  In addition, I have reviewed and discussed with patient certain preventive protocols, quality metrics, and best practice recommendations. A written personalized care plan for preventive services as well as general preventive health recommendations were provided to patient.   Vina Ned, CMA   05/23/2024   Return in 1 year (on 05/27/2025).  After  Visit Summary: (MyChart) Due to this being a telephonic visit, the after visit summary with patients personalized plan was offered to patient via MyChart   Nurse Notes:  6 CIT Score - 2 Needs routine eye exam. Patient to call and schedule Plans to get the flu and Tdap vaccines Declined Covid vaccine

## 2024-07-15 ENCOUNTER — Other Ambulatory Visit: Payer: Self-pay | Admitting: Medical Genetics

## 2024-07-17 ENCOUNTER — Other Ambulatory Visit: Payer: Self-pay | Admitting: Nurse Practitioner

## 2024-07-17 DIAGNOSIS — K219 Gastro-esophageal reflux disease without esophagitis: Secondary | ICD-10-CM

## 2024-07-18 NOTE — Telephone Encounter (Signed)
 Requested Prescriptions  Pending Prescriptions Disp Refills   omeprazole  (PRILOSEC) 40 MG capsule [Pharmacy Med Name: OMEPRAZOLE  DR 40 MG CAPSULE] 90 capsule 1    Sig: TAKE 1 CAPSULE BY MOUTH DAILY     Gastroenterology: Proton Pump Inhibitors Passed - 07/18/2024 10:31 AM      Passed - Valid encounter within last 12 months    Recent Outpatient Visits           6 months ago Annual physical exam   [facility] Melvin Pao, NP

## 2024-07-22 ENCOUNTER — Ambulatory Visit: Admitting: Nurse Practitioner

## 2024-07-23 ENCOUNTER — Ambulatory Visit: Admitting: Nurse Practitioner

## 2024-07-23 ENCOUNTER — Encounter: Payer: Self-pay | Admitting: Nurse Practitioner

## 2024-07-23 VITALS — BP 109/66 | HR 83 | Temp 98.3°F | Resp 15 | Ht 75.0 in | Wt 255.2 lb

## 2024-07-23 DIAGNOSIS — T148XXA Other injury of unspecified body region, initial encounter: Secondary | ICD-10-CM

## 2024-07-23 DIAGNOSIS — I700 Atherosclerosis of aorta: Secondary | ICD-10-CM

## 2024-07-23 DIAGNOSIS — Z23 Encounter for immunization: Secondary | ICD-10-CM

## 2024-07-23 DIAGNOSIS — E785 Hyperlipidemia, unspecified: Secondary | ICD-10-CM

## 2024-07-23 DIAGNOSIS — I10 Essential (primary) hypertension: Secondary | ICD-10-CM

## 2024-07-23 DIAGNOSIS — T7819XA Other adverse food reactions, not elsewhere classified, initial encounter: Secondary | ICD-10-CM

## 2024-07-23 DIAGNOSIS — R7303 Prediabetes: Secondary | ICD-10-CM

## 2024-07-23 DIAGNOSIS — K219 Gastro-esophageal reflux disease without esophagitis: Secondary | ICD-10-CM

## 2024-07-23 DIAGNOSIS — I480 Paroxysmal atrial fibrillation: Secondary | ICD-10-CM

## 2024-07-23 DIAGNOSIS — G4733 Obstructive sleep apnea (adult) (pediatric): Secondary | ICD-10-CM

## 2024-07-23 MED ORDER — OMEPRAZOLE 40 MG PO CPDR: 40.0000 mg | DELAYED_RELEASE_CAPSULE | Freq: Every day | ORAL | 1 refills | Status: AC

## 2024-07-23 MED ORDER — BENAZEPRIL HCL 5 MG PO TABS: 5.0000 mg | ORAL_TABLET | Freq: Two times a day (BID) | ORAL | 1 refills | Status: AC

## 2024-07-23 MED ORDER — ROSUVASTATIN CALCIUM 10 MG PO TABS: 10.0000 mg | ORAL_TABLET | Freq: Every day | ORAL | 1 refills | Status: AC

## 2024-07-23 NOTE — Assessment & Plan Note (Signed)
 Chronic.  Controlled.  Crestor controlled at 10mg  daily. Refills sent today.  Labs ordered today.  Return to clinic in 6 months for reevaluation.  Call sooner if concerns arise.

## 2024-07-23 NOTE — Assessment & Plan Note (Signed)
 Chronic.  5.8% 6 months ago. Labs ordered at visit today.  Will make recommendations based on lab results.

## 2024-07-23 NOTE — Assessment & Plan Note (Signed)
 Labs checked during visit.  He is currently able to eat just about whatever he wants without symptoms.  Will make recommendations based on results.

## 2024-07-23 NOTE — Assessment & Plan Note (Signed)
 Controlled.  Continue with ASA and Crestor  10mg  daily.  Refills sent today.   Follow up in 6 months. Call sooner if concerns arise.

## 2024-07-23 NOTE — Assessment & Plan Note (Signed)
 Chronic.  Controlled.  Continue with current medication regimen of Benzapril 5mg  BID.  Refills sent today.  Labs ordered today.  Return to clinic in 6 months for reevaluation.  Call sooner if concerns arise.

## 2024-07-24 ENCOUNTER — Other Ambulatory Visit: Payer: Self-pay | Admitting: Medical Genetics

## 2024-07-24 DIAGNOSIS — Z006 Encounter for examination for normal comparison and control in clinical research program: Secondary | ICD-10-CM

## 2024-07-24 LAB — LIPID PANEL

## 2024-07-25 ENCOUNTER — Ambulatory Visit: Payer: Self-pay | Admitting: Nurse Practitioner

## 2024-07-25 LAB — COMPREHENSIVE METABOLIC PANEL WITH GFR
ALT: 26 [IU]/L (ref 0–44)
AST: 21 [IU]/L (ref 0–40)
Albumin: 4.5 g/dL (ref 3.9–4.9)
Alkaline Phosphatase: 97 [IU]/L (ref 47–123)
BUN/Creatinine Ratio: 18 (ref 10–24)
BUN: 20 mg/dL (ref 8–27)
Bilirubin Total: 0.3 mg/dL (ref 0.0–1.2)
CO2: 22 mmol/L (ref 20–29)
Calcium: 9.5 mg/dL (ref 8.6–10.2)
Chloride: 107 mmol/L — AB (ref 96–106)
Creatinine, Ser: 1.11 mg/dL (ref 0.76–1.27)
Globulin, Total: 2.3 g/dL (ref 1.5–4.5)
Glucose: 111 mg/dL — AB (ref 70–99)
Potassium: 4.5 mmol/L (ref 3.5–5.2)
Sodium: 142 mmol/L (ref 134–144)
Total Protein: 6.8 g/dL (ref 6.0–8.5)
eGFR: 72 mL/min/{1.73_m2}

## 2024-07-25 LAB — LIPID PANEL
Cholesterol, Total: 129 mg/dL (ref 100–199)
HDL: 40 mg/dL
LDL CALC COMMENT:: 3.2 ratio (ref 0.0–5.0)
LDL Chol Calc (NIH): 67 mg/dL (ref 0–99)
Triglycerides: 124 mg/dL (ref 0–149)
VLDL Cholesterol Cal: 22 mg/dL (ref 5–40)

## 2024-07-25 LAB — ALPHA-GAL PANEL

## 2024-07-25 LAB — HEMOGLOBIN A1C
Est. average glucose Bld gHb Est-mCnc: 120 mg/dL
Hgb A1c MFr Bld: 5.8 % — ABNORMAL HIGH (ref 4.8–5.6)

## 2024-07-29 ENCOUNTER — Other Ambulatory Visit

## 2025-01-21 ENCOUNTER — Encounter: Admitting: Nurse Practitioner

## 2025-05-27 ENCOUNTER — Ambulatory Visit
